# Patient Record
Sex: Female | Born: 1950 | Race: White | Hispanic: No | Marital: Single | Smoking: Never smoker
Health system: Southern US, Community
[De-identification: ages and names within clinical notes are randomized; demographics above are authoritative.]

## PROBLEM LIST (undated history)

## (undated) DIAGNOSIS — B019 Varicella without complication: Secondary | ICD-10-CM

## (undated) DIAGNOSIS — K5792 Diverticulitis of intestine, part unspecified, without perforation or abscess without bleeding: Secondary | ICD-10-CM

## (undated) DIAGNOSIS — F32A Depression, unspecified: Secondary | ICD-10-CM

## (undated) DIAGNOSIS — M1990 Unspecified osteoarthritis, unspecified site: Secondary | ICD-10-CM

## (undated) DIAGNOSIS — K5790 Diverticulosis of intestine, part unspecified, without perforation or abscess without bleeding: Secondary | ICD-10-CM

## (undated) DIAGNOSIS — E785 Hyperlipidemia, unspecified: Secondary | ICD-10-CM

## (undated) DIAGNOSIS — F419 Anxiety disorder, unspecified: Secondary | ICD-10-CM

## (undated) DIAGNOSIS — T7840XA Allergy, unspecified, initial encounter: Secondary | ICD-10-CM

## (undated) HISTORY — DX: Depression, unspecified: F32.A

## (undated) HISTORY — DX: Varicella without complication: B01.9

## (undated) HISTORY — DX: Diverticulosis of intestine, part unspecified, without perforation or abscess without bleeding: K57.90

## (undated) HISTORY — PX: AUGMENTATION MAMMAPLASTY: SUR837

## (undated) HISTORY — DX: Allergy, unspecified, initial encounter: T78.40XA

## (undated) HISTORY — DX: Unspecified osteoarthritis, unspecified site: M19.90

## (undated) HISTORY — DX: Anxiety disorder, unspecified: F41.9

## (undated) HISTORY — DX: Diverticulitis of intestine, part unspecified, without perforation or abscess without bleeding: K57.92

## (undated) HISTORY — DX: Hyperlipidemia, unspecified: E78.5

---

## 2011-07-07 DIAGNOSIS — I890 Lymphedema, not elsewhere classified: Secondary | ICD-10-CM | POA: Insufficient documentation

## 2012-02-09 DIAGNOSIS — M779 Enthesopathy, unspecified: Secondary | ICD-10-CM | POA: Insufficient documentation

## 2012-04-14 DIAGNOSIS — F329 Major depressive disorder, single episode, unspecified: Secondary | ICD-10-CM | POA: Insufficient documentation

## 2012-04-14 DIAGNOSIS — N951 Menopausal and female climacteric states: Secondary | ICD-10-CM | POA: Insufficient documentation

## 2012-04-14 DIAGNOSIS — B351 Tinea unguium: Secondary | ICD-10-CM | POA: Insufficient documentation

## 2014-04-22 ENCOUNTER — Encounter (HOSPITAL_BASED_OUTPATIENT_CLINIC_OR_DEPARTMENT_OTHER): Payer: Self-pay | Admitting: Emergency Medicine

## 2014-04-22 ENCOUNTER — Emergency Department (HOSPITAL_BASED_OUTPATIENT_CLINIC_OR_DEPARTMENT_OTHER)
Admission: EM | Admit: 2014-04-22 | Discharge: 2014-04-22 | Disposition: A | Discharge status: Home / Self Care | Payer: 59 | Attending: Emergency Medicine | Admitting: Emergency Medicine

## 2014-04-22 DIAGNOSIS — M545 Low back pain, unspecified: Secondary | ICD-10-CM | POA: Insufficient documentation

## 2014-04-22 DIAGNOSIS — M549 Dorsalgia, unspecified: Secondary | ICD-10-CM

## 2014-04-22 DIAGNOSIS — R1031 Right lower quadrant pain: Secondary | ICD-10-CM | POA: Insufficient documentation

## 2014-04-22 LAB — URINALYSIS, ROUTINE W REFLEX MICROSCOPIC
BILIRUBIN URINE: NEGATIVE
GLUCOSE, UA: NEGATIVE mg/dL
Hgb urine dipstick: NEGATIVE
KETONES UR: NEGATIVE mg/dL
Leukocytes, UA: NEGATIVE
Nitrite: NEGATIVE
PH: 7 (ref 5.0–8.0)
Protein, ur: NEGATIVE mg/dL
SPECIFIC GRAVITY, URINE: 1.005 (ref 1.005–1.030)
Urobilinogen, UA: 0.2 mg/dL (ref 0.0–1.0)

## 2014-04-22 NOTE — Discharge Instructions (Signed)
Back Pain, Adult Low back pain is very common. About 1 in 5 people have back pain.The cause of low back pain is rarely dangerous. The pain often gets better over time.About half of people with a sudden onset of back pain feel better in just 2 weeks. About 8 in 10 people feel better by 6 weeks.  CAUSES Some common causes of back pain include:  Strain of the muscles or ligaments supporting the spine.  Wear and tear (degeneration) of the spinal discs.  Arthritis.  Direct injury to the back. DIAGNOSIS Most of the time, the direct cause of low back pain is not known.However, back pain can be treated effectively even when the exact cause of the pain is unknown.Answering your caregiver's questions about your overall health and symptoms is one of the most accurate ways to make sure the cause of your pain is not dangerous. If your caregiver needs more information, he or she may order lab work or imaging tests (X-rays or MRIs).However, even if imaging tests show changes in your back, this usually does not require surgery. HOME CARE INSTRUCTIONS For many people, back pain returns.Since low back pain is rarely dangerous, it is often a condition that people can learn to manageon their own.   Remain active. It is stressful on the back to sit or stand in one place. Do not sit, drive, or stand in one place for more than 30 minutes at a time. Take short walks on level surfaces as soon as pain allows.Try to increase the length of time you walk each day.  Do not stay in bed.Resting more than 1 or 2 days can delay your recovery.  Do not avoid exercise or work.Your body is made to move.It is not dangerous to be active, even though your back may hurt.Your back will likely heal faster if you return to being active before your pain is gone.  Pay attention to your body when you bend and lift. Many people have less discomfortwhen lifting if they bend their knees, keep the load close to their bodies,and  avoid twisting. Often, the most comfortable positions are those that put less stress on your recovering back.  Find a comfortable position to sleep. Use a firm mattress and lie on your side with your knees slightly bent. If you lie on your back, put a pillow under your knees.  Only take over-the-counter or prescription medicines as directed by your caregiver. Over-the-counter medicines to reduce pain and inflammation are often the most helpful.Your caregiver may prescribe muscle relaxant drugs.These medicines help dull your pain so you can more quickly return to your normal activities and healthy exercise.  Put ice on the injured area.  Put ice in a plastic bag.  Place a towel between your skin and the bag.  Leave the ice on for 15-20 minutes, 03-04 times a day for the first 2 to 3 days. After that, ice and heat may be alternated to reduce pain and spasms.  Ask your caregiver about trying back exercises and gentle massage. This may be of some benefit.  Avoid feeling anxious or stressed.Stress increases muscle tension and can worsen back pain.It is important to recognize when you are anxious or stressed and learn ways to manage it.Exercise is a great option. SEEK MEDICAL CARE IF:  You have pain that is not relieved with rest or medicine.  You have pain that does not improve in 1 week.  You have new symptoms.  You are generally not feeling well. SEEK   IMMEDIATE MEDICAL CARE IF:   You have pain that radiates from your back into your legs.  You develop new bowel or bladder control problems.  You have unusual weakness or numbness in your arms or legs.  You develop nausea or vomiting.  You develop abdominal pain.  You feel faint. Document Released: 11/17/2005 Document Revised: 05/18/2012 Document Reviewed: 04/07/2011 [employer] Patient Information 2014 [employer].  

## 2014-04-22 NOTE — ED Notes (Signed)
TV on, Warm Blanket Provided.

## 2014-04-22 NOTE — ED Notes (Signed)
Pt reports urinary frequency, and (L) flank pain x 1 week.  deneis fever.

## 2014-04-22 NOTE — ED Provider Notes (Signed)
CSN: [id number]     Arrival date & time 04/22/14  1204 History   First MD Initiated Contact with Patient 04/22/14 1232     Chief Complaint  Patient presents with  . Back Pain     (Consider location/radiation/quality/duration/timing/severity/associated sxs/prior Treatment) Patient is a 63 y.o. female presenting with back pain.  Back Pain Location:  Lumbar spine Quality:  Aching Radiates to: left low back. Pain severity:  Moderate Onset quality:  Gradual Duration:  6 days Timing:  Constant Progression:  Worsening Chronicity:  New Context: not falling, not MVA and not recent injury   Relieved by:  NSAIDs, heating pad and being still Worsened by:  Movement Associated symptoms: abdominal pain   Associated symptoms: no bladder incontinence, no bowel incontinence, no chest pain, no dysuria, no fever, no numbness, no perianal numbness and no tingling     History reviewed. No pertinent past medical history. History reviewed. No pertinent past surgical history. History reviewed. No pertinent family history. History  Substance Use Topics  . Smoking status: Never Smoker   . Smokeless tobacco: Not on file  . Alcohol Use: No   OB History   Grav Para Term Preterm Abortions TAB SAB Ect Mult Living                 Review of Systems  Constitutional: Negative for fever.  HENT: Negative for congestion.   Respiratory: Negative for cough and shortness of breath.   Cardiovascular: Negative for chest pain.  Gastrointestinal: Positive for abdominal pain. Negative for nausea, vomiting, diarrhea and bowel incontinence.  Genitourinary: Negative for bladder incontinence and dysuria.  Musculoskeletal: Positive for back pain.  Neurological: Negative for tingling and numbness.  All other systems reviewed and are negative.     Allergies  Review of patient's allergies indicates no known allergies.  Home Medications   Prior to Admission medications   Not on File   BP 130/66  Pulse 82   Temp(Src) 98.6 F (37 C) (Oral)  Resp 16  Ht 5\' 2"  (1.575 m)  Wt 120 lb (54.432 kg)  BMI 21.94 kg/m2  SpO2 100% Physical Exam  Nursing note and vitals reviewed. Constitutional: She is oriented to person, place, and time. She appears well-developed and well-nourished. No distress.  HENT:  Head: Normocephalic and atraumatic.  Mouth/Throat: Oropharynx is clear and moist.  Eyes: Conjunctivae are normal. Pupils are equal, round, and reactive to light. No scleral icterus.  Neck: Neck supple.  Cardiovascular: Normal rate, regular rhythm, normal heart sounds and intact distal pulses.   No murmur heard. Pulmonary/Chest: Effort normal and breath sounds normal. No stridor. No respiratory distress. She has no rales.  Abdominal: Soft. Bowel sounds are normal. She exhibits no distension. There is no tenderness. There is no rigidity, no rebound and no guarding.  Musculoskeletal: Normal range of motion.       Lumbar back: She exhibits normal range of motion, no tenderness, no bony tenderness, no swelling, no edema and no deformity.  Neurological: She is alert and oriented to person, place, and time.  Skin: Skin is warm and dry. No rash noted.  Psychiatric: She has a normal mood and affect. Her behavior is normal.    ED Course  Procedures (including critical care time) Labs Review Labs Reviewed  URINALYSIS, ROUTINE W REFLEX MICROSCOPIC    Imaging Review No results found.   EKG Interpretation None      MDM   Final diagnoses:  Back pain    63 yo female with  about 6 days of low back pain.  Began as midline pain, then moved to left low back.  She has also begun to have some RLQ abdominal cramping pain.  On exam, well appearing, not distressed, no back TTP, minimal epigastric abdominal TTP, but no lower abd TTP.  UA negative for blood or infection.  No red flag signs or symptoms for significant back pain pathology.  Normal gait and strength.  Exam very reassuring.  We discussed possible  emergent etiologies of pain and discussed lab testing and imaging.  Through mutual decision making, we decided not to pursue further testing at this time, but for her to monitor symptoms, return if worsening, and follow up with PCP.      Houston Siren III, MD 04/22/14 [phone]

## 2017-01-02 DIAGNOSIS — L570 Actinic keratosis: Secondary | ICD-10-CM | POA: Diagnosis not present

## 2017-01-02 DIAGNOSIS — L820 Inflamed seborrheic keratosis: Secondary | ICD-10-CM | POA: Diagnosis not present

## 2017-01-02 DIAGNOSIS — L814 Other melanin hyperpigmentation: Secondary | ICD-10-CM | POA: Diagnosis not present

## 2017-01-02 DIAGNOSIS — D235 Other benign neoplasm of skin of trunk: Secondary | ICD-10-CM | POA: Diagnosis not present

## 2017-01-02 DIAGNOSIS — L821 Other seborrheic keratosis: Secondary | ICD-10-CM | POA: Diagnosis not present

## 2017-02-12 DIAGNOSIS — N760 Acute vaginitis: Secondary | ICD-10-CM | POA: Diagnosis not present

## 2017-02-12 DIAGNOSIS — B9689 Other specified bacterial agents as the cause of diseases classified elsewhere: Secondary | ICD-10-CM | POA: Diagnosis not present

## 2017-02-12 DIAGNOSIS — R8290 Unspecified abnormal findings in urine: Secondary | ICD-10-CM | POA: Diagnosis not present

## 2017-02-12 DIAGNOSIS — E785 Hyperlipidemia, unspecified: Secondary | ICD-10-CM | POA: Insufficient documentation

## 2017-02-20 DIAGNOSIS — Z0000 Encounter for general adult medical examination without abnormal findings: Secondary | ICD-10-CM | POA: Diagnosis not present

## 2017-02-21 LAB — BASIC METABOLIC PANEL
BUN: 12 (ref 4–21)
CREATININE: 0.7 (ref 0.5–1.1)
Glucose: 90
POTASSIUM: 4.5 (ref 3.4–5.3)
SODIUM: 141 (ref 137–147)

## 2017-02-21 LAB — CBC AND DIFFERENTIAL
HCT: 43 (ref 36–46)
Hemoglobin: 14.7 (ref 12.0–16.0)
PLATELETS: 296 (ref 150–399)
WBC: 6

## 2017-02-21 LAB — HEPATIC FUNCTION PANEL
ALT: 28 (ref 7–35)
AST: 28 (ref 13–35)
Alkaline Phosphatase: 91 (ref 25–125)
Bilirubin, Total: 0.4

## 2017-02-21 LAB — LIPID PANEL
Cholesterol: 229 — AB (ref 0–200)
HDL: 83 — AB (ref 35–70)
LDL CALC: 126
Triglycerides: 73 (ref 40–160)

## 2017-02-21 LAB — TSH: TSH: 1.82 (ref 0.41–5.90)

## 2017-02-22 DIAGNOSIS — J111 Influenza due to unidentified influenza virus with other respiratory manifestations: Secondary | ICD-10-CM | POA: Diagnosis not present

## 2017-02-22 DIAGNOSIS — R05 Cough: Secondary | ICD-10-CM | POA: Diagnosis not present

## 2017-02-24 DIAGNOSIS — R05 Cough: Secondary | ICD-10-CM | POA: Diagnosis not present

## 2017-02-24 DIAGNOSIS — B9789 Other viral agents as the cause of diseases classified elsewhere: Secondary | ICD-10-CM | POA: Diagnosis not present

## 2017-02-24 DIAGNOSIS — J069 Acute upper respiratory infection, unspecified: Secondary | ICD-10-CM | POA: Diagnosis not present

## 2017-03-26 DIAGNOSIS — K5900 Constipation, unspecified: Secondary | ICD-10-CM | POA: Diagnosis not present

## 2017-03-26 DIAGNOSIS — K644 Residual hemorrhoidal skin tags: Secondary | ICD-10-CM | POA: Diagnosis not present

## 2017-03-26 DIAGNOSIS — L290 Pruritus ani: Secondary | ICD-10-CM | POA: Diagnosis not present

## 2017-04-24 ENCOUNTER — Ambulatory Visit: Payer: Self-pay | Admitting: Podiatry

## 2017-06-10 ENCOUNTER — Ambulatory Visit (INDEPENDENT_AMBULATORY_CARE_PROVIDER_SITE_OTHER): Payer: BLUE CROSS/BLUE SHIELD

## 2017-06-10 ENCOUNTER — Ambulatory Visit (INDEPENDENT_AMBULATORY_CARE_PROVIDER_SITE_OTHER): Payer: BLUE CROSS/BLUE SHIELD | Admitting: Podiatry

## 2017-06-10 ENCOUNTER — Encounter: Payer: Self-pay | Admitting: Podiatry

## 2017-06-10 VITALS — BP 129/86

## 2017-06-10 DIAGNOSIS — M779 Enthesopathy, unspecified: Secondary | ICD-10-CM | POA: Diagnosis not present

## 2017-06-10 DIAGNOSIS — M722 Plantar fascial fibromatosis: Secondary | ICD-10-CM | POA: Diagnosis not present

## 2017-06-10 DIAGNOSIS — M79672 Pain in left foot: Secondary | ICD-10-CM

## 2017-06-10 DIAGNOSIS — M79671 Pain in right foot: Secondary | ICD-10-CM

## 2017-06-10 MED ORDER — TRIAMCINOLONE ACETONIDE 10 MG/ML IJ SUSP
10.0000 mg | Freq: Once | INTRAMUSCULAR | Status: AC
  Administered 2017-06-10: 10 mg

## 2017-06-10 NOTE — Progress Notes (Signed)
Subjective:    Patient ID: Danielle Fitzpatrick, female   DOB: 66 y.o.   MRN: [id number]   HPI patient states that she has had a history of foot surgery bilateral and she's developed a lot of pain in the plantar right heel and the dorsum of the left foot and also knows that she has flatfoot and had orthotics years ago that she did not like    Review of Systems  All other systems reviewed and are negative.       Objective:  Physical Exam  Cardiovascular: Intact distal pulses.   Musculoskeletal: Normal range of motion.  Neurological: She is alert.  Skin: Skin is warm.  Nursing note and vitals reviewed.  neurovascular status intact muscle strength adequate range of motion within normal limits with patient noted to have quite a bit of discomfort in the center center lateral aspect of the plantar fascia right at its insertion calcaneus with the left showing inflammation of the midtarsal joint dorsal with fluid buildup noted around the midtarsal joint. Patient's found have good digital perfusion well oriented 3     Assessment:   Acute plantar fasciitis right mid and central band with patient noted to have dorsal tendinitis left midfoot that's painful when palpated     Plan:  H&P conditions reviewed and I injected the plantar fascia right 3 mg Kenalog 5 mg Xylocaine applied fascial brace right and for the left I injected the dorsal tendon complex extensor 3 mg Kenalog 5 mg Xylocaine advised on heat ice therapy discussed possibility for a very soft orthotic and reappoint in 3 weeks to reevaluate  X-rays indicate there is plantar spur there is depression of the arch bilateral with history of bunionectomy bilateral and 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

## 2017-06-10 NOTE — Progress Notes (Signed)
   Subjective:    Patient ID: Danielle Fitzpatrick, female    DOB: 1951/10/31, 66 y.o.   MRN: [id number]  HPI  Chief Complaint  Patient presents with  . Foot Pain    Lt top of the and heel pain for 1 month.     Review of Systems  All other systems reviewed and are negative.      Objective:   Physical Exam        Assessment & Plan:

## 2017-06-17 DIAGNOSIS — R600 Localized edema: Secondary | ICD-10-CM | POA: Insufficient documentation

## 2017-06-17 DIAGNOSIS — K648 Other hemorrhoids: Secondary | ICD-10-CM | POA: Insufficient documentation

## 2017-06-17 DIAGNOSIS — F419 Anxiety disorder, unspecified: Secondary | ICD-10-CM | POA: Insufficient documentation

## 2017-10-01 ENCOUNTER — Ambulatory Visit (INDEPENDENT_AMBULATORY_CARE_PROVIDER_SITE_OTHER): Payer: BLUE CROSS/BLUE SHIELD | Admitting: Podiatry

## 2017-10-01 DIAGNOSIS — M722 Plantar fascial fibromatosis: Secondary | ICD-10-CM

## 2017-10-01 DIAGNOSIS — M779 Enthesopathy, unspecified: Secondary | ICD-10-CM | POA: Diagnosis not present

## 2017-10-01 NOTE — Progress Notes (Signed)
Subjective:    Patient ID: Danielle Fitzpatrick, female   DOB: 66 y.o.   MRN: [id number]   HPI patient presents stating I started develop pain in my right heel again over the last month the top of my left foot has been doing well    ROS      Objective:  Physical Exam neurovascular status intact with discomfort in the plantar heel right that's moderate in its intensity with diminished fat pad and a thin heel and dorsum left foot doing okay     Assessment:    Plantar fasciitis reoccurrence right     Plan:    Discussed customized orthotic devices which I think will be of great benefit to her and at this point reviewed what would be required have recommended a very soft type orthotic. She will have molds done after insurance is contact

## 2017-10-08 ENCOUNTER — Telehealth: Payer: Self-pay | Admitting: Podiatry

## 2017-10-08 NOTE — Telephone Encounter (Signed)
Left message for pt to call to discuss orthotic benefits.  Pt called back and was given the coverage information and is going to think about it and let me know. She said she has tried over the counter and they hurt her feet. I also told pt if she decides she does not want them to schedule an appt with Dr Paulla Dolly to see if he may have another option for her.She agreed to call back and let me know.

## 2017-11-03 DIAGNOSIS — L578 Other skin changes due to chronic exposure to nonionizing radiation: Secondary | ICD-10-CM | POA: Diagnosis not present

## 2017-11-03 DIAGNOSIS — L570 Actinic keratosis: Secondary | ICD-10-CM | POA: Diagnosis not present

## 2018-01-20 DIAGNOSIS — D485 Neoplasm of uncertain behavior of skin: Secondary | ICD-10-CM | POA: Diagnosis not present

## 2018-01-20 DIAGNOSIS — C44629 Squamous cell carcinoma of skin of left upper limb, including shoulder: Secondary | ICD-10-CM | POA: Diagnosis not present

## 2018-01-20 DIAGNOSIS — D225 Melanocytic nevi of trunk: Secondary | ICD-10-CM | POA: Diagnosis not present

## 2018-01-20 DIAGNOSIS — L820 Inflamed seborrheic keratosis: Secondary | ICD-10-CM | POA: Diagnosis not present

## 2018-02-16 DIAGNOSIS — C44629 Squamous cell carcinoma of skin of left upper limb, including shoulder: Secondary | ICD-10-CM | POA: Diagnosis not present

## 2018-02-16 DIAGNOSIS — L820 Inflamed seborrheic keratosis: Secondary | ICD-10-CM | POA: Diagnosis not present

## 2018-03-16 ENCOUNTER — Telehealth: Payer: Self-pay | Admitting: Family Medicine

## 2018-03-16 NOTE — Telephone Encounter (Signed)
Received records from pt as she will be coming to establish care with [place].  These records are from [facility] so some records available in [place]

## 2018-03-19 NOTE — Progress Notes (Signed)
[place] [facility] [address], [place], [place] [address] [phone]  Date:  03/22/2018   Name:  Danielle Fitzpatrick   DOB:  [birth year]   MRN:  [id number]  PCP:  Darreld Mclean, MD    Chief Complaint: New Patient (Initial Visit) (establish care); Ear Issues (left ear, feels very senstaive to noise); and Vaginal Issues (frequent yeast infections/bacterial vaginosis)   History of Present Illness:  Danielle Fitzpatrick is a 67 y.o. very pleasant female patient who presents with the following:  Here today as a new patient, I got her old records from [place] last week  Not a year since her last physical so this is an establish care visit   She liked her last MD decided to start coming here as the location is convenient for her- she lives nearby  Former pt of Dr. Benjamine Mola, per her last note:  2. Anxiety Improved on higher dose without side effects Patient would like to be able to adjust dose up and down according to her stress level Discussed how to do that - FLUoxetine (PROZAC) 10 MG capsule; One tab twice per day Dispense: 60 capsule; Refill: 3 3. Encounter for gynecological examination/Screening for cervical cancer Pap smear sent - Liquid Based Pap,Cervical,With HPV Testi 4. Post-menopausal atrophic vaginitis Use and side effects discussed, sample given - conjugated estrogens (PREMARIN) 0.625 mg/gram vaginal cream; Apply as instructed Dispense: 5 g; Refill: 0 If desires a prescription can call the office, recommended that she check her formulary to see which preparation is preferred on her insurance   She plans to continue working for several years more, she is an Chiropractor.  She works for [employer] here in [place] Both of her adult children live in this area, she has 4 grands ages 74, 52, and 54 yo twins.  She really loves being near to her family and spends time with her grands in her free time. She also enjoys gardening   She takes  spiro 50 three times a week for swelling, not for HTN She is on prozac for stress- she had to change jobs recently and has found that prozac really helped her.  She usually take 10 mg a day but sometimes will use 20 depending on her stress level   She is due for a mammogram and dexa scan; will arrange for her She plans to come in for a CPE later this summer She was given some premarin cream in the past but did not really take it.  She is bothered by vaginal dryness and sensitivity, and some odor. She is not SA No history of cancer, DVT/ PE or post- menopausal bleeding. Discussed a low dose vaginal estrogen and she is interested in using this.  Discussed risks of systemic estrogen and the lack of RCTs proving that vaginal estrogen is safe- however we believe it does not carry the same risks due to lack of elevation of serum estrogen with low dose local estrogen   There are no active problems to display for this patient.   Past Medical History:  Diagnosis Date  . Anxiety   . Chicken pox   . Hyperlipidemia     No past surgical history on file.  Social History   Tobacco Use  . Smoking status: Never Smoker  . Smokeless tobacco: Never Used  Substance Use Topics  . Alcohol use: No  . Drug use: No    No family history on  file.  No Known Allergies  Medication list has been reviewed and updated.  Current Outpatient Medications on File Prior to Visit  Medication Sig Dispense Refill  . aspirin EC 81 MG tablet Take 81 mg by mouth.    . B Complex Vitamins (VITAMIN-B COMPLEX PO) Take by mouth.    . Cholecalciferol (VITAMIN D) 2000 units CAPS Take by mouth.    Marland Kitchen FLUoxetine (PROZAC) 10 MG capsule TAKE 1 CAPSULE BY MOUTH TWICE DAILY    . Magnesium 300 MG CAPS Take by mouth.    . Probiotic Product (PROBIOTIC-10 PO) Take by mouth.    . spironolactone (ALDACTONE) 50 MG tablet spironolactone 50 mg tablet three times weekly     No current facility-administered medications on file prior to  visit.     Review of Systems:  As per HPI- otherwise negative. No fever or chills   Physical Examination: Vitals:   03/22/18 1458  BP: 105/70  Pulse: 77  Resp: 16  SpO2: 94%   Vitals:   03/22/18 1458  Weight: 131 lb 3.2 oz (59.5 kg)  Height: 5' 1.5" (1.562 m)   Body mass index is 24.39 kg/m. Ideal Body Weight: Weight in (lb) to have BMI = 25: 134.2  GEN: WDWN, NAD, Non-toxic, A & O x 3, slim build, looks well  HEENT: Atraumatic, Normocephalic. Neck supple. No masses, No LAD.  Bilateral TM wnl, oropharynx normal.  PEERL,EOMI.   Ears and Nose: No external deformity. CV: RRR, No M/G/R. No JVD. No thrill. No extra heart sounds. PULM: CTA B, no wheezes, crackles, rhonchi. No retractions. No resp. distress. No accessory muscle use. ABD: S, NT, ND, +BS. No rebound. No HSM. EXTR: No c/c/e NEURO Normal gait.  PSYCH: Normally interactive. Conversant. Not depressed or anxious appearing.  Calm demeanor.    Assessment and Plan: Adjustment disorder, unspecified type - Plan: FLUoxetine (PROZAC) 10 MG capsule  Screening for breast cancer - Plan: MM DIAG BREAST TOMO BILATERAL  Screening for osteoporosis - Plan: DG Bone Density  Estrogen deficiency - Plan: DG Bone Density  Atrophic vaginitis - Plan: Estradiol 10 MCG TABS vaginal tablet  New patient here today to establish care and discuss a few concerns Ordered mammogram and dexa for her today Low dose vaginal estrogen for her to use as needed up to 2x a week Refilled her prozac She does not need a refill of her spiro yet   Signed Lamar Blinks, MD

## 2018-03-22 ENCOUNTER — Ambulatory Visit (INDEPENDENT_AMBULATORY_CARE_PROVIDER_SITE_OTHER): Payer: BLUE CROSS/BLUE SHIELD | Admitting: Family Medicine

## 2018-03-22 ENCOUNTER — Encounter: Payer: Self-pay | Admitting: Family Medicine

## 2018-03-22 ENCOUNTER — Ambulatory Visit: Payer: Managed Care, Other (non HMO) | Admitting: Family Medicine

## 2018-03-22 VITALS — BP 105/70 | HR 77 | Resp 16 | Ht 61.5 in | Wt 131.2 lb

## 2018-03-22 DIAGNOSIS — Z1231 Encounter for screening mammogram for malignant neoplasm of breast: Secondary | ICD-10-CM | POA: Diagnosis not present

## 2018-03-22 DIAGNOSIS — F4320 Adjustment disorder, unspecified: Secondary | ICD-10-CM | POA: Diagnosis not present

## 2018-03-22 DIAGNOSIS — Z13820 Encounter for screening for osteoporosis: Secondary | ICD-10-CM | POA: Diagnosis not present

## 2018-03-22 DIAGNOSIS — Z1239 Encounter for other screening for malignant neoplasm of breast: Secondary | ICD-10-CM

## 2018-03-22 DIAGNOSIS — N952 Postmenopausal atrophic vaginitis: Secondary | ICD-10-CM | POA: Diagnosis not present

## 2018-03-22 DIAGNOSIS — E2839 Other primary ovarian failure: Secondary | ICD-10-CM | POA: Diagnosis not present

## 2018-03-22 MED ORDER — ESTRADIOL 10 MCG VA TABS: ORAL_TABLET | VAGINAL | 6 refills | Status: DC

## 2018-03-22 MED ORDER — FLUOXETINE HCL 10 MG PO CAPS: 10.0000 mg | ORAL_CAPSULE | Freq: Two times a day (BID) | ORAL | 3 refills | Status: DC

## 2018-03-22 NOTE — Patient Instructions (Addendum)
[facility] is my dentist and I love them!  It was very nice to meet you today I will set you up for a mammogram and bone density scan We will look for you for a physical in July or August For vaginal comfort, we will have you try a low dose vaginal estrogen.  Place 1 pill twice a week, and you can decrease to once a week if you like.

## 2018-03-25 ENCOUNTER — Encounter: Payer: Self-pay | Admitting: Family Medicine

## 2018-04-05 ENCOUNTER — Encounter: Payer: Self-pay | Admitting: Family Medicine

## 2018-06-18 NOTE — Progress Notes (Addendum)
[facility] [address] [place], [place] [address] [phone]  Date:  06/21/2018   Name:  Danielle Fitzpatrick   DOB:  [name] 31, 1951   MRN:  [id number]  PCP:  Darreld Mclean, MD    Chief Complaint: Annual Exam (needing lab work, paper filled out for work) and Gastroesophageal Reflux (indegestion, pain in jaw and right arm, 2 weeks ago, one episode)   History of Present Illness:  Danielle Fitzpatrick is a 67 y.o. very pleasant female patient who presents with the following:  Here today for a CPE Last seen here in April to establish care: She plans to continue working for several years more, she is an Chiropractor.  She works for [employer] here in [place] Both of her adult children live in this area, she has 4 grands ages 72, 43, and 1 yo twins.  She really loves being near to her family and spends time with her grands in her free time. She also enjoys gardening   She takes spiro 50 three times a week for swelling, not for HTN She is on prozac for stress- she had to change jobs recently and has found that prozac really helped her.  She usually take 10 mg a day but sometimes will use 20 depending on her stress level   She is due for a mammogram and dexa scan; will arrange for her She plans to come in for a CPE later this summer She was given some premarin cream in the past but did not really take it.  She is bothered by vaginal dryness and sensitivity, and some odor. She is not SA No history of cancer, DVT/ PE or post- menopausal bleeding. Discussed a low dose vaginal estrogen and she is interested in using this.  Discussed risks of systemic estrogen and the lack of RCTs proving that vaginal estrogen is safe- however we believe it does not carry the same risks due to lack of elevation of serum estrogen with low dose local estrogen   Mammo: today  Pap: done last year- she was told that this is no longer necessary  Colon: would like  to do cologuard Dexa: done today  Immun: prevnar due today Tetanus is UTD, she has zostavax in 2014 Labs:  Will do today  She is using vaginal estradiol a couple of times a week and does feel like it helps her She notes that about 2 weeks ago she was sitting at her desk when she started to feel "indigestion" which was an epigastric pain and that went up into her chest.  She stood up to try and relieve pressure She had pain into her right arm and jaw- which lasted only seconds  This has not happened again and the episode lasted a few seconds overall No history of exertional chest pain   She does walk about 20 minutes daily- however this is not that strenuous Otherwise no formal exercise program  No CP or SOB Her father has had strokes but no other known history of heart disease in her family  She had been taking spiro 50 three times a week for water retention- however she would like to try coming off this which is fine, she does not think she really needs it anymore   There have been some changes at her job and she is under less stress which is a positive for her  There are no active problems to display for  this patient.   Past Medical History:  Diagnosis Date  . Anxiety   . Chicken pox   . Hyperlipidemia     Past Surgical History:  Procedure Laterality Date  . AUGMENTATION MAMMAPLASTY      Social History   Tobacco Use  . Smoking status: Never Smoker  . Smokeless tobacco: Never Used  Substance Use Topics  . Alcohol use: Yes    Comment: occasionally  . Drug use: No    Family History  Problem Relation Age of Onset  . Depression Mother   . Hyperlipidemia Mother   . Hypertension Mother   . Hypertension Father   . Hyperlipidemia Father   . Heart disease Father   . Hearing loss Father   . Diabetes Father   . Depression Father   . Depression Sister   . Alcohol abuse Brother   . Depression Brother   . Early death Brother   . Heart attack Brother   . Hypertension  Brother   . Hyperlipidemia Daughter   . Hearing loss Maternal Grandmother   . Diabetes Maternal Grandfather   . Early death Maternal Grandfather   . Heart disease Maternal Grandfather   . Alcohol abuse Paternal Grandmother   . Early death Paternal Grandmother     No Known Allergies  Medication list has been reviewed and updated.  Current Outpatient Medications on File Prior to Visit  Medication Sig Dispense Refill  . aspirin EC 81 MG tablet Take 81 mg by mouth.    . B Complex Vitamins (VITAMIN-B COMPLEX PO) Take by mouth.    . Cholecalciferol (VITAMIN D) 2000 units CAPS Take by mouth.    . Estradiol 10 MCG TABS vaginal tablet Place 1 tablet vaginally up to 2x a week as needed to maintain vaginal comfort 8 tablet 6  . FLUoxetine (PROZAC) 10 MG capsule Take 1 capsule (10 mg total) by mouth 2 (two) times daily. 180 capsule 3  . Magnesium 300 MG CAPS Take by mouth.    . Probiotic Product (PROBIOTIC-10 PO) Take by mouth.    . spironolactone (ALDACTONE) 50 MG tablet spironolactone 50 mg tablet three times weekly     No current facility-administered medications on file prior to visit.     Review of Systems:  As per HPI- otherwise negative. No fever or chills Vaginal dryness is much better with her vaginal estrogen use    Physical Examination: Vitals:   06/21/18 0909  BP: 116/78  Pulse: 72  Resp: 16  Temp: 97.9 F (36.6 C)  SpO2: 99%   Vitals:   06/21/18 0909  Weight: 127 lb 12.8 oz (58 kg)  Height: 5' 1.5" (1.562 m)   Body mass index is 23.76 kg/m. Ideal Body Weight: Weight in (lb) to have BMI = 25: 134.2  GEN: WDWN, NAD, Non-toxic, A & O x 3, normal weight, looks well  HEENT: Atraumatic, Normocephalic. Neck supple. No masses, No LAD.  Bilateral TM wnl, oropharynx normal.  PEERL,EOMI.   Ears and Nose: No external deformity. CV: RRR, No M/G/R. No JVD. No thrill. No extra heart sounds. PULM: CTA B, no wheezes, crackles, rhonchi. No retractions. No resp. distress. No  accessory muscle use. ABD: S, NT, ND. No rebound. No HSM. EXTR: No c/c/e NEURO Normal gait.  PSYCH: Normally interactive. Conversant. Not depressed or anxious appearing.  Calm demeanor.   EKG: NSR, no ST changes or other concerning findings  Assessment and Plan: Physical exam  Immunization due - Plan: Pneumococcal conjugate vaccine 13-valent  IM  Encounter for hepatitis C screening test for low risk patient - Plan: Hepatitis C antibody  Screening for deficiency anemia - Plan: CBC  Screening for diabetes mellitus - Plan: Comprehensive metabolic panel, Hemoglobin A1c  Screening for hyperlipidemia - Plan: Lipid panel  Chest pain, unspecified type - Plan: EKG 12-Lead, Exercise Tolerance Test   will need to fax in health form for her when labs are in  CPE today Updated pneumonia vaccine Labs pending as above She had an episode of very atypical CP a couple of week ago- this was the only episode she has experienced. EKG today is normal Will set her up for a ETT for further risk stratification   Signed Lamar Blinks, MD  Results for orders placed or performed in visit on 06/21/18  CBC  Result Value Ref Range   WBC 5.1 4.0 - 10.5 K/uL   RBC 4.51 3.87 - 5.11 Mil/uL   Platelets 275.0 150.0 - 400.0 K/uL   Hemoglobin 14.3 12.0 - 15.0 g/dL   HCT 42.0 36.0 - 46.0 %   MCV 93.1 78.0 - 100.0 fl   MCHC 34.1 30.0 - 36.0 g/dL   RDW 12.9 11.5 - 15.5 %  Comprehensive metabolic panel  Result Value Ref Range   Sodium 140 135 - 145 mEq/L   Potassium 4.3 3.5 - 5.1 mEq/L   Chloride 102 96 - 112 mEq/L   CO2 32 19 - 32 mEq/L   Glucose, Bld 91 70 - 99 mg/dL   BUN 11 6 - 23 mg/dL   Creatinine, Ser 0.66 0.40 - 1.20 mg/dL   Total Bilirubin 0.5 0.2 - 1.2 mg/dL   Alkaline Phosphatase 80 39 - 117 U/L   AST 16 0 - 37 U/L   ALT 15 0 - 35 U/L   Total Protein 6.7 6.0 - 8.3 g/dL   Albumin 4.4 3.5 - 5.2 g/dL   Calcium 9.5 8.4 - 10.5 mg/dL   GFR 95.37 >60.00 mL/min  Hemoglobin A1c  Result Value  Ref Range   Hgb A1c MFr Bld 5.6 4.6 - 6.5 %  Lipid panel  Result Value Ref Range   Cholesterol 290 (H) 0 - 200 mg/dL   Triglycerides 69.0 0.0 - 149.0 mg/dL   HDL 75.80 >39.00 mg/dL   VLDL 13.8 0.0 - 40.0 mg/dL   LDL Cholesterol 200 (H) 0 - 99 mg/dL   Total CHOL/HDL Ratio 4    NonHDL 213.79     Calculated 10 yr CV risk at 7% Message to pt   Blood counts are normal Metabolic profile looks fine A1c is normal- no sign of diabetes I'm afraid your cholesterol is high.  With these numbers I calculated your estimated 10 year risk of cardiovascular disease at 7%, which is not too bad but perhaps higher than we want to see at your (young) age.  I would suggest that we start you on a cholesterol medication if you are willing Let me know what you think?   Also, I saw that your form also requests a waist measurement. Can you please measure your waist and send me the result here?  Thank you!

## 2018-06-21 ENCOUNTER — Encounter (HOSPITAL_BASED_OUTPATIENT_CLINIC_OR_DEPARTMENT_OTHER): Payer: Self-pay

## 2018-06-21 ENCOUNTER — Ambulatory Visit (HOSPITAL_BASED_OUTPATIENT_CLINIC_OR_DEPARTMENT_OTHER): Payer: BLUE CROSS/BLUE SHIELD

## 2018-06-21 ENCOUNTER — Ambulatory Visit (HOSPITAL_BASED_OUTPATIENT_CLINIC_OR_DEPARTMENT_OTHER)
Admission: RE | Admit: 2018-06-21 | Discharge: 2018-06-21 | Disposition: A | Discharge status: Home / Self Care | Payer: BLUE CROSS/BLUE SHIELD | Source: Ambulatory Visit | Attending: Family Medicine | Admitting: Family Medicine

## 2018-06-21 ENCOUNTER — Encounter: Payer: Self-pay | Admitting: Family Medicine

## 2018-06-21 ENCOUNTER — Ambulatory Visit (INDEPENDENT_AMBULATORY_CARE_PROVIDER_SITE_OTHER): Payer: BLUE CROSS/BLUE SHIELD | Admitting: Family Medicine

## 2018-06-21 VITALS — BP 116/78 | HR 72 | Temp 97.9°F | Resp 16 | Ht 61.5 in | Wt 127.8 lb

## 2018-06-21 DIAGNOSIS — E2839 Other primary ovarian failure: Secondary | ICD-10-CM | POA: Insufficient documentation

## 2018-06-21 DIAGNOSIS — Z1231 Encounter for screening mammogram for malignant neoplasm of breast: Secondary | ICD-10-CM | POA: Insufficient documentation

## 2018-06-21 DIAGNOSIS — Z1159 Encounter for screening for other viral diseases: Secondary | ICD-10-CM | POA: Diagnosis not present

## 2018-06-21 DIAGNOSIS — Z131 Encounter for screening for diabetes mellitus: Secondary | ICD-10-CM

## 2018-06-21 DIAGNOSIS — Z0000 Encounter for general adult medical examination without abnormal findings: Secondary | ICD-10-CM | POA: Diagnosis not present

## 2018-06-21 DIAGNOSIS — Z13820 Encounter for screening for osteoporosis: Secondary | ICD-10-CM | POA: Diagnosis not present

## 2018-06-21 DIAGNOSIS — Z780 Asymptomatic menopausal state: Secondary | ICD-10-CM | POA: Diagnosis not present

## 2018-06-21 DIAGNOSIS — Z23 Encounter for immunization: Secondary | ICD-10-CM | POA: Diagnosis not present

## 2018-06-21 DIAGNOSIS — Z13220 Encounter for screening for lipoid disorders: Secondary | ICD-10-CM

## 2018-06-21 DIAGNOSIS — Z130 Encounter for screening for diseases of the blood and blood-forming organs and certain disorders involving the immune mechanism: Secondary | ICD-10-CM | POA: Diagnosis not present

## 2018-06-21 DIAGNOSIS — R079 Chest pain, unspecified: Secondary | ICD-10-CM | POA: Diagnosis not present

## 2018-06-21 DIAGNOSIS — Z1239 Encounter for other screening for malignant neoplasm of breast: Secondary | ICD-10-CM

## 2018-06-21 DIAGNOSIS — N952 Postmenopausal atrophic vaginitis: Secondary | ICD-10-CM | POA: Diagnosis not present

## 2018-06-21 DIAGNOSIS — M85852 Other specified disorders of bone density and structure, left thigh: Secondary | ICD-10-CM | POA: Diagnosis not present

## 2018-06-21 LAB — COMPREHENSIVE METABOLIC PANEL
ALT: 15 U/L (ref 0–35)
AST: 16 U/L (ref 0–37)
Albumin: 4.4 g/dL (ref 3.5–5.2)
Alkaline Phosphatase: 80 U/L (ref 39–117)
BUN: 11 mg/dL (ref 6–23)
CHLORIDE: 102 meq/L (ref 96–112)
CO2: 32 meq/L (ref 19–32)
CREATININE: 0.66 mg/dL (ref 0.40–1.20)
Calcium: 9.5 mg/dL (ref 8.4–10.5)
GFR: 95.37 mL/min (ref >60.00)
Glucose, Bld: 91 mg/dL (ref 70–99)
Potassium: 4.3 mEq/L (ref 3.5–5.1)
SODIUM: 140 meq/L (ref 135–145)
Total Bilirubin: 0.5 mg/dL (ref 0.2–1.2)
Total Protein: 6.7 g/dL (ref 6.0–8.3)

## 2018-06-21 LAB — CBC
HEMATOCRIT: 42 % (ref 36.0–46.0)
HEMOGLOBIN: 14.3 g/dL (ref 12.0–15.0)
MCHC: 34.1 g/dL (ref 30.0–36.0)
MCV: 93.1 fl (ref 78.0–100.0)
Platelets: 275 10*3/uL (ref 150.0–400.0)
RBC: 4.51 Mil/uL (ref 3.87–5.11)
RDW: 12.9 % (ref 11.5–15.5)
WBC: 5.1 10*3/uL (ref 4.0–10.5)

## 2018-06-21 LAB — LIPID PANEL
Cholesterol: 290 mg/dL — ABNORMAL HIGH (ref 0–200)
HDL: 75.8 mg/dL (ref >39.00)
LDL Cholesterol: 200 mg/dL — ABNORMAL HIGH (ref 0–99)
NonHDL: 213.79
Total CHOL/HDL Ratio: 4
Triglycerides: 69 mg/dL (ref 0.0–149.0)
VLDL: 13.8 mg/dL (ref 0.0–40.0)

## 2018-06-21 LAB — HEMOGLOBIN A1C: HEMOGLOBIN A1C: 5.6 % (ref 4.6–6.5)

## 2018-06-21 MED ORDER — ESTRADIOL 10 MCG VA TABS: ORAL_TABLET | VAGINAL | 6 refills | Status: DC

## 2018-06-21 NOTE — Patient Instructions (Addendum)
Good to see you Danielle Fitzpatrick!  I will be in touch with your labs and also your mammo/ bone density asap You got your prenar 13 pneumonia shot today- will do a pneumovax booster next year and then done I will arrange for a treadmill test for you to check on your heart in more detail In any other concerning symptoms in the meantime please alert me We will also set you up for cologuard to screen for colon cancer  Your EKG looks great!    Health Maintenance for Postmenopausal Women Menopause is a normal process in which your reproductive ability comes to an end. This process happens gradually over a span of months to years, usually between the ages of 52 and 52. Menopause is complete when you have missed 12 consecutive menstrual periods. It is important to talk with your health care provider about some of the most common conditions that affect postmenopausal women, such as heart disease, cancer, and bone loss (osteoporosis). Adopting a healthy lifestyle and getting preventive care can help to promote your health and wellness. Those actions can also lower your chances of developing some of these common conditions. What should I know about menopause? During menopause, you may experience a number of symptoms, such as:  Moderate-to-severe hot flashes.  Night sweats.  Decrease in sex drive.  Mood swings.  Headaches.  Tiredness.  Irritability.  Memory problems.  Insomnia.  Choosing to treat or not to treat menopausal changes is an individual decision that you make with your health care provider. What should I know about hormone replacement therapy and supplements? Hormone therapy products are effective for treating symptoms that are associated with menopause, such as hot flashes and night sweats. Hormone replacement carries certain risks, especially as you become older. If you are thinking about using estrogen or estrogen with progestin treatments, discuss the benefits and risks with your health  care provider. What should I know about heart disease and stroke? Heart disease, heart attack, and stroke become more likely as you age. This may be due, in part, to the hormonal changes that your body experiences during menopause. These can affect how your body processes dietary fats, triglycerides, and cholesterol. Heart attack and stroke are both medical emergencies. There are many things that you can do to help prevent heart disease and stroke:  Have your blood pressure checked at least every 1-2 years. High blood pressure causes heart disease and increases the risk of stroke.  If you are 61-82 years old, ask your health care provider if you should take aspirin to prevent a heart attack or a stroke.  Do not use any tobacco products, including cigarettes, chewing tobacco, or electronic cigarettes. If you need help quitting, ask your health care provider.  It is important to eat a healthy diet and maintain a healthy weight. ? Be sure to include plenty of vegetables, fruits, low-fat dairy products, and lean protein. ? Avoid eating foods that are high in solid fats, added sugars, or salt (sodium).  Get regular exercise. This is one of the most important things that you can do for your health. ? Try to exercise for at least 150 minutes each week. The type of exercise that you do should increase your heart rate and make you sweat. This is known as moderate-intensity exercise. ? Try to do strengthening exercises at least twice each week. Do these in addition to the moderate-intensity exercise.  Know your numbers.Ask your health care provider to check your cholesterol and your blood glucose.  Continue to have your blood tested as directed by your health care provider.  What should I know about cancer screening? There are several types of cancer. Take the following steps to reduce your risk and to catch any cancer development as early as possible. Breast Cancer  Practice breast  self-awareness. ? This means understanding how your breasts normally appear and feel. ? It also means doing regular breast self-exams. Let your health care provider know about any changes, no matter how small.  If you are 33 or older, have a clinician do a breast exam (clinical breast exam or CBE) every year. Depending on your age, family history, and medical history, it may be recommended that you also have a yearly breast X-ray (mammogram).  If you have a family history of breast cancer, talk with your health care provider about genetic screening.  If you are at high risk for breast cancer, talk with your health care provider about having an MRI and a mammogram every year.  Breast cancer (BRCA) gene test is recommended for women who have family members with BRCA-related cancers. Results of the assessment will determine the need for genetic counseling and BRCA1 and for BRCA2 testing. BRCA-related cancers include these types: ? Breast. This occurs in males or females. ? Ovarian. ? Tubal. This may also be called fallopian tube cancer. ? Cancer of the abdominal or pelvic lining (peritoneal cancer). ? Prostate. ? Pancreatic.  Cervical, Uterine, and Ovarian Cancer Your health care provider may recommend that you be screened regularly for cancer of the pelvic organs. These include your ovaries, uterus, and vagina. This screening involves a pelvic exam, which includes checking for microscopic changes to the surface of your cervix (Pap test).  For women ages 21-65, health care providers may recommend a pelvic exam and a Pap test every three years. For women ages 49-65, they may recommend the Pap test and pelvic exam, combined with testing for human papilloma virus (HPV), every five years. Some types of HPV increase your risk of cervical cancer. Testing for HPV may also be done on women of any age who have unclear Pap test results.  Other health care providers may not recommend any screening for  nonpregnant women who are considered low risk for pelvic cancer and have no symptoms. Ask your health care provider if a screening pelvic exam is right for you.  If you have had past treatment for cervical cancer or a condition that could lead to cancer, you need Pap tests and screening for cancer for at least 20 years after your treatment. If Pap tests have been discontinued for you, your risk factors (such as having a new sexual partner) need to be reassessed to determine if you should start having screenings again. Some women have medical problems that increase the chance of getting cervical cancer. In these cases, your health care provider may recommend that you have screening and Pap tests more often.  If you have a family history of uterine cancer or ovarian cancer, talk with your health care provider about genetic screening.  If you have vaginal bleeding after reaching menopause, tell your health care provider.  There are currently no reliable tests available to screen for ovarian cancer.  Lung Cancer Lung cancer screening is recommended for adults 4-18 years old who are at high risk for lung cancer because of a history of smoking. A yearly low-dose CT scan of the lungs is recommended if you:  Currently smoke.  Have a history of at least  30 pack-years of smoking and you currently smoke or have quit within the past 15 years. A pack-year is smoking an average of one pack of cigarettes per day for one year.  Yearly screening should:  Continue until it has been 15 years since you quit.  Stop if you develop a health problem that would prevent you from having lung cancer treatment.  Colorectal Cancer  This type of cancer can be detected and can often be prevented.  Routine colorectal cancer screening usually begins at age 23 and continues through age 63.  If you have risk factors for colon cancer, your health care provider may recommend that you be screened at an earlier age.  If you  have a family history of colorectal cancer, talk with your health care provider about genetic screening.  Your health care provider may also recommend using home test kits to check for hidden blood in your stool.  A small camera at the end of a tube can be used to examine your colon directly (sigmoidoscopy or colonoscopy). This is done to check for the earliest forms of colorectal cancer.  Direct examination of the colon should be repeated every 5-10 years until age 59. However, if early forms of precancerous polyps or small growths are found or if you have a family history or genetic risk for colorectal cancer, you may need to be screened more often.  Skin Cancer  Check your skin from head to toe regularly.  Monitor any moles. Be sure to tell your health care provider: ? About any new moles or changes in moles, especially if there is a change in a mole's shape or color. ? If you have a mole that is larger than the size of a pencil eraser.  If any of your family members has a history of skin cancer, especially at a young age, talk with your health care provider about genetic screening.  Always use sunscreen. Apply sunscreen liberally and repeatedly throughout the day.  Whenever you are outside, protect yourself by wearing long sleeves, pants, a wide-brimmed hat, and sunglasses.  What should I know about osteoporosis? Osteoporosis is a condition in which bone destruction happens more quickly than new bone creation. After menopause, you may be at an increased risk for osteoporosis. To help prevent osteoporosis or the bone fractures that can happen because of osteoporosis, the following is recommended:  If you are 84-33 years old, get at least 1,000 mg of calcium and at least 600 mg of vitamin D per day.  If you are older than age 40 but younger than age 79, get at least 1,200 mg of calcium and at least 600 mg of vitamin D per day.  If you are older than age 87, get at least 1,200 mg of  calcium and at least 800 mg of vitamin D per day.  Smoking and excessive alcohol intake increase the risk of osteoporosis. Eat foods that are rich in calcium and vitamin D, and do weight-bearing exercises several times each week as directed by your health care provider. What should I know about how menopause affects my mental health? Depression may occur at any age, but it is more common as you become older. Common symptoms of depression include:  Low or sad mood.  Changes in sleep patterns.  Changes in appetite or eating patterns.  Feeling an overall lack of motivation or enjoyment of activities that you previously enjoyed.  Frequent crying spells.  Talk with your health care provider if you think  that you are experiencing depression. What should I know about immunizations? It is important that you get and maintain your immunizations. These include:  Tetanus, diphtheria, and pertussis (Tdap) booster vaccine.  Influenza every year before the flu season begins.  Pneumonia vaccine.  Shingles vaccine.  Your health care provider may also recommend other immunizations. This information is not intended to replace advice given to you by your health care provider. Make sure you discuss any questions you have with your health care provider. Document Released: 01/09/2006 Document Revised: 06/06/2016 Document Reviewed: 08/21/2015 Elsevier Interactive Patient Education  2018 Elsevier Inc.  

## 2018-06-22 LAB — HEPATITIS C ANTIBODY
Hepatitis C Ab: NONREACTIVE
SIGNAL TO CUT-OFF: 0.01 (ref <1.00)

## 2018-06-28 ENCOUNTER — Encounter: Payer: Self-pay | Admitting: Family Medicine

## 2018-06-29 ENCOUNTER — Ambulatory Visit (INDEPENDENT_AMBULATORY_CARE_PROVIDER_SITE_OTHER): Payer: BLUE CROSS/BLUE SHIELD

## 2018-06-29 ENCOUNTER — Encounter: Payer: Self-pay | Admitting: Family Medicine

## 2018-06-29 DIAGNOSIS — Z1212 Encounter for screening for malignant neoplasm of rectum: Secondary | ICD-10-CM | POA: Diagnosis not present

## 2018-06-29 DIAGNOSIS — R079 Chest pain, unspecified: Secondary | ICD-10-CM

## 2018-06-29 DIAGNOSIS — Z1211 Encounter for screening for malignant neoplasm of colon: Secondary | ICD-10-CM | POA: Diagnosis not present

## 2018-06-29 LAB — EXERCISE TOLERANCE TEST
CHL CUP MPHR: 155 {beats}/min
CHL RATE OF PERCEIVED EXERTION: 16
CSEPEDS: 0 s
Estimated workload: 10.1 METS
Exercise duration (min): 9 min
Peak HR: 155 {beats}/min
Percent HR: 100 %
Rest HR: 86 {beats}/min

## 2018-06-29 LAB — COLOGUARD

## 2018-07-06 ENCOUNTER — Encounter: Payer: Self-pay | Admitting: Family Medicine

## 2018-07-14 ENCOUNTER — Encounter: Payer: Self-pay | Admitting: Family Medicine

## 2018-08-23 DIAGNOSIS — Z23 Encounter for immunization: Secondary | ICD-10-CM | POA: Diagnosis not present

## 2018-09-02 DIAGNOSIS — L570 Actinic keratosis: Secondary | ICD-10-CM | POA: Diagnosis not present

## 2018-09-02 DIAGNOSIS — B079 Viral wart, unspecified: Secondary | ICD-10-CM | POA: Diagnosis not present

## 2018-09-02 DIAGNOSIS — Z85828 Personal history of other malignant neoplasm of skin: Secondary | ICD-10-CM | POA: Diagnosis not present

## 2018-09-02 DIAGNOSIS — D485 Neoplasm of uncertain behavior of skin: Secondary | ICD-10-CM | POA: Diagnosis not present

## 2018-09-02 DIAGNOSIS — L988 Other specified disorders of the skin and subcutaneous tissue: Secondary | ICD-10-CM | POA: Diagnosis not present

## 2018-09-02 DIAGNOSIS — L820 Inflamed seborrheic keratosis: Secondary | ICD-10-CM | POA: Diagnosis not present

## 2018-09-02 DIAGNOSIS — D225 Melanocytic nevi of trunk: Secondary | ICD-10-CM | POA: Diagnosis not present

## 2018-09-09 DIAGNOSIS — L820 Inflamed seborrheic keratosis: Secondary | ICD-10-CM | POA: Diagnosis not present

## 2018-09-09 DIAGNOSIS — L821 Other seborrheic keratosis: Secondary | ICD-10-CM | POA: Diagnosis not present

## 2018-09-09 DIAGNOSIS — B078 Other viral warts: Secondary | ICD-10-CM | POA: Diagnosis not present

## 2018-10-12 DIAGNOSIS — L503 Dermatographic urticaria: Secondary | ICD-10-CM | POA: Diagnosis not present

## 2018-10-12 DIAGNOSIS — L821 Other seborrheic keratosis: Secondary | ICD-10-CM | POA: Diagnosis not present

## 2018-10-12 DIAGNOSIS — L905 Scar conditions and fibrosis of skin: Secondary | ICD-10-CM | POA: Diagnosis not present

## 2019-01-10 ENCOUNTER — Telehealth: Payer: Self-pay

## 2019-01-10 ENCOUNTER — Encounter: Payer: Self-pay | Admitting: Family Medicine

## 2019-01-10 DIAGNOSIS — N952 Postmenopausal atrophic vaginitis: Secondary | ICD-10-CM

## 2019-01-10 DIAGNOSIS — L570 Actinic keratosis: Secondary | ICD-10-CM | POA: Insufficient documentation

## 2019-01-10 DIAGNOSIS — Z0279 Encounter for issue of other medical certificate: Secondary | ICD-10-CM

## 2019-01-10 MED ORDER — ESTRADIOL 10 MCG VA TABS: ORAL_TABLET | VAGINAL | 3 refills | Status: DC

## 2019-01-10 NOTE — Telephone Encounter (Signed)
Larkin Ina calling from [facility] called and stated that vagifem will be cover by insurance and would like to know if this could be called in instead. Please advise [phone]

## 2019-01-10 NOTE — Telephone Encounter (Signed)
Vagifem sent by Dr. Lorelei Pont.

## 2019-01-10 NOTE — Telephone Encounter (Signed)
Please advise 

## 2019-01-10 NOTE — Telephone Encounter (Signed)
PA initiated via Covermymeds; KEY: [id number]. Awaiting determination.

## 2019-01-22 DIAGNOSIS — N3001 Acute cystitis with hematuria: Secondary | ICD-10-CM | POA: Diagnosis not present

## 2019-01-22 DIAGNOSIS — Z3200 Encounter for pregnancy test, result unknown: Secondary | ICD-10-CM | POA: Diagnosis not present

## 2019-01-22 DIAGNOSIS — B373 Candidiasis of vulva and vagina: Secondary | ICD-10-CM | POA: Diagnosis not present

## 2019-01-22 DIAGNOSIS — R300 Dysuria: Secondary | ICD-10-CM | POA: Diagnosis not present

## 2019-01-25 ENCOUNTER — Encounter: Payer: Self-pay | Admitting: Medical

## 2019-01-25 ENCOUNTER — Ambulatory Visit: Payer: BLUE CROSS/BLUE SHIELD | Admitting: Medical

## 2019-01-25 NOTE — Progress Notes (Signed)
fo

## 2019-02-11 DIAGNOSIS — N390 Urinary tract infection, site not specified: Secondary | ICD-10-CM | POA: Diagnosis not present

## 2019-02-17 ENCOUNTER — Encounter: Payer: Self-pay | Admitting: Family Medicine

## 2019-02-26 DIAGNOSIS — R8290 Unspecified abnormal findings in urine: Secondary | ICD-10-CM | POA: Diagnosis not present

## 2019-03-17 NOTE — Patient Instructions (Signed)
Left not seen

## 2019-06-09 DIAGNOSIS — L821 Other seborrheic keratosis: Secondary | ICD-10-CM | POA: Diagnosis not present

## 2019-06-09 DIAGNOSIS — L820 Inflamed seborrheic keratosis: Secondary | ICD-10-CM | POA: Diagnosis not present

## 2019-06-09 DIAGNOSIS — C44519 Basal cell carcinoma of skin of other part of trunk: Secondary | ICD-10-CM | POA: Diagnosis not present

## 2019-06-09 DIAGNOSIS — B078 Other viral warts: Secondary | ICD-10-CM | POA: Diagnosis not present

## 2019-06-09 DIAGNOSIS — L814 Other melanin hyperpigmentation: Secondary | ICD-10-CM | POA: Diagnosis not present

## 2019-06-27 DIAGNOSIS — C44519 Basal cell carcinoma of skin of other part of trunk: Secondary | ICD-10-CM | POA: Diagnosis not present

## 2019-06-27 DIAGNOSIS — L820 Inflamed seborrheic keratosis: Secondary | ICD-10-CM | POA: Diagnosis not present

## 2019-08-02 DIAGNOSIS — Z85828 Personal history of other malignant neoplasm of skin: Secondary | ICD-10-CM | POA: Diagnosis not present

## 2019-08-02 DIAGNOSIS — L819 Disorder of pigmentation, unspecified: Secondary | ICD-10-CM | POA: Diagnosis not present

## 2019-08-02 DIAGNOSIS — L820 Inflamed seborrheic keratosis: Secondary | ICD-10-CM | POA: Diagnosis not present

## 2019-08-02 DIAGNOSIS — D485 Neoplasm of uncertain behavior of skin: Secondary | ICD-10-CM | POA: Diagnosis not present

## 2019-08-02 DIAGNOSIS — L814 Other melanin hyperpigmentation: Secondary | ICD-10-CM | POA: Diagnosis not present

## 2019-08-17 ENCOUNTER — Other Ambulatory Visit: Payer: Self-pay

## 2019-08-17 ENCOUNTER — Ambulatory Visit: Payer: BC Managed Care – PPO | Admitting: Podiatry

## 2019-08-31 DIAGNOSIS — M67472 Ganglion, left ankle and foot: Secondary | ICD-10-CM | POA: Diagnosis not present

## 2019-08-31 DIAGNOSIS — M76822 Posterior tibial tendinitis, left leg: Secondary | ICD-10-CM | POA: Diagnosis not present

## 2019-08-31 DIAGNOSIS — M898X7 Other specified disorders of bone, ankle and foot: Secondary | ICD-10-CM | POA: Diagnosis not present

## 2019-09-13 DIAGNOSIS — H00022 Hordeolum internum right lower eyelid: Secondary | ICD-10-CM | POA: Diagnosis not present

## 2019-09-23 DIAGNOSIS — J302 Other seasonal allergic rhinitis: Secondary | ICD-10-CM | POA: Diagnosis not present

## 2019-09-28 ENCOUNTER — Other Ambulatory Visit: Payer: Self-pay | Admitting: Family Medicine

## 2019-09-28 NOTE — Telephone Encounter (Signed)
Medication Refill - Medication: spironolactone (ALDACTONE) 50 MG tablet  (Agent: If yes, when and what did the pharmacy advise?)  Preferred Pharmacy (with phone number or street name):  [id number] - [place],  - [address] AT [place] [phone] (Phone) [phone] (Fax)

## 2019-09-28 NOTE — Telephone Encounter (Signed)
Requested medication (s) are due for refill today: yes  Requested medication (s) are on the active medication list: yes  Last refill: 06/10/2017  Future visit scheduled: no  Notes to clinic:  Last filled by historical provider    Requested Prescriptions  Pending Prescriptions Disp Refills   spironolactone (ALDACTONE) 50 MG tablet       Sig: spironolactone 50 mg tablet three times weekly     Cardiovascular: Diuretics - Aldosterone Antagonist Failed - 09/28/2019  9:12 AM      Failed - Cr in normal range and within 360 days    Creatinine, Ser  Date Value Ref Range Status  06/21/2018 0.66 0.40 - 1.20 mg/dL Final         Failed - K in normal range and within 360 days    Potassium  Date Value Ref Range Status  06/21/2018 4.3 3.5 - 5.1 mEq/L Final         Failed - Na in normal range and within 360 days    Sodium  Date Value Ref Range Status  06/21/2018 140 135 - 145 mEq/L Final  02/21/2017 141 137 - 147 Final         Failed - Valid encounter within last 6 months    Recent Outpatient Visits          8 months ago Erroneous encounter - disregard   [facility] [place], [place]   1 year ago Physical exam   [facility], Gay Filler, MD   1 year ago Adjustment disorder, unspecified type   [facility], [place]             Passed - Last BP in normal range    BP Readings from Last 1 Encounters:  01/25/19 118/71

## 2019-10-03 ENCOUNTER — Ambulatory Visit: Payer: BC Managed Care – PPO

## 2019-10-03 ENCOUNTER — Other Ambulatory Visit: Payer: Self-pay

## 2019-10-03 ENCOUNTER — Encounter: Payer: Self-pay | Admitting: Podiatry

## 2019-10-03 ENCOUNTER — Ambulatory Visit: Payer: BC Managed Care – PPO | Admitting: Podiatry

## 2019-10-03 DIAGNOSIS — L720 Epidermal cyst: Secondary | ICD-10-CM | POA: Insufficient documentation

## 2019-10-03 DIAGNOSIS — D485 Neoplasm of uncertain behavior of skin: Secondary | ICD-10-CM | POA: Insufficient documentation

## 2019-10-03 DIAGNOSIS — N951 Menopausal and female climacteric states: Secondary | ICD-10-CM | POA: Insufficient documentation

## 2019-10-03 DIAGNOSIS — M76822 Posterior tibial tendinitis, left leg: Secondary | ICD-10-CM

## 2019-10-03 DIAGNOSIS — M778 Other enthesopathies, not elsewhere classified: Secondary | ICD-10-CM

## 2019-10-03 DIAGNOSIS — J309 Allergic rhinitis, unspecified: Secondary | ICD-10-CM | POA: Insufficient documentation

## 2019-10-03 DIAGNOSIS — R059 Cough, unspecified: Secondary | ICD-10-CM | POA: Insufficient documentation

## 2019-10-03 DIAGNOSIS — R05 Cough: Secondary | ICD-10-CM | POA: Insufficient documentation

## 2019-10-03 MED ORDER — DICLOFENAC SODIUM 75 MG PO TBEC: 75.0000 mg | DELAYED_RELEASE_TABLET | Freq: Two times a day (BID) | ORAL | 2 refills | Status: DC

## 2019-10-03 MED ORDER — SPIRONOLACTONE 50 MG PO TABS: ORAL_TABLET | ORAL | 1 refills | Status: DC

## 2019-10-03 NOTE — Progress Notes (Signed)
Subjective:   Patient ID: Danielle Fitzpatrick, female   DOB: 68 y.o.   MRN: [id number]   HPI Patient states she is getting pain on the inside of the arch and its been present for around 6 months and does not remember specific injury and also gets some discomfort on top of the foot neuro   ROS      Objective:  Physical Exam  Vascular status intact negative Bevelyn Buckles' sign noted with patient found to have inflammation of the posterior tibial tendon left at its insertion into the navicular with moderate depression of the arch and mild extensor tendinitis left.  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  Reappoint 3 weeks or earlier if needed

## 2019-10-04 NOTE — Progress Notes (Signed)
[place] at [facility] [address] [place], [place] [address] [phone]  Date:  10/05/2019   Name:  Danielle Fitzpatrick   DOB:  1950-12-09   MRN:  [id number]  PCP:  Darreld Mclean, MD    Chief Complaint: No chief complaint on file.   History of Present Illness:  Danielle Fitzpatrick is a 68 y.o. very pleasant female patient who presents with the following:  Virtual visit today to discuss medication Patient location is home, provider is at office.  Patient identity confirmed with 2 factors, she gives consent for virtual visit today  History of anxiety, hyperlipidemia, depression Last seen by myself in July 2019, but that we have exchanged a few messages in the interim She has 2 adult children, 4 grandchildren who are local  She is living alone so she has been a bit down during the pandemic. She went back on prozac, this helped for a while but then it seemed to make her more anxious.  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 little too easy for her   Last year she had an episode of chest pain, we did an ETT which was negative She recently saw podiatry for some foot pain- they gave her some diclofenac gel and some oral NSAID that does help   Baby aspirin Vagifem- she stopped taking this, no longer needed  She takes Spironolactone 50mg  3 times weekly to prevent ankle swelling She tried stopping it but the swelling got worse again   Flu shot is done already Due for Pneumovax- reminded pt of this  Discussed shingrix with her- she has medicare but is not using it She will call [employer] and see where she should get this immunization   Patient Active Problem List   Diagnosis Date Noted  . Allergic rhinitis  10/03/2019  . Cough 10/03/2019  . Menopausal syndrome 10/03/2019  . Milia 10/03/2019  . 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 Anxiety 06/17/2017  . Bilateral leg edema 06/17/2017  . Other hemorrhoids 06/17/2017  . Hyperlipidemia 02/12/2017  . Symptomatic menopausal or female climacteric states 04/14/2012  . Onychomycosis due to dermatophyte 04/14/2012  . 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 Enthesopathy 02/09/2012  . Other lymphedema 07/07/2011    Past Medical History:  Diagnosis Date  . Anxiety   . Chicken pox   . Hyperlipidemia     Past Surgical History:  Procedure Laterality Date  . AUGMENTATION MAMMAPLASTY      Social History   Tobacco Use  . Smoking status: Never Smoker  . Smokeless tobacco: Never Used  Substance Use Topics  . Alcohol use: Yes    Comment: occasionally  . Drug use: No    Family History  Problem Relation Age of Onset  . Depression Mother   . Hyperlipidemia Mother   . Hypertension Mother   . Hypertension Father   . Hyperlipidemia Father   . Heart disease Father   . Hearing loss Father   . Diabetes Father   . Depression Father   . Depression Sister   . Alcohol abuse Brother   . Depression Brother   . Early death  Brother   . Heart attack Brother   . Hypertension Brother   . Hyperlipidemia Daughter   . Hearing loss Maternal Grandmother   . Diabetes Maternal Grandfather   . Early death Maternal Grandfather   . Heart disease Maternal Grandfather   . Alcohol abuse Paternal Grandmother   . Early death Paternal Grandmother     No Known Allergies  Medication list has been reviewed and updated.  Current Outpatient Medications on File Prior to Visit  Medication Sig Dispense Refill  . aspirin EC 81 MG tablet Take 81 mg by mouth.    . B Complex Vitamins (VITAMIN-B COMPLEX PO) Take by mouth.    . Cholecalciferol (VITAMIN D) 2000 units CAPS Take by mouth.    . diclofenac (VOLTAREN)  75 MG EC tablet Take 1 tablet (75 mg total) by mouth 2 (two) times daily. 50 tablet 2  . Estradiol (VAGIFEM) 10 MCG TABS vaginal tablet Place 1 tablet vaginally up to 2x a week as needed to maintain vaginal comfort 30 tablet 3  . Magnesium 300 MG CAPS Take by mouth.    . spironolactone (ALDACTONE) 50 MG tablet spironolactone 50 mg tablet three times weekly 90 tablet 1   No current facility-administered medications on file prior to visit.     Review of Systems:  As per HPI- otherwise negative. No CP or SOB No fever or chills  Physical Examination: There were no vitals filed for this visit. There were no vitals filed for this visit. There is no height or weight on file to calculate BMI. Ideal Body Weight:    Pt observed over video - she looks well No cough, SOB or wheezing  BP Readings from Last 3 Encounters:  01/25/19 118/71  06/21/18 116/78  03/22/18 105/70    Assessment and Plan: Bilateral leg edema - Plan: spironolactone (ALDACTONE) 100 MG tablet  Screening for deficiency anemia - Plan: CBC  Screening for diabetes mellitus - Plan: Hemoglobin A1c  Screening for hyperlipidemia - Plan: Lipid panel  Medication monitoring encounter - Plan: Comprehensive metabolic panel  Following up today Refilled her spiro - she takes 50 mg 3x a week generally to prevent ankle edema Discussed immunizations- she plans to update in the spring Ordered labs- she will come in for a lab only visit soon.  Asked my CMA to call her   Signed Lamar Blinks, MD

## 2019-10-05 ENCOUNTER — Ambulatory Visit (INDEPENDENT_AMBULATORY_CARE_PROVIDER_SITE_OTHER): Payer: BC Managed Care – PPO | Admitting: Family Medicine

## 2019-10-05 ENCOUNTER — Encounter: Payer: Self-pay | Admitting: Family Medicine

## 2019-10-05 ENCOUNTER — Other Ambulatory Visit: Payer: Self-pay

## 2019-10-05 DIAGNOSIS — Z130 Encounter for screening for diseases of the blood and blood-forming organs and certain disorders involving the immune mechanism: Secondary | ICD-10-CM | POA: Diagnosis not present

## 2019-10-05 DIAGNOSIS — Z13220 Encounter for screening for lipoid disorders: Secondary | ICD-10-CM | POA: Diagnosis not present

## 2019-10-05 DIAGNOSIS — R600 Localized edema: Secondary | ICD-10-CM

## 2019-10-05 DIAGNOSIS — Z131 Encounter for screening for diabetes mellitus: Secondary | ICD-10-CM

## 2019-10-05 DIAGNOSIS — Z5181 Encounter for therapeutic drug level monitoring: Secondary | ICD-10-CM

## 2019-10-05 MED ORDER — SPIRONOLACTONE 100 MG PO TABS: ORAL_TABLET | ORAL | 3 refills | Status: DC

## 2019-10-13 ENCOUNTER — Other Ambulatory Visit: Payer: BC Managed Care – PPO

## 2019-12-08 ENCOUNTER — Other Ambulatory Visit: Payer: BC Managed Care – PPO

## 2020-01-27 ENCOUNTER — Encounter: Payer: Self-pay | Admitting: Family Medicine

## 2020-01-27 ENCOUNTER — Other Ambulatory Visit (INDEPENDENT_AMBULATORY_CARE_PROVIDER_SITE_OTHER): Payer: BC Managed Care – PPO

## 2020-01-27 ENCOUNTER — Other Ambulatory Visit: Payer: Self-pay

## 2020-01-27 DIAGNOSIS — E785 Hyperlipidemia, unspecified: Secondary | ICD-10-CM

## 2020-01-27 DIAGNOSIS — Z130 Encounter for screening for diseases of the blood and blood-forming organs and certain disorders involving the immune mechanism: Secondary | ICD-10-CM

## 2020-01-27 DIAGNOSIS — Z5181 Encounter for therapeutic drug level monitoring: Secondary | ICD-10-CM

## 2020-01-27 DIAGNOSIS — Z131 Encounter for screening for diabetes mellitus: Secondary | ICD-10-CM | POA: Diagnosis not present

## 2020-01-27 DIAGNOSIS — Z13220 Encounter for screening for lipoid disorders: Secondary | ICD-10-CM | POA: Diagnosis not present

## 2020-01-27 LAB — COMPREHENSIVE METABOLIC PANEL
ALT: 14 U/L (ref 0–35)
AST: 17 U/L (ref 0–37)
Albumin: 4.2 g/dL (ref 3.5–5.2)
Alkaline Phosphatase: 95 U/L (ref 39–117)
BUN: 12 mg/dL (ref 6–23)
CO2: 26 mEq/L (ref 19–32)
Calcium: 9.3 mg/dL (ref 8.4–10.5)
Chloride: 103 mEq/L (ref 96–112)
Creatinine, Ser: 0.58 mg/dL (ref 0.40–1.20)
GFR: 103.65 mL/min (ref >60.00)
Glucose, Bld: 93 mg/dL (ref 70–99)
Potassium: 4 mEq/L (ref 3.5–5.1)
Sodium: 138 mEq/L (ref 135–145)
Total Bilirubin: 0.6 mg/dL (ref 0.2–1.2)
Total Protein: 6.4 g/dL (ref 6.0–8.3)

## 2020-01-27 LAB — LIPID PANEL
Cholesterol: 277 mg/dL — ABNORMAL HIGH (ref 0–200)
HDL: 73.1 mg/dL (ref >39.00)
LDL Cholesterol: 189 mg/dL — ABNORMAL HIGH (ref 0–99)
NonHDL: 203.68
Total CHOL/HDL Ratio: 4
Triglycerides: 71 mg/dL (ref 0.0–149.0)
VLDL: 14.2 mg/dL (ref 0.0–40.0)

## 2020-01-27 LAB — CBC
HCT: 39.9 % (ref 36.0–46.0)
Hemoglobin: 13.7 g/dL (ref 12.0–15.0)
MCHC: 34.4 g/dL (ref 30.0–36.0)
MCV: 92.8 fl (ref 78.0–100.0)
Platelets: 315 10*3/uL (ref 150.0–400.0)
RBC: 4.29 Mil/uL (ref 3.87–5.11)
RDW: 12.6 % (ref 11.5–15.5)
WBC: 6 10*3/uL (ref 4.0–10.5)

## 2020-01-27 LAB — HEMOGLOBIN A1C: Hgb A1c MFr Bld: 5.3 % (ref 4.6–6.5)

## 2020-01-27 NOTE — Progress Notes (Signed)
Received her labs, message to patient  Results for orders placed or performed in visit on 01/27/20  Lipid panel  Result Value Ref Range   Cholesterol 277 (H) 0 - 200 mg/dL   Triglycerides 71.0 0.0 - 149.0 mg/dL   HDL 73.10 >39.00 mg/dL   VLDL 14.2 0.0 - 40.0 mg/dL   LDL Cholesterol 189 (H) 0 - 99 mg/dL   Total CHOL/HDL Ratio 4    NonHDL 203.68   Hemoglobin A1c  Result Value Ref Range   Hgb A1c MFr Bld 5.3 4.6 - 6.5 %  Comprehensive metabolic panel  Result Value Ref Range   Sodium 138 135 - 145 mEq/L   Potassium 4.0 3.5 - 5.1 mEq/L   Chloride 103 96 - 112 mEq/L   CO2 26 19 - 32 mEq/L   Glucose, Bld 93 70 - 99 mg/dL   BUN 12 6 - 23 mg/dL   Creatinine, Ser 0.58 0.40 - 1.20 mg/dL   Total Bilirubin 0.6 0.2 - 1.2 mg/dL   Alkaline Phosphatase 95 39 - 117 U/L   AST 17 0 - 37 U/L   ALT 14 0 - 35 U/L   Total Protein 6.4 6.0 - 8.3 g/dL   Albumin 4.2 3.5 - 5.2 g/dL   GFR 103.65 >60.00 mL/min   Calcium 9.3 8.4 - 10.5 mg/dL  CBC  Result Value Ref Range   WBC 6.0 4.0 - 10.5 K/uL   RBC 4.29 3.87 - 5.11 Mil/uL   Platelets 315.0 150.0 - 400.0 K/uL   Hemoglobin 13.7 12.0 - 15.0 g/dL   HCT 39.9 36.0 - 46.0 %   MCV 92.8 78.0 - 100.0 fl   MCHC 34.4 30.0 - 36.0 g/dL   RDW 12.6 11.5 - 15.5 %

## 2020-01-28 MED ORDER — SIMVASTATIN 20 MG PO TABS: 20.0000 mg | ORAL_TABLET | Freq: Every day | ORAL | 3 refills | Status: DC

## 2020-02-16 ENCOUNTER — Encounter: Payer: Self-pay | Admitting: Family Medicine

## 2020-02-17 MED ORDER — ATORVASTATIN CALCIUM 20 MG PO TABS: 20.0000 mg | ORAL_TABLET | Freq: Every day | ORAL | 3 refills | Status: DC

## 2020-02-20 ENCOUNTER — Ambulatory Visit: Payer: BC Managed Care – PPO | Admitting: Family Medicine

## 2020-02-20 ENCOUNTER — Encounter: Payer: Self-pay | Admitting: Family Medicine

## 2020-02-20 ENCOUNTER — Other Ambulatory Visit: Payer: Self-pay

## 2020-02-20 VITALS — BP 121/77 | HR 82 | Temp 96.6°F | Resp 16 | Ht 61.5 in | Wt 135.0 lb

## 2020-02-20 DIAGNOSIS — B029 Zoster without complications: Secondary | ICD-10-CM | POA: Diagnosis not present

## 2020-02-20 DIAGNOSIS — R21 Rash and other nonspecific skin eruption: Secondary | ICD-10-CM

## 2020-02-20 DIAGNOSIS — H00015 Hordeolum externum left lower eyelid: Secondary | ICD-10-CM

## 2020-02-20 MED ORDER — VALACYCLOVIR HCL 1 G PO TABS: 1000.0000 mg | ORAL_TABLET | Freq: Three times a day (TID) | ORAL | 0 refills | Status: DC

## 2020-02-20 NOTE — Progress Notes (Signed)
[place] at [facility] [place], [place], [place] [address] [phone] [id number]  Date:  02/20/2020   Name:  Danielle Fitzpatrick   DOB:  Apr 27, 1951   MRN:  [id number]  PCP:  Darreld Mclean, MD    Chief Complaint: Herpes Zoster (possible shingles, left eye, rash on buttock)   History of Present Illness:  Danielle Fitzpatrick is a 69 y.o. very pleasant female patient who presents with the following:  Pt with history of hyperlipidemia, allergies  Last night she noted chills She thinks she may have shingles She had a rash on her groin several years ago and was concerned about herpes - they did a blood titer that was negative and they thought she might have shingles She currently notes that she will occasionally get a recurrent sore spot around her tailbone  It has bothered her again for about 2 weeks -will wax and wane   She also thought that she was getting a stye in her left eye - she has noted this for about 2 days She has noticed some redness and swelling of the left lower lid  She wears glasses, no contacts Vision is at normal baseline  No fever No vomiting Patient Active Problem List   Diagnosis Date Noted  . Allergic rhinitis 10/03/2019  . Cough 10/03/2019  . Menopausal syndrome 10/03/2019  . Milia 10/03/2019  . 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 Anxiety 06/17/2017  . Bilateral leg edema 06/17/2017  . Other hemorrhoids 06/17/2017  . Hyperlipidemia 02/12/2017  . Symptomatic menopausal or female climacteric states 04/14/2012  . Onychomycosis due to dermatophyte 04/14/2012  . 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 Enthesopathy 02/09/2012  . Other lymphedema 07/07/2011    Past Medical History:  Diagnosis Date  . Anxiety   . Chicken pox   . Hyperlipidemia     Past Surgical History:  Procedure Laterality Date  . AUGMENTATION MAMMAPLASTY      Social History    Tobacco Use  . Smoking status: Never Smoker  . Smokeless tobacco: Never Used  Substance Use Topics  . Alcohol use: Yes    Comment: occasionally  . Drug use: No    Family History  Problem Relation Age of Onset  . Depression Mother   . Hyperlipidemia Mother   . Hypertension Mother   . Hypertension Father   . Hyperlipidemia Father   . Heart disease Father   . Hearing loss Father   . Diabetes Father   . Depression Father   . Depression Sister   . Alcohol abuse Brother   . Depression Brother   . Early death Brother   . Heart attack Brother   . Hypertension Brother   . Hyperlipidemia Daughter   . Hearing loss Maternal Grandmother   . Diabetes Maternal Grandfather   . Early death Maternal Grandfather   . Heart disease Maternal Grandfather   . Alcohol abuse Paternal Grandmother   . Early death Paternal Grandmother     No Known Allergies  Medication list has been reviewed and updated.  Current Outpatient Medications on File Prior to Visit  Medication Sig Dispense Refill  . aspirin EC 81 MG tablet Take 81 mg by mouth.    Marland Kitchen atorvastatin (LIPITOR) 20 MG tablet Take 1 tablet (20 mg total) by mouth daily. 30 tablet 3  . B Complex Vitamins (VITAMIN-B COMPLEX PO) Take by mouth.    . Cholecalciferol (  VITAMIN D) 2000 units CAPS Take by mouth.    . Magnesium 300 MG CAPS Take by mouth.    . spironolactone (ALDACTONE) 100 MG tablet Take 50 mg daily as needed for swelling 45 tablet 3   No current facility-administered medications on file prior to visit.    Review of Systems:  As per HPI- otherwise negative.   Physical Examination: Vitals:   02/20/20 1444  BP: 121/77  Pulse: 82  Resp: 16  Temp: (!) 96.6 F (35.9 C)  SpO2: 99%   Vitals:   02/20/20 1444  Weight: 135 lb (61.2 kg)  Height: 5' 1.5" (1.562 m)   Body mass index is 25.09 kg/m. Ideal Body Weight: Weight in (lb) to have BMI = 25: 134.2  GEN: no acute distress.  Normal weight, looks well HEENT:  Atraumatic, Normocephalic.  Bilateral TM wnl, oropharynx normal.  PEERL,EOMI. the left lower lid displays a stye with a visible pus head No other skin rashes to suggest shingles on the face Ears and Nose: No external deformity. CV: RRR, No M/G/R. No JVD. No thrill. No extra heart sounds. PULM: CTA B, no wheezes, crackles, rhonchi. No retractions. No resp. distress. No accessory muscle use. ABD: S, NT, ND EXTR: No c/c/e PSYCH: Normally interactive. Conversant.  The posterior gluteal cleft displaced to findings-there is 1 small vesicle, and also an area of cracked skin. I collected a sample of the fluid from inside the vesicle after wiping skin with an alcohol swab   Assessment and Plan: Herpes zoster without complication - Plan: valACYclovir (VALTREX) 1000 MG tablet  Hordeolum externum of left lower eyelid  Rash of body - Plan: Viral culture, CANCELED: Viral culture  Here today with 2 concerns She does appear to have a stye on the left lower lid, we discussed hot compresses to drain pus collection Suspect herpes versus shingles as the cause of the lesion on her buttock.  Viral culture is collected today Start on Valtrex She would like to get her shingles vaccine as soon as we determine the results of her viral culture Discussed shingles versus herpes, possible suppressive therapy if this does turn out to be herpes Moderate medical decision making today This visit occurred during the SARS-CoV-2 public health emergency.  Safety protocols were in place, including screening questions prior to the visit, additional usage of staff PPE, and extensive cleaning of exam room while observing appropriate contact time as indicated for disinfecting solutions.     Signed Lamar Blinks, MD

## 2020-02-20 NOTE — Patient Instructions (Signed)
It was good to see you again today! I think that you have a stye on your left lower lid- please use warm compresses so it will drain.  Let me know if not getting better over the next couple of days  We will do a viral culture of the area on your behind to hopefully determine if herpes or shingles once and for all!  For the time being please start on valtrex, take three times a day for a week   Let me know if any problems or worsening

## 2020-02-21 DIAGNOSIS — L821 Other seborrheic keratosis: Secondary | ICD-10-CM | POA: Diagnosis not present

## 2020-02-21 DIAGNOSIS — B078 Other viral warts: Secondary | ICD-10-CM | POA: Diagnosis not present

## 2020-02-21 DIAGNOSIS — L905 Scar conditions and fibrosis of skin: Secondary | ICD-10-CM | POA: Diagnosis not present

## 2020-02-21 DIAGNOSIS — L570 Actinic keratosis: Secondary | ICD-10-CM | POA: Diagnosis not present

## 2020-02-21 DIAGNOSIS — Z85828 Personal history of other malignant neoplasm of skin: Secondary | ICD-10-CM | POA: Diagnosis not present

## 2020-02-22 LAB — TIQ-MISC

## 2020-02-24 ENCOUNTER — Encounter: Payer: Self-pay | Admitting: Family Medicine

## 2020-02-27 LAB — TEST AUTHORIZATION: TEST CODE:: 2692

## 2020-02-27 LAB — VARICELLA-ZOSTER ANTIGEN,DFA
Micro Number:: 10292348
Result:: NOT DETECTED
Specimen Quality:: ADEQUATE

## 2020-02-27 LAB — HERPES SIMPLEX VIRUS CULTURE
MICRO NUMBER:: 10292347
SPECIMEN QUALITY:: ADEQUATE

## 2020-02-28 ENCOUNTER — Encounter: Payer: Self-pay | Admitting: Family Medicine

## 2020-02-29 ENCOUNTER — Encounter: Payer: Self-pay | Admitting: Family Medicine

## 2020-02-29 ENCOUNTER — Other Ambulatory Visit: Payer: Self-pay

## 2020-03-01 ENCOUNTER — Ambulatory Visit: Payer: BC Managed Care – PPO | Admitting: Family Medicine

## 2020-03-06 DIAGNOSIS — H04123 Dry eye syndrome of bilateral lacrimal glands: Secondary | ICD-10-CM | POA: Diagnosis not present

## 2020-03-08 DIAGNOSIS — S9031XA Contusion of right foot, initial encounter: Secondary | ICD-10-CM | POA: Diagnosis not present

## 2020-03-08 DIAGNOSIS — M722 Plantar fascial fibromatosis: Secondary | ICD-10-CM | POA: Diagnosis not present

## 2020-03-08 DIAGNOSIS — M25572 Pain in left ankle and joints of left foot: Secondary | ICD-10-CM | POA: Diagnosis not present

## 2020-03-08 DIAGNOSIS — M25571 Pain in right ankle and joints of right foot: Secondary | ICD-10-CM | POA: Diagnosis not present

## 2020-03-08 DIAGNOSIS — G5762 Lesion of plantar nerve, left lower limb: Secondary | ICD-10-CM | POA: Diagnosis not present

## 2020-03-20 DIAGNOSIS — B078 Other viral warts: Secondary | ICD-10-CM | POA: Diagnosis not present

## 2020-05-15 DIAGNOSIS — L308 Other specified dermatitis: Secondary | ICD-10-CM | POA: Diagnosis not present

## 2020-05-15 DIAGNOSIS — L821 Other seborrheic keratosis: Secondary | ICD-10-CM | POA: Diagnosis not present

## 2020-05-15 DIAGNOSIS — L988 Other specified disorders of the skin and subcutaneous tissue: Secondary | ICD-10-CM | POA: Diagnosis not present

## 2020-05-15 DIAGNOSIS — L810 Postinflammatory hyperpigmentation: Secondary | ICD-10-CM | POA: Diagnosis not present

## 2020-05-15 DIAGNOSIS — L820 Inflamed seborrheic keratosis: Secondary | ICD-10-CM | POA: Diagnosis not present

## 2020-05-31 DIAGNOSIS — H16141 Punctate keratitis, right eye: Secondary | ICD-10-CM | POA: Diagnosis not present

## 2020-07-25 NOTE — Progress Notes (Signed)
[place] at [facility] [place], [place], [place] [address] [phone] [id number]  Date:  07/26/2020   Name:  Danielle Fitzpatrick   DOB:  January 02, 1951   MRN:  [id number]  PCP:  Darreld Mclean, MD    Chief Complaint: Pre-op Exam (ekg, mammogram)   History of Present Illness:  Danielle Fitzpatrick is a 69 y.o. 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  No T wave inversion was noted during stress.  There was no ST segment deviation noted during stress.  Arrhythmias during stress: rare PVCs.  Overall, the patient's exercise capacity was normal.  [facility] Treadmill Score: low risk   Negative stress test without evidence of ischemia at given workload.   Patient Active Problem List   Diagnosis Date Noted  . Allergic rhinitis 10/03/2019  . Cough 10/03/2019  . Menopausal syndrome 10/03/2019  . Milia 10/03/2019  . 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 Anxiety 06/17/2017  . Bilateral leg edema 06/17/2017  . Other hemorrhoids 06/17/2017  . Hyperlipidemia 02/12/2017  . Symptomatic menopausal or female climacteric states 04/14/2012  . Onychomycosis due to dermatophyte 04/14/2012  . Prolonged  depressive reaction 04/14/2012  . Enthesopathy 02/09/2012  . Other lymphedema 07/07/2011    Past Medical History:  Diagnosis Date  . Anxiety   . Chicken pox   . Hyperlipidemia     Past Surgical History:  Procedure Laterality Date  . AUGMENTATION MAMMAPLASTY      Social History   Tobacco Use  . Smoking status: Never Smoker  . Smokeless tobacco: Never Used  Vaping Use  . Vaping Use: Never used  Substance Use Topics  . Alcohol use: Yes    Comment: occasionally  . Drug use: No    Family History  Problem Relation Age of Onset  . Depression Mother   . Hyperlipidemia Mother   . Hypertension Mother   . Hypertension Father   . Hyperlipidemia Father   . Heart disease Father   . Hearing loss Father   . Diabetes Father   . Depression Father   . Depression Sister   . Alcohol abuse Brother   . Depression Brother   . Early death Brother   . Heart attack Brother   . Hypertension Brother   . Hyperlipidemia Daughter   . Hearing loss Maternal Grandmother   . Diabetes Maternal Grandfather   . Early death Maternal Grandfather   . Heart disease Maternal Grandfather   . Alcohol abuse Paternal Grandmother   . Early death Paternal Grandmother     No Known Allergies  Medication list has been reviewed and updated.  Current Outpatient Medications on File Prior to Visit  Medication Sig Dispense Refill  . aspirin EC 81 MG tablet Take  81 mg by mouth.    . B Complex Vitamins (VITAMIN-B COMPLEX PO) Take by mouth.    . Cholecalciferol (VITAMIN D) 2000 units CAPS Take by mouth.    . Magnesium 300 MG CAPS Take by mouth.    . spironolactone (ALDACTONE) 100 MG tablet Take 50 mg daily as needed for swelling 45 tablet 3   No current facility-administered medications on file prior to visit.    Review of Systems:  As per HPI- otherwise negative.   Physical Examination: Vitals:   07/26/20 1405  BP: 118/78  Pulse: 84  Resp: 16  SpO2: 98%   Vitals:   07/26/20 1405  Weight:  135 lb (61.2 kg)  Height: 5' 1.5" (1.562 m)   Body mass index is 25.09 kg/m. Ideal Body Weight: Weight in (lb) to have BMI = 25: 134.2  GEN: no acute distress. Normal weight, looks well  HEENT: Atraumatic, Normocephalic.  Ears and Nose: No external deformity. CV: RRR, No M/G/R. No JVD. No thrill. No extra heart sounds. PULM: CTA B, no wheezes, crackles, rhonchi. No retractions. No resp. distress. No accessory muscle use. ABD: S, NT, ND, +BS. No rebound. No HSM. EXTR: No c/c/e PSYCH: Normally interactive. Conversant.   EKG normal, compared with previous no significant change Assessment and Plan: Preoperative evaluation to rule out surgical contraindication - Plan: EKG 12-Lead  Encounter for screening mammogram for malignant neoplasm of breast - Plan: MS DIGITAL DIAG TOMO W/IMPLANTS BILATERAL  Immunization due - Plan: Pneumococcal polysaccharide vaccine 23-valent greater than or equal to 2yo subcutaneous/IM  Dyslipidemia - Plan: Lipid panel   Patient here today for preoperative evaluation No concerns, recent labs and EKG are normal-faxed to patient surgeon per her request Gave pneumonia booster today Ordered a lipid panel, she plans to come back at a later date when fasting  This visit occurred during the SARS-CoV-2 public health emergency.  Safety protocols were in place, including screening questions prior to the visit, additional usage of staff PPE, and extensive cleaning of exam room while observing appropriate contact time as indicated for disinfecting solutions.     Signed Lamar Blinks, MD

## 2020-07-26 ENCOUNTER — Ambulatory Visit: Payer: BC Managed Care – PPO | Admitting: Family Medicine

## 2020-07-26 ENCOUNTER — Encounter: Payer: Self-pay | Admitting: Family Medicine

## 2020-07-26 ENCOUNTER — Other Ambulatory Visit: Payer: Self-pay

## 2020-07-26 VITALS — BP 118/78 | HR 84 | Resp 16 | Ht 61.5 in | Wt 135.0 lb

## 2020-07-26 DIAGNOSIS — E785 Hyperlipidemia, unspecified: Secondary | ICD-10-CM | POA: Diagnosis not present

## 2020-07-26 DIAGNOSIS — Z01818 Encounter for other preprocedural examination: Secondary | ICD-10-CM | POA: Diagnosis not present

## 2020-07-26 DIAGNOSIS — Z1231 Encounter for screening mammogram for malignant neoplasm of breast: Secondary | ICD-10-CM | POA: Diagnosis not present

## 2020-07-26 DIAGNOSIS — Z23 Encounter for immunization: Secondary | ICD-10-CM

## 2020-07-26 NOTE — Patient Instructions (Addendum)
It was good to see you again today- I will fax your reports to Dr Evelina Bucy for you  Please schedule a fasting lab visit at your convenience and we will check your lipids  If your lipids are dire we can think of an alternative treatment plan  Best of luck with your operation!

## 2020-07-27 ENCOUNTER — Encounter: Payer: Self-pay | Admitting: Family Medicine

## 2020-07-30 ENCOUNTER — Ambulatory Visit (HOSPITAL_BASED_OUTPATIENT_CLINIC_OR_DEPARTMENT_OTHER)
Admission: RE | Admit: 2020-07-30 | Discharge: 2020-07-30 | Disposition: A | Discharge status: Home / Self Care | Payer: BC Managed Care – PPO | Source: Ambulatory Visit | Attending: Family Medicine | Admitting: Family Medicine

## 2020-07-30 ENCOUNTER — Other Ambulatory Visit: Payer: Self-pay

## 2020-07-30 DIAGNOSIS — Z1231 Encounter for screening mammogram for malignant neoplasm of breast: Secondary | ICD-10-CM

## 2020-08-01 ENCOUNTER — Encounter: Payer: Self-pay | Admitting: Family Medicine

## 2020-08-21 ENCOUNTER — Other Ambulatory Visit: Payer: Self-pay

## 2020-08-21 ENCOUNTER — Other Ambulatory Visit (INDEPENDENT_AMBULATORY_CARE_PROVIDER_SITE_OTHER): Payer: BC Managed Care – PPO

## 2020-08-21 ENCOUNTER — Encounter: Payer: Self-pay | Admitting: Family Medicine

## 2020-08-21 DIAGNOSIS — E785 Hyperlipidemia, unspecified: Secondary | ICD-10-CM | POA: Diagnosis not present

## 2020-08-21 LAB — LIPID PANEL
Cholesterol: 314 mg/dL — ABNORMAL HIGH (ref <200)
HDL: 80 mg/dL (ref >=50)
LDL Cholesterol (Calc): 216 mg/dL (calc) — ABNORMAL HIGH
Non-HDL Cholesterol (Calc): 234 mg/dL (calc) — ABNORMAL HIGH (ref <130)
Total CHOL/HDL Ratio: 3.9 (calc) (ref <5.0)
Triglycerides: 77 mg/dL (ref <150)

## 2020-09-10 ENCOUNTER — Telehealth: Payer: Self-pay | Admitting: Family Medicine

## 2020-09-10 ENCOUNTER — Encounter: Payer: Self-pay | Admitting: Family Medicine

## 2020-09-10 DIAGNOSIS — Z01818 Encounter for other preprocedural examination: Secondary | ICD-10-CM

## 2020-09-10 NOTE — Telephone Encounter (Signed)
Caller: Danielle Fitzpatrick Call Back # [phone]   Patient called requesting that provider puts in orders for CMP & CBC to be drawn prior to surgery on 09/25/2020. Please advise

## 2020-09-11 NOTE — Telephone Encounter (Signed)
Orders are in, ok to schedule for lab

## 2020-09-11 NOTE — Telephone Encounter (Signed)
Patient has been scheduled for lab appointment Thursday.

## 2020-09-11 NOTE — Telephone Encounter (Signed)
Labs ok to be placed?  I will schedule patient if appropriate once orders are in.

## 2020-09-13 ENCOUNTER — Other Ambulatory Visit: Payer: BC Managed Care – PPO

## 2020-09-13 ENCOUNTER — Other Ambulatory Visit: Payer: Self-pay

## 2020-09-13 DIAGNOSIS — Z01818 Encounter for other preprocedural examination: Secondary | ICD-10-CM | POA: Diagnosis not present

## 2020-09-14 ENCOUNTER — Encounter: Payer: Self-pay | Admitting: Family Medicine

## 2020-09-14 LAB — CBC
HCT: 42.9 % (ref 35.0–45.0)
Hemoglobin: 14.4 g/dL (ref 11.7–15.5)
MCH: 31.7 pg (ref 27.0–33.0)
MCHC: 33.6 g/dL (ref 32.0–36.0)
MCV: 94.5 fL (ref 80.0–100.0)
MPV: 10.2 fL (ref 7.5–12.5)
Platelets: 365 10*3/uL (ref 140–400)
RBC: 4.54 10*6/uL (ref 3.80–5.10)
RDW: 11.8 % (ref 11.0–15.0)
WBC: 5.4 10*3/uL (ref 3.8–10.8)

## 2020-09-14 LAB — COMPREHENSIVE METABOLIC PANEL
AG Ratio: 2 (calc) (ref 1.0–2.5)
ALT: 13 U/L (ref 6–29)
AST: 16 U/L (ref 10–35)
Albumin: 4.3 g/dL (ref 3.6–5.1)
Alkaline phosphatase (APISO): 85 U/L (ref 37–153)
BUN: 13 mg/dL (ref 7–25)
CO2: 29 mmol/L (ref 20–32)
Calcium: 9.6 mg/dL (ref 8.6–10.4)
Chloride: 103 mmol/L (ref 98–110)
Creat: 0.62 mg/dL (ref 0.50–0.99)
Globulin: 2.1 g/dL (calc) (ref 1.9–3.7)
Glucose, Bld: 94 mg/dL (ref 65–99)
Potassium: 4.4 mmol/L (ref 3.5–5.3)
Sodium: 140 mmol/L (ref 135–146)
Total Bilirubin: 0.5 mg/dL (ref 0.2–1.2)
Total Protein: 6.4 g/dL (ref 6.1–8.1)

## 2020-09-14 NOTE — Progress Notes (Signed)
Received labs as below, message to patient  Results for orders placed or performed in visit on 09/13/20  CBC  Result Value Ref Range   WBC 5.4 3.8 - 10.8 Thousand/uL   RBC 4.54 3.80 - 5.10 Million/uL   Hemoglobin 14.4 11.7 - 15.5 g/dL   HCT 42.9 35 - 45 %   MCV 94.5 80.0 - 100.0 fL   MCH 31.7 27.0 - 33.0 pg   MCHC 33.6 32.0 - 36.0 g/dL   RDW 11.8 11.0 - 15.0 %   Platelets 365 140 - 400 Thousand/uL   MPV 10.2 7.5 - 12.5 fL  Comprehensive metabolic panel  Result Value Ref Range   Glucose, Bld 94 65 - 99 mg/dL   BUN 13 7 - 25 mg/dL   Creat 0.62 0.50 - 0.99 mg/dL   BUN/Creatinine Ratio NOT APPLICABLE 6 - 22 (calc)   Sodium 140 135 - 146 mmol/L   Potassium 4.4 3.5 - 5.3 mmol/L   Chloride 103 98 - 110 mmol/L   CO2 29 20 - 32 mmol/L   Calcium 9.6 8.6 - 10.4 mg/dL   Total Protein 6.4 6.1 - 8.1 g/dL   Albumin 4.3 3.6 - 5.1 g/dL   Globulin 2.1 1.9 - 3.7 g/dL (calc)   AG Ratio 2.0 1.0 - 2.5 (calc)   Total Bilirubin 0.5 0.2 - 1.2 mg/dL   Alkaline phosphatase (APISO) 85 37 - 153 U/L   AST 16 10 - 35 U/L   ALT 13 6 - 29 U/L

## 2020-09-14 NOTE — Telephone Encounter (Signed)
Labs have been faxed to [facility] per their office and patient request.

## 2020-09-25 DIAGNOSIS — T8544XA Capsular contracture of breast implant, initial encounter: Secondary | ICD-10-CM | POA: Diagnosis not present

## 2020-09-25 DIAGNOSIS — R238 Other skin changes: Secondary | ICD-10-CM | POA: Diagnosis not present

## 2021-01-24 ENCOUNTER — Encounter: Payer: Self-pay | Admitting: Family Medicine

## 2021-01-24 ENCOUNTER — Ambulatory Visit: Payer: BC Managed Care – PPO | Admitting: Family Medicine

## 2021-01-24 ENCOUNTER — Ambulatory Visit (HOSPITAL_BASED_OUTPATIENT_CLINIC_OR_DEPARTMENT_OTHER)
Admission: RE | Admit: 2021-01-24 | Discharge: 2021-01-24 | Disposition: A | Discharge status: Home / Self Care | Payer: BC Managed Care – PPO | Source: Ambulatory Visit | Attending: Family Medicine | Admitting: Family Medicine

## 2021-01-24 ENCOUNTER — Other Ambulatory Visit: Payer: Self-pay

## 2021-01-24 VITALS — BP 110/74 | HR 90 | Resp 16 | Ht 61.5 in | Wt 135.0 lb

## 2021-01-24 DIAGNOSIS — M545 Low back pain, unspecified: Secondary | ICD-10-CM | POA: Diagnosis not present

## 2021-01-24 DIAGNOSIS — R600 Localized edema: Secondary | ICD-10-CM

## 2021-01-24 LAB — BASIC METABOLIC PANEL
BUN: 11 mg/dL (ref 6–23)
CO2: 30 mEq/L (ref 19–32)
Calcium: 9.9 mg/dL (ref 8.4–10.5)
Chloride: 99 mEq/L (ref 96–112)
Creatinine, Ser: 0.68 mg/dL (ref 0.40–1.20)
GFR: 89.67 mL/min (ref >60.00)
Glucose, Bld: 93 mg/dL (ref 70–99)
Potassium: 4.3 mEq/L (ref 3.5–5.1)
Sodium: 138 mEq/L (ref 135–145)

## 2021-01-24 MED ORDER — CYCLOBENZAPRINE HCL 10 MG PO TABS: 5.0000 mg | ORAL_TABLET | Freq: Two times a day (BID) | ORAL | 0 refills | Status: DC | PRN

## 2021-01-24 MED ORDER — SPIRONOLACTONE 100 MG PO TABS: ORAL_TABLET | ORAL | 3 refills | Status: DC

## 2021-01-24 NOTE — Patient Instructions (Signed)
Good to see you today- I will be in touch with your labs and x-rays asap Try flexeril as needed for muscle spasm and pain- this may cause drowsiness so do not use when you are driving  [place] to use ibuprofen or tylenol also as needed Heat, gentle exercise is a good idea Let me know if not improving over the next several days Please see me for a physical in August

## 2021-01-24 NOTE — Progress Notes (Addendum)
New Site at [facility] [address], [place], [place] [address] [phone]  Date:  01/24/2021   Name:  Danielle Fitzpatrick   DOB:  1951/02/19   MRN:  [id number]  PCP:  Darreld Mclean, MD    Chief Complaint: Back Pain (Lower back, one week ago, no known injury)   History of Present Illness:  Danielle Fitzpatrick is a 70 y.o. very pleasant female patient who presents with the following:  Patient today with concern of lower back pain. History of hyperlipidemia, allergies, anxiety and depression Last seen by myself in August-at that time she was evaluated for planned surgery to remove breast implants. 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  Can update BMP today  She has had back pain in the past and generally biofreeze would help, and she has tried Epson salt baths Her current pain started about 8-9 days ago No change in her routine or injury Pain in both side of her lower back, perhaps a bit worse in her right side Her hips feel tight She was having a hard time standing up straight- this is a bit better but still stiff in the am now  She is also using ibuprofen and tylenol but they are bothering her stomach some She is using tumeric She noted some constipation loss of bowel or bladder control  She has noted some tingling in her legs when her legs are stretched out straight- this has now resolved No numbness or weakness of her legs She is doing some pilates and stretches, and she tried to increase her walking   She takes spiro 50 mg 4x a week generally to prevent lower extremity swelling  Patient Active Problem List   Diagnosis Date Noted   . Allergic rhinitis 10/03/2019  . Cough 10/03/2019  . Menopausal syndrome 10/03/2019  . Milia 10/03/2019  . 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 Anxiety 06/17/2017  . Bilateral leg edema 06/17/2017  . Other hemorrhoids 06/17/2017  . Hyperlipidemia 02/12/2017  . Symptomatic menopausal or female climacteric states 04/14/2012  . Onychomycosis due to dermatophyte 04/14/2012  . 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 Enthesopathy 02/09/2012  . Other lymphedema 07/07/2011    Past Medical History:  Diagnosis Date  . Anxiety   . Chicken pox   . Hyperlipidemia     Past Surgical History:  Procedure Laterality Date  . AUGMENTATION MAMMAPLASTY      Social History   Tobacco Use  . Smoking status: Never Smoker  . Smokeless tobacco: Never Used  Vaping Use  . Vaping Use: Never used  Substance Use Topics  . Alcohol use: Yes    Comment: occasionally  . Drug use: No    Family History  Problem Relation Age of Onset  . Depression Mother   . Hyperlipidemia Mother   . Hypertension Mother   . Hypertension Father   . Hyperlipidemia Father   . Heart disease Father   . Hearing loss Father   . Diabetes Father   . Depression Father   . Depression Sister   . Alcohol abuse Brother   . Depression  Brother   . Early death Brother   . Heart attack Brother   . Hypertension Brother   . Hyperlipidemia Daughter   . Hearing loss Maternal Grandmother   . Diabetes Maternal Grandfather   . Early death Maternal Grandfather   . Heart disease Maternal Grandfather   . Alcohol abuse Paternal Grandmother   . Early death Paternal Grandmother     No Known Allergies  Medication list has been reviewed and updated.  Current Outpatient Medications on File Prior to Visit  Medication Sig Dispense Refill  . aspirin EC 81 MG tablet Take 81 mg by mouth.    . B Complex Vitamins (VITAMIN-B COMPLEX PO) Take by mouth.    . Cholecalciferol (VITAMIN  D) 2000 units CAPS Take by mouth.    . Magnesium 300 MG CAPS Take by mouth.    . spironolactone (ALDACTONE) 100 MG tablet Take 50 mg daily as needed for swelling 45 tablet 3   No current facility-administered medications on file prior to visit.    Review of Systems:  As per HPI- otherwise negative.   Physical Examination: Vitals:   01/24/21 1125  BP: 110/74  Pulse: 90  Resp: 16  SpO2: 98%   Vitals:   01/24/21 1125  Weight: 135 lb (61.2 kg)  Height: 5' 1.5" (1.562 m)   Body mass index is 25.09 kg/m. Ideal Body Weight: Weight in (lb) to have BMI = 25: 134.2  GEN: no acute distress. Normal weight, looks well HEENT: Atraumatic, Normocephalic.  Ears and Nose: No external deformity. CV: RRR, No M/G/R. No JVD. No thrill. No extra heart sounds. PULM: CTA B, no wheezes, crackles, rhonchi. No retractions. No resp. distress. No accessory muscle use. ABD: S, NT, ND, +BS. No rebound. No HSM. EXTR: No c/c/e PSYCH: Normally interactive. Conversant.  She notes mild tenderness over the bilateral lower back musculature.  No redness or swelling is present.  Normal thoracolumbar flexion/ extension.  Normal bilateral lower extremity strength, sensation, deep tendon reflex.  Negative straight leg raise bilaterally   Assessment and Plan: Acute bilateral low back pain without sciatica - Plan: DG Lumbar Spine Complete, cyclobenzaprine (FLEXERIL) 10 MG tablet  Bilateral leg edema - Plan: spironolactone (ALDACTONE) 100 MG tablet, Basic metabolic panel  Patient here today with concern of bilateral lower back pain  This seems most likely muscular in origin Will obtain plain films today to evaluate degree degenerative disease Prescribed Flexeril to use as needed, caution regarding sedation Encouraged gentle exercise, heat.  I have asked her to let me know if not improving over the next week or so- Sooner if worse.   We will check basic metabolic profile today to monitor potassium This visit  occurred during the SARS-CoV-2 public health emergency.  Safety protocols were in place, including screening questions prior to the visit, additional usage of staff PPE, and extensive cleaning of exam room while observing appropriate contact time as indicated for disinfecting solutions.    Signed Lamar Blinks, MD  Received her labs 2/24- message to pt  Results for orders placed or performed in visit on 45/80/99  Basic metabolic panel  Result Value Ref Range   Sodium 138 135 - 145 mEq/L   Potassium 4.3 3.5 - 5.1 mEq/L   Chloride 99 96 - 112 mEq/L   CO2 30 19 - 32 mEq/L   Glucose, Bld 93 70 - 99 mg/dL   BUN 11 6 - 23 mg/dL   Creatinine, Ser 0.68 0.40 - 1.20 mg/dL   GFR  89.67 >60.00 mL/min   Calcium 9.9 8.4 - 10.5 mg/dL

## 2021-01-26 MED ORDER — METHOCARBAMOL 500 MG PO TABS: 500.0000 mg | ORAL_TABLET | Freq: Three times a day (TID) | ORAL | 0 refills | Status: DC | PRN

## 2021-01-29 ENCOUNTER — Ambulatory Visit: Payer: BC Managed Care – PPO | Admitting: Rehabilitative and Restorative Service Providers"

## 2021-01-29 ENCOUNTER — Other Ambulatory Visit: Payer: Self-pay

## 2021-01-29 ENCOUNTER — Ambulatory Visit: Payer: BC Managed Care – PPO | Admitting: Physical Therapy

## 2021-01-29 DIAGNOSIS — M6281 Muscle weakness (generalized): Secondary | ICD-10-CM

## 2021-01-29 DIAGNOSIS — M545 Low back pain, unspecified: Secondary | ICD-10-CM | POA: Diagnosis not present

## 2021-01-29 NOTE — Patient Instructions (Signed)
Access Code: [email or web address] URL: [email or web address] Date: 01/29/2021 Prepared by: Rudell Cobb  Exercises Supine Double Knee to Chest - 2 x daily - 7 x weekly - 1 sets - 2 reps - 30-60 seconds hold Supine Lumbar Rotation Stretch - 2 x daily - 7 x weekly - 1 sets - 3 reps - 30 seconds hold Supine Bilateral Isometric Hip Flexion - 2 x daily - 7 x weekly - 1 sets - 5-8 reps - 5 seconds hold Cat-Camel - 2 x daily - 7 x weekly - 1 sets - 10 reps Beginner Front Arm Support - 2 x daily - 7 x weekly - 1 sets - 10 reps

## 2021-01-29 NOTE — Therapy (Signed)
[place] [place], [place], [address] Phone: [phone]   Fax:  [phone]  Physical Therapy Evaluation  Patient Details  Name: Danielle Fitzpatrick MRN: [id number] Date of Birth: 09/29/1951 Referring Provider (PT): Lamar Blinks, MD   Encounter Date: 01/29/2021   PT End of Session - 01/29/21 1050    Visit Number 1    Number of Visits 12    Date for PT Re-Evaluation 03/12/21    Authorization Type BCBS-- 60 visits/year,  $50 copay    PT Start Time 0850    PT Stop Time 0934    PT Time Calculation (min) 44 min    Activity Tolerance Patient tolerated treatment well    Behavior During Therapy [facility] for tasks assessed/performed           Past Medical History:  Diagnosis Date  . Anxiety   . Chicken pox   . Hyperlipidemia     Past Surgical History:  Procedure Laterality Date  . AUGMENTATION MAMMAPLASTY      There were no vitals filed for this visit.    Subjective Assessment - 01/29/21 0853    Subjective The patient began with sudden onset of low back pain about 2 weeks ago.  The only difference in activity is her 68 year old grandson ran and jumped into her arms-- she is unsure is this precipitated the onset.  The most provoking position is standing in one spot and not moving.  She also has stiffness in the morning. She notes nothing was helping pain (used heat, biofreeze, arthritis medicine, epsom salt).   She has been more sedentary over the winter since a minor surgery the end of October.    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    Patient Stated Goals pain is gone    Currently in Pain? Yes    Pain Score 2     Pain Location Back    Pain Orientation Lower    Pain Type Acute pain    Pain Radiating Towards central low back radiating laterally into glut meds, more noted in R hip    Pain Onset  1 to 4 weeks ago    Pain Frequency Intermittent    Aggravating Factors  standing in one sport, stiff in the morning.    Pain Relieving Factors can stoop forward to relieve pain              [facility] PT Assessment - 01/29/21 0900      Assessment   Medical Diagnosis bilateral low back pain    Referring Provider (PT) Lamar Blinks, MD    Onset Date/Surgical Date --   2-3 weeks ago   Prior Therapy none      Balance Screen   Has the patient fallen in the past 6 months No    Has the patient had a decrease in activity level because of a fear of falling?  No    Is the patient reluctant to leave their home because of a fear of falling?  No      Home Ecologist residence      Prior Function   Level of Independence Independent    Vocation Full time employment    Vocation Requirements sitting at desk, gets up and down and can stand up at home      Observation/Other Assessments   Focus  on Therapeutic Outcomes (FOTO)  63% functional status score      Sensation   Light Touch Appears Intact    Additional Comments Prior to onset (the week before) she noted numbness and tingling when she sat in the evenings described as mild sensation.  That resolved when pain began.      ROM / Strength   AROM / PROM / Strength AROM;Strength      AROM   Overall AROM Comments more pain on the L side today    AROM Assessment Site Lumbar    Lumbar Flexion WNLs    Lumbar Extension WNLs    Lumbar - Right Side Bend --   pain in R side   Lumbar - Left Side Bend --   pain in L side   Lumbar - Right Rotation --   pain in L side   Lumbar - Left Rotation WNLs      Strength   Overall Strength Deficits    Strength Assessment Site Hip;Knee;Ankle    Right/Left Hip Right;Left    Right Hip Flexion 4-/5    Right Hip Extension 4-/5    Right Hip ABduction 4+/5    Left Hip Flexion 4-/5    Left Hip Extension 4-/5    Left Hip ABduction 4/5    Right/Left Knee Right;Left    Right Knee  Flexion 5/5    Right Knee Extension 5/5    Left Knee Flexion 5/5    Left Knee Extension 5/5    Right/Left Ankle Right;Left    Right Ankle Dorsiflexion 5/5    Left Ankle Dorsiflexion 5/5      Special Tests    Special Tests Hip Special Tests;Lumbar    Lumbar Tests FABER test    Hip Special Tests  Other      FABER test   findings Negative    Comment bilaterally      other   Findings Negative    Side Right;Left    Comments fadir                      Objective measurements completed on examination: See above findings.       [facility] Adult PT Treatment/Exercise - 01/29/21 1055      Exercises   Exercises Knee/Hip;Lumbar      Lumbar Exercises: Stretches   Double Knee to Chest Stretch 2 reps;30 seconds    Other Lumbar Stretch Exercise lumbar rotation supine      Lumbar Exercises: Supine   Bent Knee Raise 5 reps    Bent Knee Raise Limitations isometric holds x 5 seconds with bilat hips/knees flexed      Lumbar Exercises: Quadruped   Madcat/Old Horse 5 reps    Straight Leg Raise 5 reps    Opposite Arm/Leg Raise Right arm/Left leg;Left arm/Right leg    Opposite Arm/Leg Raise Limitations difficult to maintain neutral spine, therefore did hip extension only to begin                  PT Education - 01/29/21 1049    Education Details HEP    Person(s) Educated Patient    Methods Explanation;Demonstration;Handout    Comprehension Returned demonstration;Verbalized understanding               PT Long Term Goals - 01/29/21 1051      PT LONG TERM GOAL #1   Title The patient will be indep with HEP.    Time 6  Period Weeks    Target Date 03/12/21      PT LONG TERM GOAL #2   Title The patient will imrpove FOTO score from 63% to > or equal to 75% on functional status survey.    Time 6    Period Weeks    Target Date 03/12/21      PT LONG TERM GOAL #3   Title The patient will maintain standing x 30 minutes without increase in low back pain.     Time 6    Period Weeks    Target Date 03/12/21      PT LONG TERM GOAL #4   Title The patient will report no pain/stiffness upon waking.    Time 6    Period Weeks    Target Date 03/12/21      PT LONG TERM GOAL #5   Title The patient will improve LE strength to 5/5 for hip flexion, abduction, and extension.    Time 6    Period Weeks    Target Date 03/12/21                  Plan - 01/29/21 1056    Clinical Impression Statement The patient is a 70 yo female presenting to OP physical therapy with acute onset of low back pain radiating into proximal hips.  She presents with impairments in bilateral hip strength (flexion, extension, abduction), hypomobility lumbar spine with pain with lateral glide R>L and CPA, and pain limiting function.  PT to address deficits to return to prior functional status.    Examination-Activity Limitations Lift;Locomotion Level;Stand;Sleep;Squat    Examination-Participation Restrictions Occupation;Community Activity    Stability/Clinical Decision Making Stable/Uncomplicated    Clinical Decision Making Low    Rehab Potential Good    PT Frequency 2x / week    PT Duration 6 weeks    PT Treatment/Interventions ADLs/Self Care Home Management;Gait training;Stair training;Functional mobility training;Therapeutic activities;Therapeutic exercise;Balance training;Neuromuscular re-education;Manual techniques;Dry needling;Taping;Electrical Stimulation;Moist Heat;Patient/family education    PT Next Visit Plan Progress core stabilization, STM/DN lumbar paraspinals and glut med, strengthening LEs with emphasis on hips    PT Home Exercise Plan Access Code: [email or web address]    Consulted and Agree with Plan of Care Patient           Patient will benefit from skilled therapeutic intervention in order to improve the following deficits and impairments:  Pain,Hypomobility,Decreased strength,Decreased range of motion,Increased fascial restricitons,Decreased activity  tolerance  Visit Diagnosis: Acute bilateral low back pain without sciatica  Muscle weakness (generalized)     Problem List Patient Active Problem List   Diagnosis Date Noted  . Allergic rhinitis 10/03/2019  . Cough 10/03/2019  . Menopausal syndrome 10/03/2019  . Milia 10/03/2019  . 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 Anxiety 06/17/2017  . Bilateral leg edema 06/17/2017  . Other hemorrhoids 06/17/2017  . Hyperlipidemia 02/12/2017  . Symptomatic menopausal or female climacteric states 04/14/2012  . Onychomycosis due to dermatophyte 04/14/2012  . 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 Enthesopathy 02/09/2012  . Other lymphedema 07/07/2011    [place] , PT 01/29/2021, 11:00 AM  [facility] [place], [place], [address] Phone: [phone]   Fax:  [phone]  Name: Danielle Fitzpatrick MRN: [id number] Date of Birth: 05-17-1951

## 2021-01-31 ENCOUNTER — Encounter: Payer: BC Managed Care – PPO | Admitting: Physical Therapy

## 2021-02-05 ENCOUNTER — Other Ambulatory Visit: Payer: Self-pay

## 2021-02-05 ENCOUNTER — Ambulatory Visit (INDEPENDENT_AMBULATORY_CARE_PROVIDER_SITE_OTHER): Payer: BC Managed Care – PPO | Admitting: Physical Therapy

## 2021-02-05 ENCOUNTER — Encounter: Payer: Self-pay | Admitting: Physical Therapy

## 2021-02-05 DIAGNOSIS — M6281 Muscle weakness (generalized): Secondary | ICD-10-CM

## 2021-02-05 DIAGNOSIS — M545 Low back pain, unspecified: Secondary | ICD-10-CM | POA: Diagnosis not present

## 2021-02-05 NOTE — Therapy (Addendum)
[facility] [address] [place], [place], [address] Phone: [phone]   Fax:  [phone]  Physical Therapy Treatment/Discharge Summary  Patient Details  Name: Danielle Fitzpatrick MRN: [id number] Date of Birth: [birth year] Referring Provider (PT): Lamar Blinks, MD    Patient did not return to PT.  Please refer to initial evaluation on 01/29/21 for patient status. Thank you for the referral of this patient. Rudell Cobb, MPT    Encounter Date: 02/05/2021   PT End of Session - 02/05/21 0937    Visit Number 2    Number of Visits 12    Date for PT Re-Evaluation 03/12/21    Authorization Type BCBS-- 60 visits/year,  $50 copay    PT Start Time 0851    PT Stop Time 0933    PT Time Calculation (min) 42 min    Activity Tolerance Patient tolerated treatment well    Behavior During Therapy [facility] for tasks assessed/performed           Past Medical History:  Diagnosis Date  . Anxiety   . Chicken pox   . Hyperlipidemia     Past Surgical History:  Procedure Laterality Date  . AUGMENTATION MAMMAPLASTY      There were no vitals filed for this visit.   Subjective Assessment - 02/05/21 0854    Subjective Pt reports she hasn't taken muscle relaxers or ibruprofen for 3 day. Exercises are helping.  "I'm feeling better".    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    Currently in Pain? No/denies    Pain Score 0-No pain              [facility] PT Assessment - 02/05/21 0001      Assessment   Medical Diagnosis bilateral low back pain    Referring Provider (PT) Lamar Blinks, MD    Onset Date/Surgical Date --   2-3 weeks ago   Prior Therapy none            [facility] Adult PT Treatment/Exercise - 02/05/21 0001      Lumbar Exercises: Stretches   Passive Hamstring Stretch Right;Left;2 reps;30 seconds   supine  with strap   Double Knee to Chest Stretch 2 reps;10 seconds    Lower Trunk Rotation 1 rep;20 seconds   single knee, arm in T   Hip Flexor Stretch Right;2 reps;10 seconds   trial in seated, and standing   Standing Extension 2 reps;5 seconds      Lumbar Exercises: Supine   Bridge 5 reps    Bridge Limitations some "tenderness" in low back    Isometric Hip Flexion 5 reps;5 seconds   bilat   Other Supine Lumbar Exercises pilates intermediate hundred x 25 pulses x 2 reps      Lumbar Exercises: Prone   Opposite Arm/Leg Raise 5 reps;Right arm/Left leg;Left arm/Right leg    Opposite Arm/Leg Raise Limitations cues for form.      Lumbar Exercises: Quadruped   Madcat/Old Horse 5 reps    Madcat/Old Horse Limitations and wag the tail    Straight Leg Raise 5 reps    Opposite Arm/Leg Raise Right arm/Left leg;Left arm/Right leg;5 reps    Other Quadruped Lumbar Exercises childs pose x 3 reps, and 1 rep      Knee/Hip Exercises: Aerobic   Tread Mill 3.5 mph x 4 min for warm up.  PT Education - 02/05/21 1058    Education Details HEP updated.    Person(s) Educated Patient    Methods Explanation;Handout    Comprehension Verbalized understanding;Returned demonstration               PT Long Term Goals - 01/29/21 1051      PT LONG TERM GOAL #1   Title The patient will be indep with HEP.    Time 6    Period Weeks    Target Date 03/12/21      PT LONG TERM GOAL #2   Title The patient will imrpove FOTO score from 63% to > or equal to 75% on functional status survey.    Time 6    Period Weeks    Target Date 03/12/21      PT LONG TERM GOAL #3   Title The patient will maintain standing x 30 minutes without increase in low back pain.    Time 6    Period Weeks    Target Date 03/12/21      PT LONG TERM GOAL #4   Title The patient will report no pain/stiffness upon waking.    Time 6    Period Weeks    Target Date 03/12/21      PT LONG TERM GOAL #5   Title The  patient will improve LE strength to 5/5 for hip flexion, abduction, and extension.    Time 6    Period Weeks    Target Date 03/12/21                 Plan - 02/05/21 0938    Clinical Impression Statement Positive response to exercises thus far.  She required some cues for neutral spine in quadruped; switched to prone with good form.  Pt reported tenderness in low back with bridges; held from HEP. Pt may benefit from hip flexor stretch in HEP.  Pt voiced some interest in aquatic therapy, in future sessions.  She also requested decrease in frequency due to high co-pay.  Progressing well towards LTGs.    Examination-Activity Limitations Lift;Locomotion Level;Stand;Sleep;Squat    Examination-Participation Restrictions Occupation;Community Activity    Stability/Clinical Decision Making Stable/Uncomplicated    Rehab Potential Good    PT Frequency 2x / week    PT Duration 6 weeks    PT Treatment/Interventions ADLs/Self Care Home Management;Gait training;Stair training;Functional mobility training;Therapeutic activities;Therapeutic exercise;Balance training;Neuromuscular re-education;Manual techniques;Dry needling;Taping;Electrical Stimulation;Moist Heat;Patient/family education    PT Next Visit Plan Progress core stabilization, STM/DN lumbar paraspinals and glut med, strengthening LEs with emphasis on hips. Add hip flexor stretch to HEP (and maybe self release with ball).    PT Home Exercise Plan Access Code: [email or web address]    Consulted and Agree with Plan of Care Patient           Patient will benefit from skilled therapeutic intervention in order to improve the following deficits and impairments:  Pain,Hypomobility,Decreased strength,Decreased range of motion,Increased fascial restricitons,Decreased activity tolerance  Visit Diagnosis: Acute bilateral low back pain without sciatica  Muscle weakness (generalized)     Problem List Patient Active Problem List   Diagnosis Date Noted  .  Allergic rhinitis 10/03/2019  . Cough 10/03/2019  . Menopausal syndrome 10/03/2019  . Milia 10/03/2019  . 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 Anxiety 06/17/2017  . Bilateral leg edema 06/17/2017  . Other hemorrhoids 06/17/2017  . Hyperlipidemia 02/12/2017  . Symptomatic menopausal or female climacteric states 04/14/2012  .  Onychomycosis due to dermatophyte 04/14/2012  . 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 Enthesopathy 02/09/2012  . Other lymphedema 07/07/2011   Kerin Perna, PTA 02/05/21 11:02 AM  [place], [place], [address] Phone: [phone]   Fax:  [phone]  Name: Quida Glasser MRN: [id number] Date of Birth: [birth year]

## 2021-02-05 NOTE — Patient Instructions (Signed)
Trunk Extension    Standing, place back of open hands on low back. Straighten spine then arch the back and move shoulders back.  hold 5 seconds.  Do this after you've been sitting for a while.

## 2021-02-08 ENCOUNTER — Encounter: Payer: BC Managed Care – PPO | Admitting: Rehabilitative and Restorative Service Providers"

## 2021-02-12 ENCOUNTER — Encounter: Payer: BC Managed Care – PPO | Admitting: Rehabilitative and Restorative Service Providers"

## 2021-02-12 ENCOUNTER — Ambulatory Visit: Payer: BC Managed Care – PPO | Admitting: Physical Therapy

## 2021-02-15 ENCOUNTER — Encounter: Payer: BC Managed Care – PPO | Admitting: Rehabilitative and Restorative Service Providers"

## 2021-04-08 DIAGNOSIS — B351 Tinea unguium: Secondary | ICD-10-CM | POA: Diagnosis not present

## 2021-04-15 DIAGNOSIS — D485 Neoplasm of uncertain behavior of skin: Secondary | ICD-10-CM | POA: Diagnosis not present

## 2021-04-15 DIAGNOSIS — L300 Nummular dermatitis: Secondary | ICD-10-CM | POA: Diagnosis not present

## 2021-04-15 DIAGNOSIS — L821 Other seborrheic keratosis: Secondary | ICD-10-CM | POA: Diagnosis not present

## 2021-04-15 DIAGNOSIS — L570 Actinic keratosis: Secondary | ICD-10-CM | POA: Diagnosis not present

## 2021-04-15 DIAGNOSIS — L820 Inflamed seborrheic keratosis: Secondary | ICD-10-CM | POA: Diagnosis not present

## 2021-04-15 DIAGNOSIS — L578 Other skin changes due to chronic exposure to nonionizing radiation: Secondary | ICD-10-CM | POA: Diagnosis not present

## 2021-04-23 DIAGNOSIS — M19072 Primary osteoarthritis, left ankle and foot: Secondary | ICD-10-CM | POA: Diagnosis not present

## 2021-04-23 DIAGNOSIS — M25572 Pain in left ankle and joints of left foot: Secondary | ICD-10-CM | POA: Diagnosis not present

## 2021-04-24 ENCOUNTER — Telehealth: Payer: Self-pay | Admitting: Podiatry

## 2021-04-24 NOTE — Telephone Encounter (Signed)
[facility] patient called inquiring about the [facility] location, stated they was once a patient of [facility] but established care closer to home, not satisfied with services/care of the current provider and would like to reestablish care with a provider at [place] and [place]. I informed patient the [facility] office is not a fully functioning clinic and currently opens only Fridays from 8-12. Patient agreed to be seen again in the [place] location due to availability. In the midst of scheduling, patient asked question regarding recent [place] and wanted to know if the provider could obtain medical records including Xray's. I begin to explain the process and before I complete sentence I started with you would have to obtain information, patient stated that was too much and disconnected line. I then called patient back and patient began to get smart and stated I wasn't being helpful. I tried to explain to the patient that I was in mid sentence when she hung up and I wasn't able to complete sentence to give patient the information that was needed for the process so we could obtain the records on patients behalf. Patient stated I had her feeling as if it was her responsibility to go get records/X-rays and wanting her to do too much. Patient then stated she would call another day and start fresh because this aint working for her.

## 2021-05-03 DIAGNOSIS — Z5181 Encounter for therapeutic drug level monitoring: Secondary | ICD-10-CM | POA: Diagnosis not present

## 2021-05-03 DIAGNOSIS — M19072 Primary osteoarthritis, left ankle and foot: Secondary | ICD-10-CM | POA: Diagnosis not present

## 2021-05-03 DIAGNOSIS — M25572 Pain in left ankle and joints of left foot: Secondary | ICD-10-CM | POA: Diagnosis not present

## 2021-06-10 ENCOUNTER — Encounter: Payer: Self-pay | Admitting: Family Medicine

## 2021-06-10 DIAGNOSIS — H44001 Unspecified purulent endophthalmitis, right eye: Secondary | ICD-10-CM

## 2021-06-10 MED ORDER — TOBRAMYCIN 0.3 % OP SOLN
1.0000 [drp] | OPHTHALMIC | 0 refills | Status: DC
Start: 2021-06-10 — End: 2021-07-22

## 2021-06-10 MED ORDER — TOBRAMYCIN 0.3 % OP SOLN: 1.0000 [drp] | OPHTHALMIC | 0 refills | Status: DC

## 2021-07-17 NOTE — Progress Notes (Addendum)
[place] at [facility] [address] [place], [place] [address] [phone]  Date:  07/22/2021   Name:  Danielle Fitzpatrick   DOB:  January 13, 1951   MRN:  [id number]  PCP:  Darreld Mclean, MD    Chief Complaint: Annual Exam   History of Present Illness:  Danielle Fitzpatrick is a 70 y.o. very pleasant female patient who presents with the following:  Patient seen today for a physical exam History of hyperlipidemia, allergies, anxiety and depression Last visit with myself in February  She has 2 children and 4 grandchildren.  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  We prescribed Flexeril and gentle exercise, heat  Shingles vaccine she did have Zostavax in 2014, has not done Shingrix as of yet.  Cost is a concern COVID-19 booster Cologuard is due to update- pt declines testing at this time  BMP done in February, can update complete labs today Bone density-2019.  In normal range.  She declines update today Mammogram- she had her implants removed in October, she notes that she had "all of her tissues sent off for pathology" and all was normal.  She declines a mammogram today This was done through [facility] plastic surgery  Pt notes she is having some more issues with depression.  She feels like many of her symptoms are due to stress from her job. She took a few days off from work which did help- she spent time with friends and felt better She notes that for a few weeks she has felt "close to tears"  She is not quite able to retire yet - she needs to save a bit more money   She was on medication for depression in the past- we treated her with prozac back in 2019 This worked okay but also made her sleepy She has started using some medical MJ which is helping with her anxiety  She also will use CBD at times She is sleeping ok, appetite is good.  She is  exercising on a regular basis.  No suicidal ideation  She uses spiro about 3-4x a week as needed for swelling of her legs   She also notes pain in both of her feet.  She may get a little swelling on the dorsum of the foot periodically.  She has had several different surgeries in her various toes per podiatry.  She notes that she continues to have pain, particularly in the right small toe which is rotated externally.  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   Senile hyperkeratosis 01/10/2019   Anxiety 06/17/2017   Bilateral leg edema 06/17/2017   Other hemorrhoids 06/17/2017   Hyperlipidemia 02/12/2017   Symptomatic menopausal or female climacteric states 04/14/2012   Onychomycosis due to dermatophyte 04/14/2012   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   Enthesopathy 02/09/2012   Other lymphedema 07/07/2011    Past Medical History:  Diagnosis Date   Anxiety  Chicken pox    Hyperlipidemia     Past Surgical History:  Procedure Laterality Date   AUGMENTATION MAMMAPLASTY      Social History   Tobacco Use   Smoking status: Never   Smokeless tobacco: Never  Vaping Use   Vaping Use: Never used  Substance Use Topics   Alcohol use: Yes    Comment: occasionally   Drug use: No    Family History  Problem Relation Age of Onset   Depression Mother    Hyperlipidemia Mother    Hypertension Mother    Hypertension Father    Hyperlipidemia Father    Heart disease Father    Hearing loss Father    Diabetes Father    Depression Father    Depression Sister    Alcohol abuse Brother    Depression  Brother    Early death Brother    Heart attack Brother    Hypertension Brother    Hyperlipidemia Daughter    Hearing loss Maternal Grandmother    Diabetes Maternal Grandfather    Early death Maternal Grandfather    Heart disease Maternal Grandfather    Alcohol abuse Paternal Grandmother    Early death Paternal Grandmother     No Known Allergies  Medication list has been reviewed and updated.  Current Outpatient Medications on File Prior to Visit  Medication Sig Dispense Refill   aspirin EC 81 MG tablet Take 81 mg by mouth.     B Complex Vitamins (VITAMIN-B COMPLEX PO) Take by mouth.     Biotin 10000 MCG TABS Take by mouth.     cetirizine (ZYRTEC) 10 MG tablet Take 10 mg by mouth daily.     Cholecalciferol (VITAMIN D) 2000 units CAPS Take by mouth.     Magnesium 300 MG CAPS Take by mouth.     spironolactone (ALDACTONE) 100 MG tablet Take 50 mg daily as needed for swelling 45 tablet 3   No current facility-administered medications on file prior to visit.    Review of Systems:  As per HPI- otherwise negative.   Physical Examination: Vitals:   07/22/21 0845  BP: 118/72  Pulse: 72  Resp: 15  Temp: 98 F (36.7 C)  SpO2: 99%   Vitals:   07/22/21 0845  Weight: 134 lb (60.8 kg)  Height: '5\' 2"'$  (1.575 m)   Body mass index is 24.51 kg/m. Ideal Body Weight: Weight in (lb) to have BMI = 25: 136.4  GEN: no acute distress. Normal weight, looks well  HEENT: Atraumatic, Normocephalic.   Bilateral TM wnl, oropharynx normal.  PEERL,EOMI.   Ears and Nose: No external deformity. CV: RRR, No M/G/R. No JVD. No thrill. No extra heart sounds. PULM: CTA B, no wheezes, crackles, rhonchi. No retractions. No resp. distress. No accessory muscle use. ABD: S, NT, ND, +BS. No rebound. No HSM. EXTR: No c/c/e PSYCH: Normally interactive. Conversant.  Her feet show evidence of several operations on the toes.  The right pinky toe is rotated externally, and displays a callus at the  base  Assessment and Plan: Physical exam  Bilateral leg edema - Plan: Ambulatory referral to Orthopedic Surgery, spironolactone (ALDACTONE) 100 MG tablet  Mixed hyperlipidemia - Plan: Lipid panel  Screening for deficiency anemia - Plan: CBC  Screening for diabetes mellitus - Plan: Comprehensive metabolic panel, Hemoglobin A1c  Fatigue, unspecified type - Plan: TSH, VITAMIN D 25 Hydroxy (Vit-D Deficiency, Fractures)  Adjustment disorder with depressed mood - Plan: buPROPion ER (WELLBUTRIN SR) 100 MG 12 hr  tablet  Physical exam today.  Encouraged healthy diet and exercise routine Will plan further follow- up pending labs. She notes depression symptoms likely related to stress from her work.  Anxiety is not a big concern for her at this time.  We decided to start her on bupropion 100 mg twice daily.  She will let me know how this works for her I placed a referral to foot and ankle surgery, Dr. Doran Durand or Dr. Lucia Gaskins to examine her feet and give recommendations Will plan further follow- up pending labs. Discussed recommended screening tests  This visit occurred during the SARS-CoV-2 public health emergency.  Safety protocols were in place, including screening questions prior to the visit, additional usage of staff PPE, and extensive cleaning of exam room while observing appropriate contact time as indicated for disinfecting solutions.   Signed Lamar Blinks, MD  Received her labs as below, message to pt  Results for orders placed or performed in visit on 07/22/21  CBC  Result Value Ref Range   WBC 6.1 4.0 - 10.5 K/uL   RBC 4.36 3.87 - 5.11 Mil/uL   Platelets 292.0 150.0 - 400.0 K/uL   Hemoglobin 13.7 12.0 - 15.0 g/dL   HCT 40.5 36.0 - 46.0 %   MCV 92.9 78.0 - 100.0 fl   MCHC 33.8 30.0 - 36.0 g/dL   RDW 13.3 11.5 - 15.5 %  Comprehensive metabolic panel  Result Value Ref Range   Sodium 139 135 - 145 mEq/L   Potassium 4.2 3.5 - 5.1 mEq/L   Chloride 102 96 - 112 mEq/L   CO2 28  19 - 32 mEq/L   Glucose, Bld 83 70 - 99 mg/dL   BUN 12 6 - 23 mg/dL   Creatinine, Ser 0.66 0.40 - 1.20 mg/dL   Total Bilirubin 0.6 0.2 - 1.2 mg/dL   Alkaline Phosphatase 84 39 - 117 U/L   AST 17 0 - 37 U/L   ALT 13 0 - 35 U/L   Total Protein 6.4 6.0 - 8.3 g/dL   Albumin 4.2 3.5 - 5.2 g/dL   GFR 90.00 >60.00 mL/min   Calcium 9.6 8.4 - 10.5 mg/dL  Hemoglobin A1c  Result Value Ref Range   Hgb A1c MFr Bld 5.7 4.6 - 6.5 %  Lipid panel  Result Value Ref Range   Cholesterol 289 (H) 0 - 200 mg/dL   Triglycerides 84.0 0.0 - 149.0 mg/dL   HDL 79.80 >39.00 mg/dL   VLDL 16.8 0.0 - 40.0 mg/dL   LDL Cholesterol 192 (H) 0 - 99 mg/dL   Total CHOL/HDL Ratio 4    NonHDL 209.16   TSH  Result Value Ref Range   TSH 1.10 0.35 - 5.50 uIU/mL  VITAMIN D 25 Hydroxy (Vit-D Deficiency, Fractures)  Result Value Ref Range   VITD 40.30 30.00 - 100.00 ng/mL

## 2021-07-17 NOTE — Patient Instructions (Addendum)
It was good to see you again today, I will be in touch with your labs as soon as possible We will place a referral for you to be seen by Foot and Ankle ortho specialist   Let's have you start on wellbutrin- take it once a day (am) for 3 days, then increase to twice a day  Let me know how this works for you  I would recommend colon cancer, breast cancer and bone density screening- I am glad to order these for you if you wish! I would also recommend that you get the shingles vaccine (Shingrix) at your convenience.  This can be given at your pharmacy if you like  JC

## 2021-07-22 ENCOUNTER — Other Ambulatory Visit: Payer: Self-pay

## 2021-07-22 ENCOUNTER — Encounter: Payer: Self-pay | Admitting: Family Medicine

## 2021-07-22 ENCOUNTER — Ambulatory Visit (INDEPENDENT_AMBULATORY_CARE_PROVIDER_SITE_OTHER): Payer: BC Managed Care – PPO | Admitting: Family Medicine

## 2021-07-22 VITALS — BP 118/72 | HR 72 | Temp 98.0°F | Resp 15 | Ht 62.0 in | Wt 134.0 lb

## 2021-07-22 DIAGNOSIS — R600 Localized edema: Secondary | ICD-10-CM | POA: Diagnosis not present

## 2021-07-22 DIAGNOSIS — R5383 Other fatigue: Secondary | ICD-10-CM

## 2021-07-22 DIAGNOSIS — F4321 Adjustment disorder with depressed mood: Secondary | ICD-10-CM

## 2021-07-22 DIAGNOSIS — Z131 Encounter for screening for diabetes mellitus: Secondary | ICD-10-CM | POA: Diagnosis not present

## 2021-07-22 DIAGNOSIS — E782 Mixed hyperlipidemia: Secondary | ICD-10-CM | POA: Diagnosis not present

## 2021-07-22 DIAGNOSIS — Z0000 Encounter for general adult medical examination without abnormal findings: Secondary | ICD-10-CM

## 2021-07-22 DIAGNOSIS — Z130 Encounter for screening for diseases of the blood and blood-forming organs and certain disorders involving the immune mechanism: Secondary | ICD-10-CM | POA: Diagnosis not present

## 2021-07-22 LAB — COMPREHENSIVE METABOLIC PANEL
ALT: 13 U/L (ref 0–35)
AST: 17 U/L (ref 0–37)
Albumin: 4.2 g/dL (ref 3.5–5.2)
Alkaline Phosphatase: 84 U/L (ref 39–117)
BUN: 12 mg/dL (ref 6–23)
CO2: 28 mEq/L (ref 19–32)
Calcium: 9.6 mg/dL (ref 8.4–10.5)
Chloride: 102 mEq/L (ref 96–112)
Creatinine, Ser: 0.66 mg/dL (ref 0.40–1.20)
GFR: 90 mL/min (ref >60.00)
Glucose, Bld: 83 mg/dL (ref 70–99)
Potassium: 4.2 mEq/L (ref 3.5–5.1)
Sodium: 139 mEq/L (ref 135–145)
Total Bilirubin: 0.6 mg/dL (ref 0.2–1.2)
Total Protein: 6.4 g/dL (ref 6.0–8.3)

## 2021-07-22 LAB — CBC
HCT: 40.5 % (ref 36.0–46.0)
Hemoglobin: 13.7 g/dL (ref 12.0–15.0)
MCHC: 33.8 g/dL (ref 30.0–36.0)
MCV: 92.9 fl (ref 78.0–100.0)
Platelets: 292 10*3/uL (ref 150.0–400.0)
RBC: 4.36 Mil/uL (ref 3.87–5.11)
RDW: 13.3 % (ref 11.5–15.5)
WBC: 6.1 10*3/uL (ref 4.0–10.5)

## 2021-07-22 LAB — LIPID PANEL
Cholesterol: 289 mg/dL — ABNORMAL HIGH (ref 0–200)
HDL: 79.8 mg/dL (ref >39.00)
LDL Cholesterol: 192 mg/dL — ABNORMAL HIGH (ref 0–99)
NonHDL: 209.16
Total CHOL/HDL Ratio: 4
Triglycerides: 84 mg/dL (ref 0.0–149.0)
VLDL: 16.8 mg/dL (ref 0.0–40.0)

## 2021-07-22 LAB — TSH: TSH: 1.1 u[IU]/mL (ref 0.35–5.50)

## 2021-07-22 LAB — VITAMIN D 25 HYDROXY (VIT D DEFICIENCY, FRACTURES): VITD: 40.3 ng/mL (ref 30.00–100.00)

## 2021-07-22 LAB — HEMOGLOBIN A1C: Hgb A1c MFr Bld: 5.7 % (ref 4.6–6.5)

## 2021-07-22 MED ORDER — SPIRONOLACTONE 100 MG PO TABS: ORAL_TABLET | ORAL | 3 refills | Status: DC

## 2021-07-22 MED ORDER — BUPROPION HCL ER (SR) 100 MG PO TB12: 100.0000 mg | ORAL_TABLET | Freq: Two times a day (BID) | ORAL | 6 refills | Status: DC

## 2021-07-31 ENCOUNTER — Telehealth: Payer: Self-pay

## 2021-07-31 DIAGNOSIS — R300 Dysuria: Secondary | ICD-10-CM

## 2021-07-31 NOTE — Telephone Encounter (Signed)
Danielle Fitzpatrick called stating she was just in office last week and was wanting to know if Dr. Lorelei Pont would be willing to put in an order for her to do a specimen for a possible UTI.  Danielle Fitzpatrick stated she has been experiencing frequent urination for the past 3-4 days.  Please advise.

## 2021-07-31 NOTE — Telephone Encounter (Signed)
Called patient, I have scheduled her to leave urine sample tomorrow.

## 2021-08-01 ENCOUNTER — Other Ambulatory Visit: Payer: Self-pay

## 2021-08-01 ENCOUNTER — Other Ambulatory Visit (INDEPENDENT_AMBULATORY_CARE_PROVIDER_SITE_OTHER): Payer: BC Managed Care – PPO

## 2021-08-01 ENCOUNTER — Encounter: Payer: Self-pay | Admitting: Family Medicine

## 2021-08-01 DIAGNOSIS — M79671 Pain in right foot: Secondary | ICD-10-CM | POA: Diagnosis not present

## 2021-08-01 DIAGNOSIS — R300 Dysuria: Secondary | ICD-10-CM

## 2021-08-01 DIAGNOSIS — M19072 Primary osteoarthritis, left ankle and foot: Secondary | ICD-10-CM | POA: Diagnosis not present

## 2021-08-01 DIAGNOSIS — L84 Corns and callosities: Secondary | ICD-10-CM | POA: Diagnosis not present

## 2021-08-01 DIAGNOSIS — M79672 Pain in left foot: Secondary | ICD-10-CM | POA: Diagnosis not present

## 2021-08-01 LAB — POCT URINALYSIS DIP (MANUAL ENTRY)
Bilirubin, UA: NEGATIVE
Blood, UA: NEGATIVE
Glucose, UA: NEGATIVE mg/dL
Ketones, POC UA: NEGATIVE mg/dL
Leukocytes, UA: NEGATIVE
Nitrite, UA: NEGATIVE
Protein Ur, POC: NEGATIVE mg/dL
Spec Grav, UA: <=1.005 — AB (ref 1.010–1.025)
Urobilinogen, UA: 0.2 E.U./dL
pH, UA: 6 (ref 5.0–8.0)

## 2021-08-01 MED ORDER — NITROFURANTOIN MONOHYD MACRO 100 MG PO CAPS: 100.0000 mg | ORAL_CAPSULE | Freq: Two times a day (BID) | ORAL | 0 refills | Status: DC

## 2021-08-02 LAB — URINE CULTURE
MICRO NUMBER:: 12321574
Result:: NO GROWTH
SPECIMEN QUALITY:: ADEQUATE

## 2021-08-22 DIAGNOSIS — M19072 Primary osteoarthritis, left ankle and foot: Secondary | ICD-10-CM | POA: Diagnosis not present

## 2021-09-02 ENCOUNTER — Encounter: Payer: Self-pay | Admitting: Family Medicine

## 2021-09-02 MED ORDER — VALACYCLOVIR HCL 1 G PO TABS: ORAL_TABLET | ORAL | 0 refills | Status: DC

## 2021-09-21 DIAGNOSIS — Z20822 Contact with and (suspected) exposure to covid-19: Secondary | ICD-10-CM | POA: Diagnosis not present

## 2021-09-30 ENCOUNTER — Encounter: Payer: Self-pay | Admitting: Family Medicine

## 2021-09-30 DIAGNOSIS — D692 Other nonthrombocytopenic purpura: Secondary | ICD-10-CM | POA: Diagnosis not present

## 2021-09-30 DIAGNOSIS — L821 Other seborrheic keratosis: Secondary | ICD-10-CM | POA: Diagnosis not present

## 2021-09-30 DIAGNOSIS — L570 Actinic keratosis: Secondary | ICD-10-CM | POA: Diagnosis not present

## 2021-09-30 DIAGNOSIS — D485 Neoplasm of uncertain behavior of skin: Secondary | ICD-10-CM | POA: Diagnosis not present

## 2021-09-30 DIAGNOSIS — L820 Inflamed seborrheic keratosis: Secondary | ICD-10-CM | POA: Diagnosis not present

## 2021-10-01 MED ORDER — BUPROPION HCL ER (XL) 300 MG PO TB24: 300.0000 mg | ORAL_TABLET | Freq: Every day | ORAL | 6 refills | Status: DC

## 2021-10-01 NOTE — Addendum Note (Signed)
Addended by: Lamar Blinks C on: 10/01/2021 08:28 AM   Modules accepted: Orders

## 2021-10-17 ENCOUNTER — Encounter: Payer: Self-pay | Admitting: Family Medicine

## 2021-10-17 DIAGNOSIS — H44001 Unspecified purulent endophthalmitis, right eye: Secondary | ICD-10-CM

## 2021-10-18 MED ORDER — TOBRAMYCIN 0.3 % OP SOLN: 1.0000 [drp] | OPHTHALMIC | 0 refills | Status: DC

## 2021-10-18 NOTE — Addendum Note (Signed)
Addended by: Lamar Blinks C on: 10/18/2021 12:26 PM   Modules accepted: Orders

## 2021-10-27 ENCOUNTER — Encounter: Payer: Self-pay | Admitting: Family Medicine

## 2021-10-28 MED ORDER — BUPROPION HCL ER (XL) 300 MG PO TB24: 300.0000 mg | ORAL_TABLET | Freq: Every day | ORAL | 1 refills | Status: DC

## 2022-01-09 ENCOUNTER — Encounter: Payer: Self-pay | Admitting: Family Medicine

## 2022-01-09 ENCOUNTER — Ambulatory Visit: Payer: BC Managed Care – PPO | Admitting: Family Medicine

## 2022-01-09 VITALS — BP 110/72 | HR 91 | Temp 98.2°F | Resp 18 | Wt 132.0 lb

## 2022-01-09 DIAGNOSIS — K921 Melena: Secondary | ICD-10-CM | POA: Diagnosis not present

## 2022-01-09 DIAGNOSIS — R1030 Lower abdominal pain, unspecified: Secondary | ICD-10-CM | POA: Diagnosis not present

## 2022-01-09 DIAGNOSIS — M545 Low back pain, unspecified: Secondary | ICD-10-CM

## 2022-01-09 LAB — POCT URINALYSIS DIP (MANUAL ENTRY)
Bilirubin, UA: NEGATIVE
Blood, UA: NEGATIVE
Glucose, UA: NEGATIVE mg/dL
Ketones, POC UA: NEGATIVE mg/dL
Leukocytes, UA: NEGATIVE
Nitrite, UA: NEGATIVE
Protein Ur, POC: NEGATIVE mg/dL
Spec Grav, UA: 1.01 (ref 1.010–1.025)
Urobilinogen, UA: 0.2 E.U./dL
pH, UA: 6 (ref 5.0–8.0)

## 2022-01-09 LAB — AMYLASE: Amylase: 50 U/L (ref 27–131)

## 2022-01-09 LAB — HEMOCCULT GUIAC POC 1CARD (OFFICE)
Card #1 Date: 2092023
Fecal Occult Blood, POC: NEGATIVE

## 2022-01-09 LAB — COMPREHENSIVE METABOLIC PANEL
ALT: 12 U/L (ref 0–35)
AST: 14 U/L (ref 0–37)
Albumin: 4.2 g/dL (ref 3.5–5.2)
Alkaline Phosphatase: 87 U/L (ref 39–117)
BUN: 14 mg/dL (ref 6–23)
CO2: 30 mEq/L (ref 19–32)
Calcium: 9.6 mg/dL (ref 8.4–10.5)
Chloride: 102 mEq/L (ref 96–112)
Creatinine, Ser: 0.66 mg/dL (ref 0.40–1.20)
GFR: 89.71 mL/min (ref >60.00)
Glucose, Bld: 80 mg/dL (ref 70–99)
Potassium: 4.1 mEq/L (ref 3.5–5.1)
Sodium: 140 mEq/L (ref 135–145)
Total Bilirubin: 0.3 mg/dL (ref 0.2–1.2)
Total Protein: 6.4 g/dL (ref 6.0–8.3)

## 2022-01-09 LAB — CBC
HCT: 40.4 % (ref 36.0–46.0)
Hemoglobin: 13.4 g/dL (ref 12.0–15.0)
MCHC: 33.2 g/dL (ref 30.0–36.0)
MCV: 93.3 fl (ref 78.0–100.0)
Platelets: 327 10*3/uL (ref 150.0–400.0)
RBC: 4.33 Mil/uL (ref 3.87–5.11)
RDW: 12.9 % (ref 11.5–15.5)
WBC: 5.4 10*3/uL (ref 4.0–10.5)

## 2022-01-09 LAB — LIPASE: Lipase: 54 U/L (ref 11.0–59.0)

## 2022-01-09 NOTE — Progress Notes (Addendum)
[place] at [facility] [place], [place], [place] [address] [phone]  Date:  01/09/2022   Name:  Danielle Fitzpatrick   DOB:  1951-04-04   MRN:  [id number]  PCP:  Darreld Mclean, MD    Chief Complaint: abdominal and back pain   History of Present Illness:  Danielle Fitzpatrick is a 71 y.o. very pleasant female patient who presents with the following:  Pt seen today with concern of pain Last visit with myself was in August - History of hyperlipidemia, allergies, anxiety and depression  Pt notes about a week ago she started not feeling well She had a stomach ache and felt really bloated Her usual jeans were really tight and uncomfortable, she had to unzip them  She tried to walk and it helped her some She does tend to note increased gassiness with the wellbutrin  Over the weekend- Saturday- she was not feeling well but then started to feel better Sunday she was having a lot of back pain and also the abdominal pain  Then she felt better as the day went on  She notes that since the new year she is drinking just a couple of ounces of wine daily Since last week she stopped using this and tried to clean up her diet  Drinking carbonation made her feel worse   Sunday am she noted a black colored stool that got her worried- since them black resolved However she had used some pepto she thinks prior to this which likely explains the dark stool She continues to feel like her belly is tender and slightly bloated Some back pain continues   No vomiting but she did feel nauseated  No urinary sx She uses metamucil daily, adjusts her dose depending on her daily stool consistency and output She is still having pretty good stools No fever or diarrhea No red blood in her stool  Never did any colon cancer screening -she is still not interested in doing this Never had pancreatitis or diverticulitis   She does note a lot of gas- 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   Enthesopathy 02/09/2012   Other lymphedema 07/07/2011    Past Medical History:  Diagnosis Date   Anxiety    Chicken pox    Hyperlipidemia     Past Surgical History:  Procedure Laterality Date   AUGMENTATION MAMMAPLASTY      Social History   Tobacco Use   Smoking status: Never   Smokeless tobacco: Never  Vaping Use   Vaping Use: Never used  Substance Use Topics   Alcohol use: Yes    Comment: occasionally  Drug use: No    Family History  Problem Relation Age of Onset   Depression Mother    Hyperlipidemia Mother    Hypertension Mother    Hypertension Father    Hyperlipidemia Father    Heart disease Father    Hearing loss Father    Diabetes Father    Depression Father    Depression Sister    Alcohol abuse Brother    Depression Brother    Early death Brother    Heart attack Brother    Hypertension Brother    Hyperlipidemia Daughter    Hearing loss Maternal Grandmother    Diabetes Maternal Grandfather    Early death Maternal Grandfather    Heart disease Maternal Grandfather    Alcohol abuse Paternal Grandmother    Early death  Paternal Grandmother     No Known Allergies  Medication list has been reviewed and updated.  Current Outpatient Medications on File Prior to Visit  Medication Sig Dispense Refill   aspirin EC 81 MG tablet Take 81 mg by mouth.     B Complex Vitamins (VITAMIN-B COMPLEX PO) Take by mouth.     Biotin 10000 MCG TABS Take by mouth.     buPROPion (WELLBUTRIN XL) 300 MG 24 hr tablet Take 1 tablet (300 mg total) by mouth daily. 90 tablet 1   cetirizine (ZYRTEC) 10 MG tablet Take 10 mg by mouth daily.     Cholecalciferol (VITAMIN D) 2000 units CAPS Take by mouth.     Magnesium 300 MG CAPS Take by mouth.     spironolactone (ALDACTONE) 100 MG tablet Take 50 mg daily as needed for swelling 45 tablet 3   valACYclovir (VALTREX) 1000 MG tablet Take 2000 mg by mouth, repeat dose in 12 hours. 20 tablet 0   No current facility-administered medications on file prior to visit.    Review of Systems:  As per HPI- otherwise negative.   Physical Examination: Vitals:   01/09/22 1320  BP: 110/72  Pulse: 91  Resp: 18  Temp: 98.2 F (36.8 C)  SpO2: 100%   Vitals:   01/09/22 1320  Weight: 132 lb (59.9 kg)   Body mass index is 24.14 kg/m. Ideal Body Weight:    GEN: no acute distress.  Normal weight, looks well HEENT: Atraumatic, Normocephalic.  Ears and Nose: No external deformity. CV: RRR, No M/G/R. No JVD. No thrill. No extra heart sounds. PULM: CTA B, no wheezes, crackles, rhonchi. No retractions. No resp. distress. No accessory muscle use. ABD: S, NT, ND, +BS. No rebound. No HSM.  Belly is benign on exam today EXTR: No c/c/e PSYCH: Normally interactive. Conversant.   Results for orders placed or performed in visit on 01/09/22  POCT urinalysis dipstick  Result Value Ref Range   Color, UA yellow yellow   Clarity, UA clear clear   Glucose, UA negative negative mg/dL   Bilirubin, UA negative negative   Ketones, POC UA negative negative mg/dL   Spec Grav, UA 1.010 1.010 - 1.025    Blood, UA negative negative   pH, UA 6.0 5.0 - 8.0   Protein Ur, POC negative negative mg/dL   Urobilinogen, UA 0.2 0.2 or 1.0 E.U./dL   Nitrite, UA Negative Negative   Leukocytes, UA Negative Negative  POCT Occult Blood Stool  Result Value Ref Range   Fecal Occult Blood, POC Negative Negative   Card #1 Date [id number]    Card #2 Fecal Occult Blod, POC     Card #2 Date  Card #3 Fecal Occult Blood, POC     Card #3 Date      Assessment and Plan: Lower abdominal pain - Plan: CBC, Comprehensive metabolic panel, Amylase, Lipase, POCT occult blood stool, device, POCT Occult Blood Stool, CANCELED: POCT occult blood stool, device  Low back pain without sciatica, unspecified back pain laterality, unspecified chronicity - Plan: POCT urinalysis dipstick  Black stool - Plan: POCT occult blood stool, device, POCT Occult Blood Stool, CANCELED: IFOBT POC (occult bld, rslt in office), CANCELED: POCT occult blood stool, device Patient seen today with concern of lower abdominal and lower back pain which was present over this past weekend, now seems to be improved.  Her exam today is benign.  I suspect she may have had a minor bout of diverticulitis.  We will obtain lab work as above and be back in touch with her  Discussed her gassiness, she feels this is due to Wellbutrin but otherwise Wellbutrin is doing a great job for her.  She would like to continue to take it  She will let me know if any worsening or recurrence of her symptoms  Will plan further follow- up pending labs.   Signed Lamar Blinks, MD  Received labs as below, message to patient  Results for orders placed or performed in visit on 01/09/22  CBC  Result Value Ref Range   WBC 5.4 4.0 - 10.5 K/uL   RBC 4.33 3.87 - 5.11 Mil/uL   Platelets 327.0 150.0 - 400.0 K/uL   Hemoglobin 13.4 12.0 - 15.0 g/dL   HCT 40.4 36.0 - 46.0 %   MCV 93.3 78.0 - 100.0 fl   MCHC 33.2 30.0 - 36.0 g/dL   RDW 12.9 11.5 - 15.5 %  Comprehensive  metabolic panel  Result Value Ref Range   Sodium 140 135 - 145 mEq/L   Potassium 4.1 3.5 - 5.1 mEq/L   Chloride 102 96 - 112 mEq/L   CO2 30 19 - 32 mEq/L   Glucose, Bld 80 70 - 99 mg/dL   BUN 14 6 - 23 mg/dL   Creatinine, Ser 0.66 0.40 - 1.20 mg/dL   Total Bilirubin 0.3 0.2 - 1.2 mg/dL   Alkaline Phosphatase 87 39 - 117 U/L   AST 14 0 - 37 U/L   ALT 12 0 - 35 U/L   Total Protein 6.4 6.0 - 8.3 g/dL   Albumin 4.2 3.5 - 5.2 g/dL   GFR 89.71 >60.00 mL/min   Calcium 9.6 8.4 - 10.5 mg/dL  Amylase  Result Value Ref Range   Amylase 50 27 - 131 U/L  Lipase  Result Value Ref Range   Lipase 54.0 11.0 - 59.0 U/L  POCT urinalysis dipstick  Result Value Ref Range   Color, UA yellow yellow   Clarity, UA clear clear   Glucose, UA negative negative mg/dL   Bilirubin, UA negative negative   Ketones, POC UA negative negative mg/dL   Spec Grav, UA 1.010 1.010 - 1.025   Blood, UA negative negative   pH, UA 6.0 5.0 - 8.0   Protein Ur, POC negative negative mg/dL   Urobilinogen, UA 0.2 0.2 or 1.0 E.U./dL   Nitrite, UA Negative Negative   Leukocytes, UA Negative Negative  POCT Occult Blood Stool  Result Value Ref Range   Fecal Occult Blood, POC Negative Negative   Card #1 Date 2092023    Card #2 Fecal Occult Blod, POC     Card #2 Date     Card #3  Fecal Occult Blood, POC     Card #3 Date

## 2022-01-09 NOTE — Patient Instructions (Addendum)
It was good to see you today  I will be in touch with your labs asap If you are getting worse again please do let me know!   Perhaps try gas-x again for your gas  I suspect you may have had a minor bout of diverticulitis

## 2022-02-23 ENCOUNTER — Encounter: Payer: Self-pay | Admitting: Family Medicine

## 2022-02-25 NOTE — Progress Notes (Signed)
Therapist, music at [facility] ?[address], [address] ?[place], [place] [address] ?336 [phone] ?Fax [phone] ? ?Date:  02/26/2022  ? ?Name:  Danielle Fitzpatrick   DOB:  [birth year]   MRN:  [id number] ? ?PCP:  Darreld Mclean, MD  ? ? ?Chief Complaint: Back Pain (Back and buttocks pain. Pt suspects sciatica./Concerns/ questions: pt would like a letter of medical necessity so that her insurance will cover sessions at [facility] and) ? ? ?History of Present Illness: ? ?Danielle Fitzpatrick is a 71 y.o. very pleasant female patient who presents with the following: ? ?Patient seen today with concern of back and buttock pain-history of hyperlipidemia, allergies, anxiety and depression ? ?Saw her most recently February 9, at that time she mentioned back pain but her biggest concern was actually abdominal pain: ?Patient seen today with concern of lower abdominal and lower back pain which was present over this past weekend, now seems to be improved.  Her exam today is benign.  I suspect she may have had a minor bout of diverticulitis.  We will obtain lab work as above and be back in touch with her ? ?We did get lumbar films about 1 year ago: ?IMPRESSION: ?Diffuse degenerative disc disease and advanced degenerative joint ?disease at L4-5 with associated grade 1 anterolisthesis at this ?level. ? ?She notes pain in the bilateral lower back and buttocks for the last several weeks ?The pain does not typically radiate any further down her legs ?No MRI imaging as of yet ? ?She has declined colon cancer screening ? ?She plans to get the bivalent covid booster at her pharmacy ?She notes she has been using a lot of tylenol and ibuprofen for her back- she thinks this may be causing some diarrhea intermittently ? ?She did a couple of massages which did seem to help at least for a bit- she is going again next week ?She would like a letter to use her [facility] which is fine  ?She declines to use steroids due  to not able to tolerate  ? ?No numbness or weakness of her legs ?No bowel or bladder incontinence issues  ? ?Patient Active Problem List  ? Diagnosis Date Noted  ? Allergic rhinitis 10/03/2019  ? Cough 10/03/2019  ? Menopausal syndrome 10/03/2019  ? Milia 10/03/2019  ? Neoplasm of uncertain behavior of skin 10/03/2019  ? 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 Anxiety 06/17/2017  ? Bilateral leg edema 06/17/2017  ? Other hemorrhoids 06/17/2017  ? Hyperlipidemia 02/12/2017  ? Symptomatic menopausal or female climacteric states 04/14/2012  ? Onychomycosis due to dermatophyte 04/14/2012  ? 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 Enthesopathy 02/09/2012  ? Other lymphedema 07/07/2011  ? ? ?Past Medical History:  ?Diagnosis Date  ? Anxiety   ? Chicken pox   ? Hyperlipidemia   ? ? ?Past Surgical History:  ?Procedure Laterality Date  ? AUGMENTATION MAMMAPLASTY    ? ? ?Social History  ? ?Tobacco Use  ? Smoking status: Never  ? Smokeless tobacco: Never  ?Vaping Use  ? Vaping Use: Never used  ?Substance Use Topics  ? Alcohol use: Yes  ?  Comment: occasionally  ? Drug use: No  ? ? ?Family History  ?Problem Relation Age of Onset  ? Depression Mother   ? Hyperlipidemia Mother   ? Hypertension Mother   ? Hypertension Father   ? Hyperlipidemia Father   ? Heart disease Father   ? Hearing loss Father   ?  Diabetes Father   ? Depression Father   ? Depression Sister   ? Alcohol abuse Brother   ? Depression Brother   ? Early death Brother   ? Heart attack Brother   ? Hypertension Brother   ? Hyperlipidemia Daughter   ? Hearing loss Maternal Grandmother   ? Diabetes Maternal Grandfather   ? Early death Maternal Grandfather   ? Heart disease Maternal Grandfather   ? Alcohol abuse Paternal Grandmother   ? Early death Paternal Grandmother   ? ? ?No Known Allergies ? ?Medication list has been reviewed and updated. ? ?Current Outpatient Medications on File Prior to Visit  ?Medication Sig Dispense Refill  ? aspirin EC 81 MG tablet Take 81 mg  by mouth.    ? B Complex Vitamins (VITAMIN-B COMPLEX PO) Take by mouth.    ? Biotin 10000 MCG TABS Take by mouth.    ? buPROPion (WELLBUTRIN XL) 300 MG 24 hr tablet Take 1 tablet (300 mg total) by mouth daily. 90 tablet 1  ? cetirizine (ZYRTEC) 10 MG tablet Take 10 mg by mouth daily.    ? Cholecalciferol (VITAMIN D) 2000 units CAPS Take by mouth.    ? Magnesium 300 MG CAPS Take by mouth.    ? spironolactone (ALDACTONE) 100 MG tablet Take 50 mg daily as needed for swelling 45 tablet 3  ? valACYclovir (VALTREX) 1000 MG tablet Take 2000 mg by mouth, repeat dose in 12 hours. 20 tablet 0  ? ?No current facility-administered medications on file prior to visit.  ? ? ?Review of Systems: ? ?As per HPI- otherwise negative. ? ?Physical Examination: ?Vitals:  ? 02/26/22 1516  ?BP: 110/70  ?Pulse: 63  ?Resp: 18  ?Temp: 98.1 ?F (36.7 ?C)  ?SpO2: 98%  ? ?Vitals:  ? ?There is no height or weight on file to calculate BMI. ?Ideal Body Weight:   ? ?GEN: no acute distress.  Normal weight, looks well ?HEENT: Atraumatic, Normocephalic.  ?Ears and Nose: No external deformity. ?CV: RRR, No M/G/R. No JVD. No thrill. No extra heart sounds. ?PULM: CTA B, no wheezes, crackles, rhonchi. No retractions. No resp. distress. No accessory muscle use. ?EXTR: No c/c/e ?PSYCH: Normally interactive. Conversant.  ?Normal thoracolumbar flexion.  Extension is somewhat reduced ?Normal bilateral lower extremity strength, sensation, reflex.  Negative straight leg raise bilaterally ?Patient notes tenderness to palpation of the lumbar paraspinous musculature bilaterally amd the sciatic notches ? ?Assessment and Plan: ?Chronic bilateral low back pain with bilateral sciatica - Plan: cyclobenzaprine (FLEXERIL) 10 MG tablet, DG Lumbar Spine Complete ? ?Patient seen today with lower back pain and sciatica.  She had lumbar films about 1 year ago, will update in case we end up needing MRI.  Gave her Flexeril to use as needed, caution regarding sedation.  She would  like to continue massage and stretch therapy which is reasonable.  If not getting better in the next few weeks consider MRI scan.  She is quite opposed to use of steroids due to adverse side effects previously ? ?Signed ?Lamar Blinks, MD ? ?

## 2022-02-26 ENCOUNTER — Ambulatory Visit: Payer: BC Managed Care – PPO | Admitting: Family Medicine

## 2022-02-26 VITALS — BP 110/70 | HR 63 | Temp 98.1°F | Resp 18

## 2022-02-26 DIAGNOSIS — G8929 Other chronic pain: Secondary | ICD-10-CM | POA: Diagnosis not present

## 2022-02-26 DIAGNOSIS — M5442 Lumbago with sciatica, left side: Secondary | ICD-10-CM

## 2022-02-26 DIAGNOSIS — M5441 Lumbago with sciatica, right side: Secondary | ICD-10-CM | POA: Diagnosis not present

## 2022-02-26 MED ORDER — CYCLOBENZAPRINE HCL 10 MG PO TABS: 10.0000 mg | ORAL_TABLET | Freq: Two times a day (BID) | ORAL | 0 refills | Status: DC | PRN

## 2022-02-26 NOTE — Patient Instructions (Signed)
It was good to see you again today- please get x-rays of your back at your convenience ?Massage and stretch therapy seems like a good idea to me ?Use the flexeril as needed for pain and spasm- however it will make you drowsy, do not use when driving ?Let me know how you are doing in 1-2 weeks  ?

## 2022-02-27 DIAGNOSIS — M47817 Spondylosis without myelopathy or radiculopathy, lumbosacral region: Secondary | ICD-10-CM | POA: Diagnosis not present

## 2022-02-27 DIAGNOSIS — M5136 Other intervertebral disc degeneration, lumbar region: Secondary | ICD-10-CM | POA: Diagnosis not present

## 2022-02-27 DIAGNOSIS — M545 Low back pain, unspecified: Secondary | ICD-10-CM | POA: Diagnosis not present

## 2022-02-27 DIAGNOSIS — M4316 Spondylolisthesis, lumbar region: Secondary | ICD-10-CM | POA: Diagnosis not present

## 2022-03-03 ENCOUNTER — Encounter: Payer: Self-pay | Admitting: Family Medicine

## 2022-04-17 ENCOUNTER — Other Ambulatory Visit: Payer: Self-pay | Admitting: Family Medicine

## 2022-05-06 DIAGNOSIS — M79671 Pain in right foot: Secondary | ICD-10-CM | POA: Diagnosis not present

## 2022-05-06 DIAGNOSIS — L84 Corns and callosities: Secondary | ICD-10-CM | POA: Diagnosis not present

## 2022-06-17 ENCOUNTER — Ambulatory Visit (INDEPENDENT_AMBULATORY_CARE_PROVIDER_SITE_OTHER): Payer: Medicare Other | Admitting: Family

## 2022-06-17 VITALS — BP 110/69 | HR 76 | Temp 98.0°F | Resp 16 | Ht 62.0 in | Wt 129.0 lb

## 2022-06-17 DIAGNOSIS — R1032 Left lower quadrant pain: Secondary | ICD-10-CM

## 2022-06-17 LAB — COMPREHENSIVE METABOLIC PANEL
ALT: 20 U/L (ref 0–35)
AST: 16 U/L (ref 0–37)
Albumin: 4.3 g/dL (ref 3.5–5.2)
Alkaline Phosphatase: 95 U/L (ref 39–117)
BUN: 8 mg/dL (ref 6–23)
CO2: 30 mEq/L (ref 19–32)
Calcium: 9.6 mg/dL (ref 8.4–10.5)
Chloride: 99 mEq/L (ref 96–112)
Creatinine, Ser: 0.63 mg/dL (ref 0.40–1.20)
GFR: 90.44 mL/min (ref >60.00)
Glucose, Bld: 91 mg/dL (ref 70–99)
Potassium: 4.2 mEq/L (ref 3.5–5.1)
Sodium: 137 mEq/L (ref 135–145)
Total Bilirubin: 0.3 mg/dL (ref 0.2–1.2)
Total Protein: 6.6 g/dL (ref 6.0–8.3)

## 2022-06-17 LAB — CBC WITH DIFFERENTIAL/PLATELET
Basophils Absolute: 0.1 10*3/uL (ref 0.0–0.1)
Basophils Relative: 1.2 % (ref 0.0–3.0)
Eosinophils Absolute: 0.1 10*3/uL (ref 0.0–0.7)
Eosinophils Relative: 1.6 % (ref 0.0–5.0)
HCT: 38.9 % (ref 36.0–46.0)
Hemoglobin: 12.9 g/dL (ref 12.0–15.0)
Lymphocytes Relative: 38.2 % (ref 12.0–46.0)
Lymphs Abs: 2 10*3/uL (ref 0.7–4.0)
MCHC: 33.2 g/dL (ref 30.0–36.0)
MCV: 95.1 fl (ref 78.0–100.0)
Monocytes Absolute: 0.5 10*3/uL (ref 0.1–1.0)
Monocytes Relative: 10.1 % (ref 3.0–12.0)
Neutro Abs: 2.5 10*3/uL (ref 1.4–7.7)
Neutrophils Relative %: 48.9 % (ref 43.0–77.0)
Platelets: 384 10*3/uL (ref 150.0–400.0)
RBC: 4.09 Mil/uL (ref 3.87–5.11)
RDW: 12.4 % (ref 11.5–15.5)
WBC: 5.1 10*3/uL (ref 4.0–10.5)

## 2022-06-17 MED ORDER — AMOXICILLIN-POT CLAVULANATE 875-125 MG PO TABS: 1.0000 | ORAL_TABLET | Freq: Two times a day (BID) | ORAL | 0 refills | Status: AC

## 2022-06-17 MED ORDER — HYDROCORTISONE ACETATE 25 MG RE SUPP: 25.0000 mg | Freq: Two times a day (BID) | RECTAL | 0 refills | Status: DC

## 2022-06-17 MED ORDER — FLUCONAZOLE 150 MG PO TABS: ORAL_TABLET | ORAL | 0 refills | Status: DC

## 2022-06-17 NOTE — Patient Instructions (Signed)
Diverticulitis  Diverticulitis is infection or inflammation of small pouches (diverticula) in the colon that form due to a condition called diverticulosis. Diverticula can trap stool (feces) and bacteria, causing infection and inflammation. Diverticulitis may cause severe stomach pain and diarrhea. It may lead to tissue damage in the colon that causes bleeding or blockage. The diverticula may also burst (rupture) and cause infected stool to enter other areas of the abdomen. What are the causes? This condition is caused by stool becoming trapped in the diverticula, which allows bacteria to grow in the diverticula. This leads to inflammation and infection. What increases the risk? You are more likely to develop this condition if you have diverticulosis. The risk increases if you: Are overweight or obese. Do not get enough exercise. Drink alcohol. Use tobacco products. Eat a diet that has a lot of red meat such as beef, pork, or lamb. Eat a diet that does not include enough fiber. High-fiber foods include fruits, vegetables, beans, nuts, and whole grains. Are over 40 years of age. What are the signs or symptoms? Symptoms of this condition may include: Pain and tenderness in the abdomen. The pain is normally located on the left side of the abdomen, but it may occur in other areas. Fever and chills. Nausea. Vomiting. Cramping. Bloating. Changes in bowel routines. Blood in your stool. How is this diagnosed? This condition is diagnosed based on: Your medical history. A physical exam. Tests to make sure there is nothing else causing your condition. These tests may include: Blood tests. Urine tests. CT scan of the abdomen. How is this treated? Most cases of this condition are mild and can be treated at home. Treatment may include: Taking over-the-counter pain medicines. Following a clear liquid diet. Taking antibiotic medicines by mouth. Resting. More severe cases may need to be treated  at a hospital. Treatment may include: Not eating or drinking. Taking prescription pain medicine. Receiving antibiotic medicines through an IV. Receiving fluids and nutrition through an IV. Surgery. When your condition is under control, your health care provider may recommend that you have a colonoscopy. This is an exam to look at the entire large intestine. During the exam, a lubricated, bendable tube is inserted into the anus and then passed into the rectum, colon, and other parts of the large intestine. A colonoscopy can show how severe your diverticula are and whether something else may be causing your symptoms. Follow these instructions at home: Medicines Take over-the-counter and prescription medicines only as told by your health care provider. These include fiber supplements, probiotics, and stool softeners. If you were prescribed an antibiotic medicine, take it as told by your health care provider. Do not stop taking the antibiotic even if you start to feel better. Ask your health care provider if the medicine prescribed to you requires you to avoid driving or using machinery. Eating and drinking  Follow a full liquid diet or another diet as directed by your health care provider. After your symptoms improve, your health care provider may tell you to change your diet. He or she may recommend that you eat a diet that contains at least 25 grams (25 g) of fiber daily. Fiber makes it easier to pass stool. Healthy sources of fiber include: Berries. One cup contains 4-8 grams of fiber. Beans or lentils. One-half cup contains 5-8 grams of fiber. Green vegetables. One cup contains 4 grams of fiber. Avoid eating red meat. General instructions Do not use any products that contain nicotine or tobacco, such as   cigarettes, e-cigarettes, and chewing tobacco. If you need help quitting, ask your health care provider. Exercise for at least 30 minutes, 3 times each week. You should exercise hard enough to  raise your heart rate and break a sweat. Keep all follow-up visits as told by your health care provider. This is important. You may need to have a colonoscopy. Contact a health care provider if: Your pain does not improve. Your bowel movements do not return to normal. Get help right away if: Your pain gets worse. Your symptoms do not get better with treatment. Your symptoms suddenly get worse. You have a fever. You vomit more than one time. You have stools that are bloody, black, or tarry. Summary Diverticulitis is infection or inflammation of small pouches (diverticula) in the colon that form due to a condition called diverticulosis. Diverticula can trap stool (feces) and bacteria, causing infection and inflammation. You are at higher risk for this condition if you have diverticulosis and you eat a diet that does not include enough fiber. Most cases of this condition are mild and can be treated at home. More severe cases may need to be treated at a hospital. When your condition is under control, your health care provider may recommend that you have an exam called a colonoscopy. This exam can show how severe your diverticula are and whether something else may be causing your symptoms. Keep all follow-up visits as told by your health care provider. This is important. This information is not intended to replace advice given to you by your health care provider. Make sure you discuss any questions you have with your health care provider. Document Revised: 08/29/2019 Document Reviewed: 08/29/2019 Elsevier Patient Education  2023 Elsevier Inc.  

## 2022-06-17 NOTE — Progress Notes (Signed)
Danielle Fitzpatrick is a 71 y.o. 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 20 tablet 0   aspirin EC 81 MG tablet Take 81 mg by mouth.     B Complex Vitamins (VITAMIN-B COMPLEX PO) Take by mouth.     Biotin 10000 MCG TABS Take by mouth.     buPROPion (WELLBUTRIN XL) 300 MG 24 hr tablet TAKE ONE TABLET BY MOUTH DAILY 90 tablet 1   cetirizine (ZYRTEC) 10 MG tablet Take 10 mg by mouth daily.     Cholecalciferol (VITAMIN D) 2000 units CAPS Take by mouth.     cyclobenzaprine (FLEXERIL) 10 MG tablet Take 1 tablet (10 mg total) by mouth 2 (two) times daily as needed for muscle spasms. 30 tablet 0   fluconazole (DIFLUCAN) 150 MG tablet Take 1 at onset of symptoms; repeat after 72 hours 2 tablet 0   hydrocortisone (ANUSOL-HC) 25 MG suppository Place 1 suppository (25 mg total) rectally 2 (two) times daily. 12 suppository 0   Magnesium 300 MG CAPS Take by mouth.     spironolactone (ALDACTONE) 100 MG tablet Take 50 mg daily as needed for swelling 45 tablet 3   valACYclovir (VALTREX) 1000 MG tablet Take 2000 mg by mouth, repeat dose in 12 hours. 20 tablet 0   No current facility-administered medications for this visit.    Allergies: Patient has no  known allergies.  Past Medical History:  Diagnosis Date   Anxiety    Chicken pox    Hyperlipidemia     Past Surgical History:  Procedure Laterality Date   AUGMENTATION MAMMAPLASTY      Family History  Problem Relation Age of Onset   Depression Mother    Hyperlipidemia Mother    Hypertension Mother    Hypertension Father    Hyperlipidemia Father    Heart disease Father    Hearing loss Father    Diabetes Father    Depression Father    Depression Sister    Alcohol abuse Brother    Depression Brother    Early death Brother    Heart attack Brother    Hypertension Brother    Hyperlipidemia Daughter    Hearing loss Maternal Grandmother    Diabetes Maternal Grandfather    Early death Maternal Grandfather    Heart disease Maternal Grandfather    Alcohol abuse Paternal Grandmother    Early death Paternal Grandmother     Social History   Tobacco Use   Smoking status: Never   Smokeless tobacco: Never  Substance Use Topics   Alcohol use: Yes    Comment: occasionally    Subjective:   LLQ/ lower abdominal pain which started last week; did experience some nausea/ decreased appetite; did notice that color of stool was different last week- "yellow" for a  few days; color has since normalized and has been experiencing intermittent constipation;  Was seen in February 2023 with lower abdominal pain- suspected mild diverticulitis flare at that time which had resolved by time of OV;  Overdue for colon cancer screen;  No vaginal spotting or bleeding;     Objective:  Vitals:   06/17/22 1022  BP: 110/69  Pulse: 76  Resp: 16  Temp: 98 F (36.7 C)  TempSrc: Oral  SpO2: 97%  Weight: 129 lb (58.5 kg)  Height: $Remove'5\' 2"'TrFevix$  (1.575 m)    General: Well developed, well nourished, in no acute distress  Skin : Warm and dry.  Head: Normocephalic and atraumatic  Eyes: Sclera and conjunctiva clear; pupils round and reactive to light; extraocular movements intact  Ears: External normal; canals  clear; tympanic membranes normal  Oropharynx: Pink, supple. No suspicious lesions  Neck: Supple without thyromegaly, adenopathy  Lungs: Respirations unlabored; clear to auscultation bilaterally without wheeze, rales, rhonchi  CVS exam: normal rate and regular rhythm.  Abdomen: Soft; nontender; nondistended; normoactive bowel sounds; no masses or hepatosplenomegaly  Neurologic: Alert and oriented; speech intact; face symmetrical; moves all extremities well; CNII-XII intact without focal deficit   Assessment:  1. Abdominal pain, LLQ   2. Left lower quadrant abdominal pain     Plan:   ? Diverticulitis; this is 2nd time patient has been seen with similar symptoms in the past 5 months; will update imaging; will start on Augmentin for suspected infection; patient is aware that she will most likely need colonoscopy since she is overdue for colon cancer screen and she is in agreement; follow up to be determined.    No follow-ups on file.  Orders Placed This Encounter  Procedures   CT Abdomen Pelvis W Contrast    Drank smoothie this morning around 7:30 am;   Call [phone]    Standing Status:   Future    Standing Expiration Date:   06/18/2023    Order Specific Question:   If indicated for the ordered procedure, I authorize the administration of contrast media per Radiology protocol    Answer:   Yes    Order Specific Question:   Preferred imaging location?    Answer:   [facility] Specific Question:   Is Oral Contrast requested for this exam?    Answer:   Yes, Per Radiology protocol   CBC with Differential/Platelet   Comp Met (CMET)    Requested Prescriptions   Signed Prescriptions Disp Refills   amoxicillin-clavulanate (AUGMENTIN) 875-125 MG tablet 20 tablet 0    Sig: Take 1 tablet by mouth 2 (two) times daily for 10 days.   fluconazole (DIFLUCAN) 150 MG tablet 2 tablet 0    Sig: Take 1 at onset of symptoms; repeat after 72 hours   hydrocortisone (ANUSOL-HC) 25 MG  suppository 12 suppository 0    Sig: Place 1 suppository (25 mg total) rectally 2 (two) times daily.

## 2022-06-18 ENCOUNTER — Ambulatory Visit (HOSPITAL_BASED_OUTPATIENT_CLINIC_OR_DEPARTMENT_OTHER)
Admission: RE | Admit: 2022-06-18 | Discharge: 2022-06-18 | Disposition: A | Discharge status: Home / Self Care | Payer: Medicare Other | Source: Ambulatory Visit | Attending: Family | Admitting: Family

## 2022-06-18 ENCOUNTER — Telehealth: Payer: Self-pay | Admitting: *Deleted

## 2022-06-18 ENCOUNTER — Encounter (HOSPITAL_BASED_OUTPATIENT_CLINIC_OR_DEPARTMENT_OTHER): Payer: Self-pay

## 2022-06-18 ENCOUNTER — Other Ambulatory Visit: Payer: Self-pay | Admitting: Family

## 2022-06-18 ENCOUNTER — Encounter: Payer: Self-pay | Admitting: *Deleted

## 2022-06-18 DIAGNOSIS — R1032 Left lower quadrant pain: Secondary | ICD-10-CM | POA: Diagnosis present

## 2022-06-18 DIAGNOSIS — K5792 Diverticulitis of intestine, part unspecified, without perforation or abscess without bleeding: Secondary | ICD-10-CM

## 2022-06-18 MED ORDER — IOHEXOL 300 MG/ML  SOLN
100.0000 mL | Freq: Once | INTRAMUSCULAR | Status: AC | PRN
  Administered 2022-06-18: 100 mL via INTRAVENOUS

## 2022-06-18 NOTE — Telephone Encounter (Signed)
-----   Message from Marrian Salvage, [place] sent at 06/18/2022  1:34 PM EDT ----- Please call to check on her today; 1) Does have diverticulitis as we suspected; finish antibiotics. I do want her to see GI for follow up and to discuss colonoscopy/ Referral will be done. 2) Arthritis changes noted in her back; if having continued back pain, would recommend orthopedist.  3) Small umbilical hernia- this would not be causing her pain but can have her meet with surgeon to discuss repair if interested. 5) Small fibroid- benign/ not concerned at this time.

## 2022-06-18 NOTE — Telephone Encounter (Signed)
Patient notified of results and stated that she feels much better.  Has a little diarrhea and stomach upset from the contrast she had to drink.  She also ate a full lunch today.  She declined appt with ortho and surgeon at this time.

## 2022-06-25 ENCOUNTER — Ambulatory Visit: Payer: BC Managed Care – PPO | Admitting: Family Medicine

## 2022-06-27 ENCOUNTER — Encounter: Payer: Self-pay | Admitting: Gastroenterology

## 2022-07-24 ENCOUNTER — Ambulatory Visit (INDEPENDENT_AMBULATORY_CARE_PROVIDER_SITE_OTHER): Payer: Medicare Other | Admitting: Gastroenterology

## 2022-07-24 ENCOUNTER — Telehealth: Payer: Self-pay | Admitting: Gastroenterology

## 2022-07-24 ENCOUNTER — Encounter: Payer: Self-pay | Admitting: Gastroenterology

## 2022-07-24 VITALS — BP 100/72 | HR 84 | Ht 62.0 in | Wt 131.4 lb

## 2022-07-24 DIAGNOSIS — R12 Heartburn: Secondary | ICD-10-CM

## 2022-07-24 DIAGNOSIS — R140 Abdominal distension (gaseous): Secondary | ICD-10-CM | POA: Diagnosis not present

## 2022-07-24 DIAGNOSIS — K5792 Diverticulitis of intestine, part unspecified, without perforation or abscess without bleeding: Secondary | ICD-10-CM

## 2022-07-24 DIAGNOSIS — Z1210 Encounter for screening for malignant neoplasm of intestinal tract, unspecified: Secondary | ICD-10-CM

## 2022-07-24 DIAGNOSIS — R194 Change in bowel habit: Secondary | ICD-10-CM

## 2022-07-24 MED ORDER — HYDROCORTISONE ACETATE 25 MG RE SUPP
25.0000 mg | Freq: Two times a day (BID) | RECTAL | 0 refills | Status: DC
Start: 2022-07-24 — End: 2023-01-22

## 2022-07-24 NOTE — Telephone Encounter (Signed)
Patient called and wanted to advise on her anusol prescription, she hasn't received yet from the pharmacy.

## 2022-07-24 NOTE — Progress Notes (Signed)
[facility] Consult Note:  History: Danielle Fitzpatrick 07/24/2022  Referring provider: Darreld Mclean, MD  Reason for consult/chief complaint: Diverticulitis (Treated by PCP, finished antibiotic around 7/27, no pain right now), Gastroesophageal Reflux (Generic antacid PRN, burning in lower throat, 78-month), and Hemorrhoids (Refill of anusol suppositories, no current pain)  No prior visits at [facility] HPI: Danielle Fitzpatrick was seen by primary care to July 18 for about a week of left lower quadrant abdominal pain suspected to be diverticulitis.  She reportedly had a brief or episode earlier in the year that had resolved by the time of office evaluation.  At the recent visit, she was prescribed Augmentin and CT abdomen and pelvis ordered with report below.  She was then advised to complete the antibiotic course and see [place] follow-up, and it was noted she has not previously had a screening colonoscopy.  She recalls doing a Cologuard 3 to 4 years ago and was told it was negative (no report on file).  Danielle Fitzpatrick says her diverticulitis symptoms improved considerably within a few days of starting the antibiotics. She has been bothered by years of bloating and what she feels to be excessive gas.  She suspects there may be dietary triggers including lactose.  Typically she would have a bowel movement every day taking fiber and using a glycerin suppository every morning.  For the last several years she has had episodic constipation with a feeling of possible hemorrhoids as she might have pain and a feeling of swelling in the anorectal area during those episodes.  She denies rectal bleeding, appetite has been good and weight stable.  Episodic heartburn treated with as needed antacid and no dysphagia nausea or vomiting.    ROS:  Review of Systems  Constitutional:  Negative for appetite change and unexpected weight change.  HENT:  Negative for mouth sores and voice change.   Eyes:   Negative for pain and redness.  Respiratory:  Negative for cough and shortness of breath.   Cardiovascular:  Negative for chest pain and palpitations.  Genitourinary:  Negative for dysuria and hematuria.  Musculoskeletal:  Positive for back pain. Negative for arthralgias and myalgias.  Skin:  Negative for pallor and rash.  Neurological:  Negative for weakness and headaches.  Hematological:  Negative for adenopathy.  Psychiatric/Behavioral:         Anxiety     Past Medical History: Past Medical History:  Diagnosis Date   Anxiety    Chicken pox    Hyperlipidemia      Past Surgical History: Past Surgical History:  Procedure Laterality Date   AUGMENTATION MAMMAPLASTY       Family History: Family History  Problem Relation Age of Onset   Depression Mother    Hyperlipidemia Mother    Hypertension Mother    Hypertension Father    Hyperlipidemia Father    Heart disease Father    Hearing loss Father    Diabetes Father    Depression Father    Rheum arthritis Father    Depression Sister    Alcohol abuse Brother    Depression Brother    Early death Brother    Heart attack Brother    Hypertension Brother    Hearing loss Maternal Grandmother    Diabetes Maternal Grandfather    Early death Maternal Grandfather    Heart disease Maternal Grandfather    Alcohol abuse Paternal Grandmother    Early death Paternal Grandmother    Hyperlipidemia Daughter  Anxiety disorder Son    Wilson's disease Neg Hx     Social History: Social History   Socioeconomic History   Marital status: Single    Spouse name: Not on file   Number of children: 2   Years of education: Not on file   Highest education level: Not on file  Occupational History   Not on file  Tobacco Use   Smoking status: Never   Smokeless tobacco: Never  Vaping Use   Vaping Use: Never used  Substance and Sexual Activity   Alcohol use: Yes    Comment: occasionally   Drug use: No   Sexual activity: Not  Currently  Other Topics Concern   Not on file  Social History Narrative   1 son and 1 daughter    Lives at home alone    Social Determinants of Health   Financial Resource Strain: Not on file  Food Insecurity: Not on file  Transportation Needs: Not on file  Physical Activity: Not on file  Stress: Not on file  Social Connections: Not on file    Allergies: No Known Allergies  Outpatient Meds: Current Outpatient Medications  Medication Sig Dispense Refill   acetaminophen (TYLENOL) 500 MG tablet Take 500 mg by mouth 3 (three) times daily as needed for moderate pain (lower back pain).     aspirin EC 81 MG tablet Take 81 mg by mouth.     B Complex Vitamins (VITAMIN-B COMPLEX PO) Take by mouth.     Biotin 10000 MCG TABS Take by mouth.     buPROPion (WELLBUTRIN XL) 300 MG 24 hr tablet TAKE ONE TABLET BY MOUTH DAILY 90 tablet 1   cetirizine (ZYRTEC) 10 MG tablet Take 10 mg by mouth daily.     Cholecalciferol (VITAMIN D) 2000 units CAPS Take by mouth.     hydrocortisone (ANUSOL-HC) 25 MG suppository Place 1 suppository (25 mg total) rectally 2 (two) times daily. 12 suppository 0   Magnesium 300 MG CAPS Take by mouth.     spironolactone (ALDACTONE) 100 MG tablet Take 50 mg daily as needed for swelling 45 tablet 3   valACYclovir (VALTREX) 1000 MG tablet Take 2000 mg by mouth, repeat dose in 12 hours. 20 tablet 0   cyclobenzaprine (FLEXERIL) 10 MG tablet Take 1 tablet (10 mg total) by mouth 2 (two) times daily as needed for muscle spasms. (Patient not taking: Reported on 07/24/2022) 30 tablet 0   fluconazole (DIFLUCAN) 150 MG tablet Take 1 at onset of symptoms; repeat after 72 hours (Patient not taking: Reported on 07/24/2022) 2 tablet 0   No current facility-administered medications for this visit.      ___________________________________________________________________ Objective   Exam:  BP 100/72 (BP Location: Left Arm, Patient Position: Sitting)   Pulse 84   Ht '5\' 2"'$  (1.575 m)    Wt 131 lb 6.4 oz (59.6 kg)   SpO2 98%   BMI 24.03 kg/m  Wt Readings from Last 3 Encounters:  07/24/22 131 lb 6.4 oz (59.6 kg)  06/17/22 129 lb (58.5 kg)  01/09/22 132 lb (59.9 kg)    General: Well-appearing Eyes: sclera anicteric, no redness ENT: oral mucosa moist without lesions, no cervical or supraclavicular lymphadenopathy CV: Regular without murmur, no JVD, no peripheral edema Resp: clear to auscultation bilaterally, normal RR and effort noted GI: soft, no tenderness, with active bowel sounds. No guarding or palpable organomegaly noted. Skin; warm and dry, no rash or jaundice noted Neuro: awake, alert and oriented x 3. Normal  gross motor function and fluent speech  Labs:     Latest Ref Rng & Units 06/17/2022   10:55 AM 01/09/2022    1:49 PM 07/22/2021    9:39 AM  CBC  WBC 4.0 - 10.5 K/uL 5.1  5.4  6.1   Hemoglobin 12.0 - 15.0 g/dL 12.9  13.4  13.7   Hematocrit 36.0 - 46.0 % 38.9  40.4  40.5   Platelets 150.0 - 400.0 K/uL 384.0  327.0  292.0       Latest Ref Rng & Units 06/17/2022   10:55 AM 01/09/2022    1:49 PM 07/22/2021    9:39 AM  CMP  Glucose 70 - 99 mg/dL 91  80  83   BUN 6 - 23 mg/dL '8  14  12   '$ Creatinine 0.40 - 1.20 mg/dL 0.63  0.66  0.66   Sodium 135 - 145 mEq/L 137  140  139   Potassium 3.5 - 5.1 mEq/L 4.2  4.1  4.2   Chloride 96 - 112 mEq/L 99  102  102   CO2 19 - 32 mEq/L '30  30  28   '$ Calcium 8.4 - 10.5 mg/dL 9.6  9.6  9.6   Total Protein 6.0 - 8.3 g/dL 6.6  6.4  6.4   Total Bilirubin 0.2 - 1.2 mg/dL 0.3  0.3  0.6   Alkaline Phos 39 - 117 U/L 95  87  84   AST 0 - 37 U/L '16  14  17   '$ ALT 0 - 35 U/L '20  12  13     '$ CLINICAL DATA:  Left lower quadrant abdominal pain over the last week. Nausea and constipation.   EXAM: CT ABDOMEN AND PELVIS WITH CONTRAST   TECHNIQUE: Multidetector CT imaging of the abdomen and pelvis was performed using the standard protocol following bolus administration of intravenous contrast.   RADIATION DOSE REDUCTION: This  exam was performed according to the departmental dose-optimization program which includes automated exposure control, adjustment of the mA and/or kV according to patient size and/or use of iterative reconstruction technique.   CONTRAST:  171m OMNIPAQUE IOHEXOL 300 MG/ML  SOLN   COMPARISON:  None Available.   FINDINGS: Lower chest: Unremarkable   Hepatobiliary: Unremarkable   Pancreas: Unremarkable   Spleen: Unremarkable   Adrenals/Urinary Tract: Unremarkable   Stomach/Bowel: Sigmoid colon diverticulosis with mild acute diverticulitis in the sigmoid colon as shown on image 36 series 5 and image 52 series 2. Surrounding mesenteric stranding noted. Gas and contrast in small adjacent diverticula but no definite extraluminal gas or abscess. Orally administered contrast extends through to the rectum.   Vascular/Lymphatic: Mild abdominal aortic atherosclerotic calcification.   Reproductive: Small calcification in the left uterine fundus likely in a small fibroid. Otherwise unremarkable.   Other: No supplemental non-categorized findings.   Musculoskeletal: 8 mm of degenerative anterolisthesis at L4-5 with associated moderate prominent central narrowing of the thecal sac. There is also left foraminal stenosis at this level due to disc bulge and disc uncovering. Lumbar spondylosis and degenerative disc disease also contribute to mild left foraminal stenosis at L3-4.   Umbilical hernia contains adipose tissue, 2.4 cm in long axis.   IMPRESSION: 1. Mild to moderate acute distal sigmoid colon diverticulitis. No extraluminal gas or abscess. 2. Lumbar impingement at L4-5 and L3-4 as detailed above. 3. Small umbilical hernia contains adipose tissue. 4.  Aortic Atherosclerosis (ICD10-I70.0). 5. Small uterine fundal fibroid.     Electronically Signed   By: WThayer Jew  Janeece Fitting M.D.   On: 06/18/2022 08:26  Assessment: Encounter Diagnoses  Name Primary?   Acute diverticulitis  Yes   Heartburn    Special screening for malignant neoplasm of intestine    Abdominal bloating    Change in bowel habits    Recent episode of acute uncomplicated diverticulitis, from which she has completely recovered after course of antibiotics.  Bloating and gas, likely dietary related, she has identified lactose as a trigger, but there may be other things as well.  Some written dietary advice was given.  Episodic uncomplicated heartburn without red flag symptoms.  Several years of a change in bowel habits with episodic constipation causing rectal pressure and pain.  This may be exacerbating hemorrhoids based on her description.  This change in bowel habit seems likely multifactorial with diverticulosis, possibly age-related descent of pelvic structures, less likely obstruction or neoplastic cause based on recent CT. She is also due for colorectal cancer screening, and I recommended she undergo a colonoscopy.  She was agreeable after thorough discussion of procedure and risks.  The benefits and risks of the planned procedure were described in detail with the patient or (when appropriate) their health care proxy.  Risks were outlined as including, but not limited to, bleeding, infection, perforation, adverse medication reaction leading to cardiac or pulmonary decompensation, pancreatitis (if ERCP).  The limitation of incomplete mucosal visualization was also discussed.  No guarantees or warranties were given.  Pending those findings, we can have a further discussion about management of episodic constipation and any hemorrhoids that may be discovered.  Thank you for the courtesy of this consult.  Please call me with any questions or concerns.  Nelida Meuse III  CC: Referring provider noted above

## 2022-07-24 NOTE — Addendum Note (Signed)
Addended by: Nelida Meuse on: 07/24/2022 12:24 PM   Modules accepted: Orders

## 2022-07-24 NOTE — Telephone Encounter (Signed)
Dr. Loletha Carrow, pt was seen in clinic today. I called and spoke with her. She mentioned that she did not stop by the lab, but wanted to know if the orders could be placed so that she can stop by at her convenience. She states that it should be for celiac labs. She also wants [place] to refill Anusol suppositories. Please advise, thanks.

## 2022-07-24 NOTE — Patient Instructions (Addendum)
_______________________________________________________ Food Guidelines for those with chronic digestive trouble:  Many people have difficulty digesting certain foods, causing a variety of distressing and embarrassing symptoms such as abdominal pain, bloating and gas.  These foods may need to be avoided or consumed in small amounts.  Here are some tips that might be helpful for you.  1.   Lactose intolerance is the difficulty or complete inability to digest lactose, the natural sugar in milk and anything made from milk.  This condition is harmless, common, and can begin any time during life.  Some people can digest a modest amount of lactose while others cannot tolerate any.  Also, not all dairy products contain equal amounts of lactose.  For example, hard cheeses such as parmesan have less lactose than soft cheeses such as cheddar.  Yogurt has less lactose than milk or cheese.  Many packaged foods (even many brands of bread) have milk, so read ingredient lists carefully.  It is difficult to test for lactose intolerance, so just try avoiding lactose as much as possible for a week and see what happens with your symptoms.  If you seem to be lactose intolerant, the best plan is to avoid it (but make sure you get calcium from another source).  The next best thing is to use lactase enzyme supplements, available over the counter everywhere.  Just know that many lactose intolerant people need to take several tablets with each serving of dairy to avoid symptoms.  Lastly, a lot of restaurant food is made with milk or butter.  Many are things you might not suspect, such as mashed potatoes, rice and pasta (cooked with butter) and "grilled" items.  If you are lactose intolerant, it never hurts to ask your server what has milk or butter.  2.   Fiber is an important part of your diet, but not all fiber is well-tolerated.  Insoluble fiber such as bran is often consumed by normal gut bacteria and converted into gas.  Soluble  fiber such as oats, squash, carrots and green beans are typically tolerated better.  3.   Some types of carbohydrates can be poorly digested.  Examples include: fructose (apples, cherries, pears, raisins and other dried fruits), fructans (onions, zucchini, large amounts of wheat), sorbitol/mannitol/xylitol and sucralose/Splenda (common artificial sweeteners), and raffinose (lentils, broccoli, cabbage, asparagus, brussel sprouts, many types of beans).  Do a Development worker, community for [employer] and you will find helpful information. Beano, a dietary supplement, will often help with raffinose-containing foods.  As with lactase tablets, you may need several per serving.  4.   Whenever possible, avoid processed food&meats and chemical additives.  High fructose corn syrup, a common sweetener, may be difficult to digest.  Eggs and soy (comes from the soybean, and added to many foods now) are other common bloating/gassy foods.  5.  Regarding gluten:  gluten is a protein mainly found in wheat, but also rye and barley.  There is a condition called celiac sprue, which is an inflammatory reaction in the small intestine causing a variety of digestive symptoms.  Blood testing is highly reliable to look for this condition, and sometimes upper endoscopy with small bowel biopsies may be necessary to make the diagnosis.  Many patients who test negative for celiac sprue report improvement in their digestive symptoms when they switch to a gluten-free diet.  However, in these "non-celiac gluten sensitive" patients, the true role of gluten in their symptoms is unclear.  Reducing carbohydrates in general may decrease the gas and  bloating caused when gut bacteria consume carbs. Also, some of these patients may actually be intolerant of the baker's yeast in bread products rather than the gluten.  Flatbread and other reduced yeast breads might therefore be tolerated.  There is no specific testing available for most food intolerances, which are  discovered mainly by dietary elimination.  Please do not embark on a gluten free diet unless directed by your doctor, as it is highly restrictive, and may lead to nutritional deficiencies if not carefully monitored.  Lastly, beware of internet claims offering "personalized" tests for food intolerances.  Such testing has no reliable scientific evidence to support its reliability and correlation to symptoms.    6.  The best advice is old advice, especially for those with chronic digestive trouble - try to eat "clean".  Balanced diet, avoid processed food, plenty of fruits and vegetables, cut down the sugar, minimal alcohol, avoid tobacco. Make time to care for yourself, get enough sleep, exercise when you can, reduce stress.  Your guts will thank you for it.   - Dr. [facility]  ____________________________________________________________

## 2022-07-24 NOTE — Telephone Encounter (Signed)
Celiac lab orders should have been placed by CMA - can drop by lab when convenient.  I had not yet finished my morning clinic work, but the [employer] suppository Rx is now done.  - HD

## 2022-08-01 ENCOUNTER — Other Ambulatory Visit (HOSPITAL_COMMUNITY): Payer: Self-pay

## 2022-08-09 IMAGING — MG DIGITAL SCREENING BREAST BILAT IMPLANT W/ TOMO W/ CAD
8 of 12 series · 8 of 28 positions shown · non-contrast
Comparison: Previous exam(s).

CLINICAL DATA: Screening.

EXAM:
DIGITAL SCREENING BILATERAL MAMMOGRAM WITH IMPLANTS, CAD AND TOMO
The patient has retropectoral implants. Standard and implant
displaced views were performed.

[R CC]
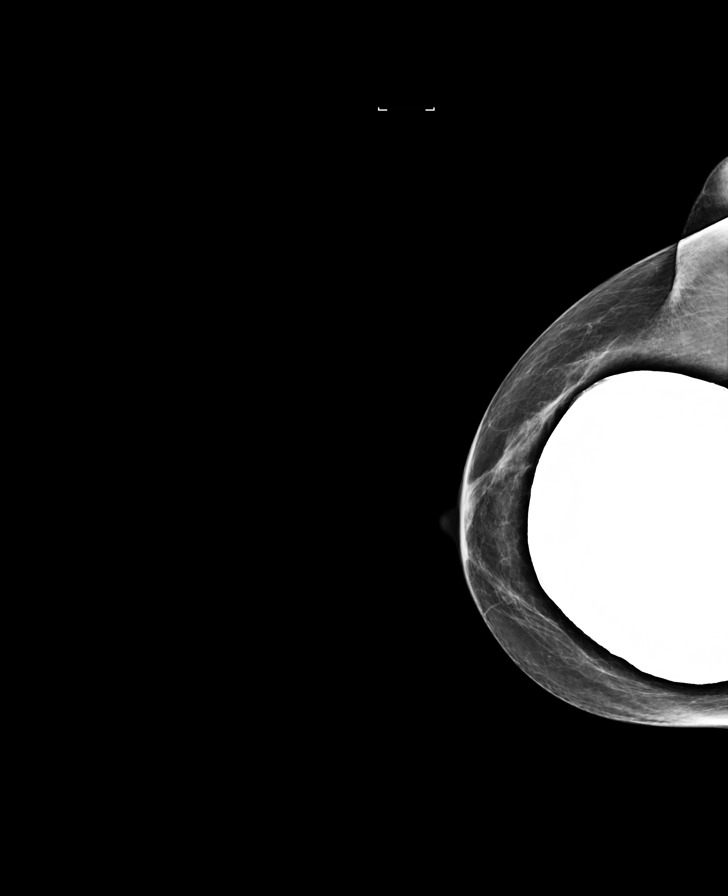

[R MLO]
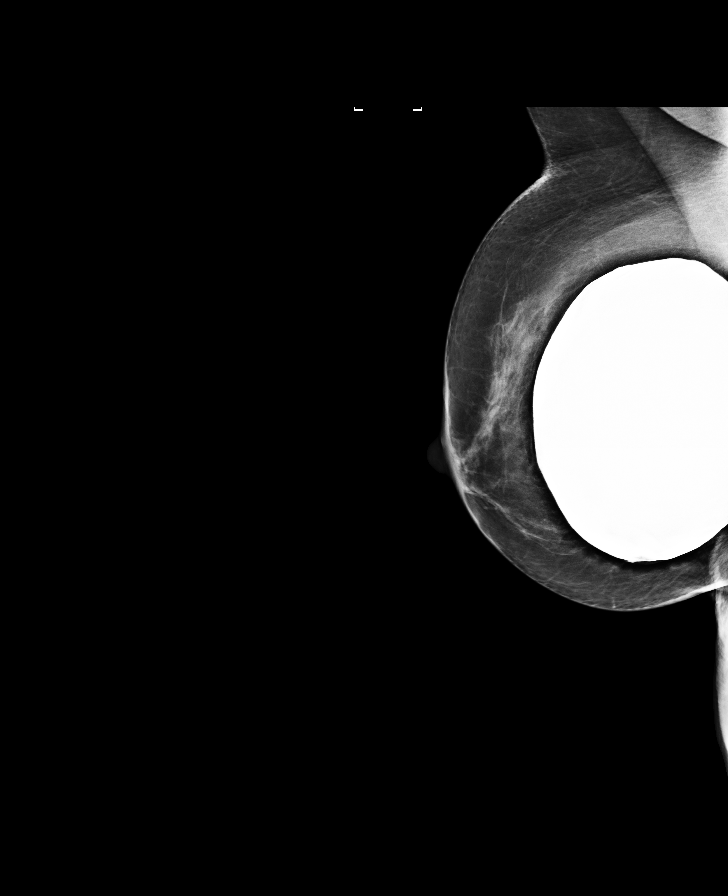

[L MLO]
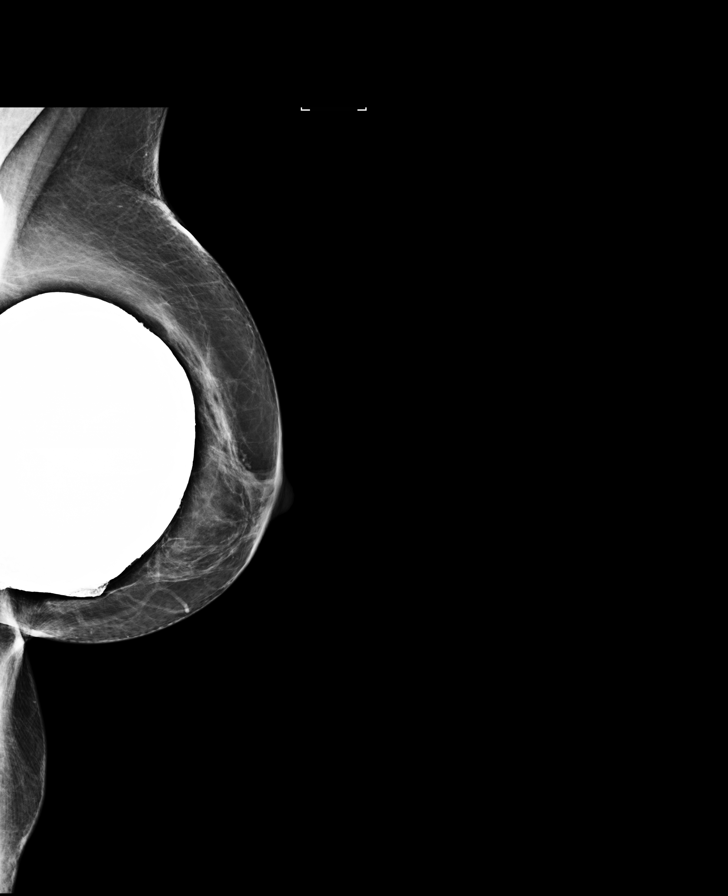

[L CC]
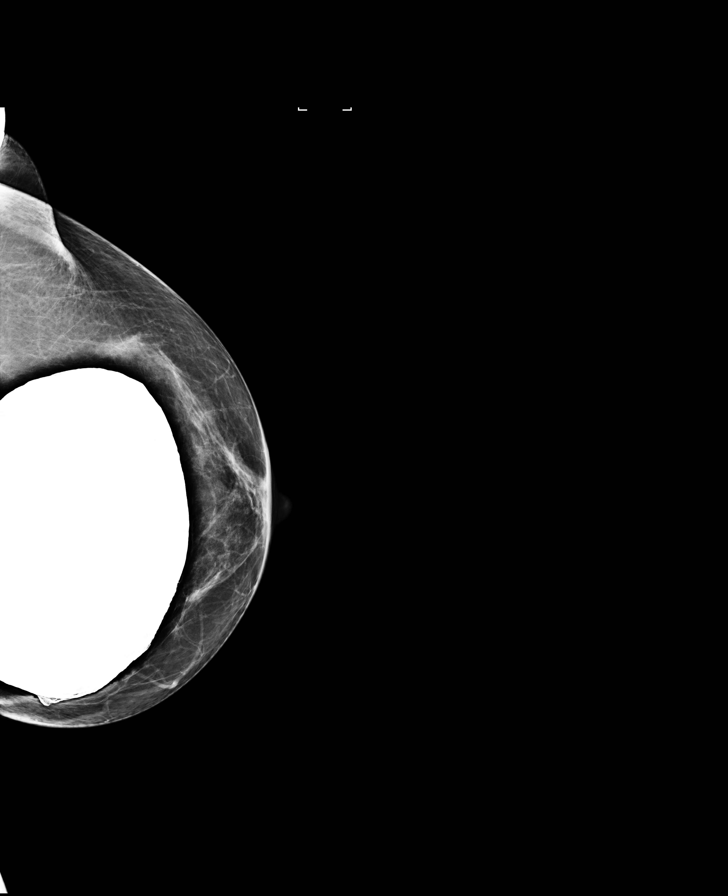

[L MLO synth-2D]
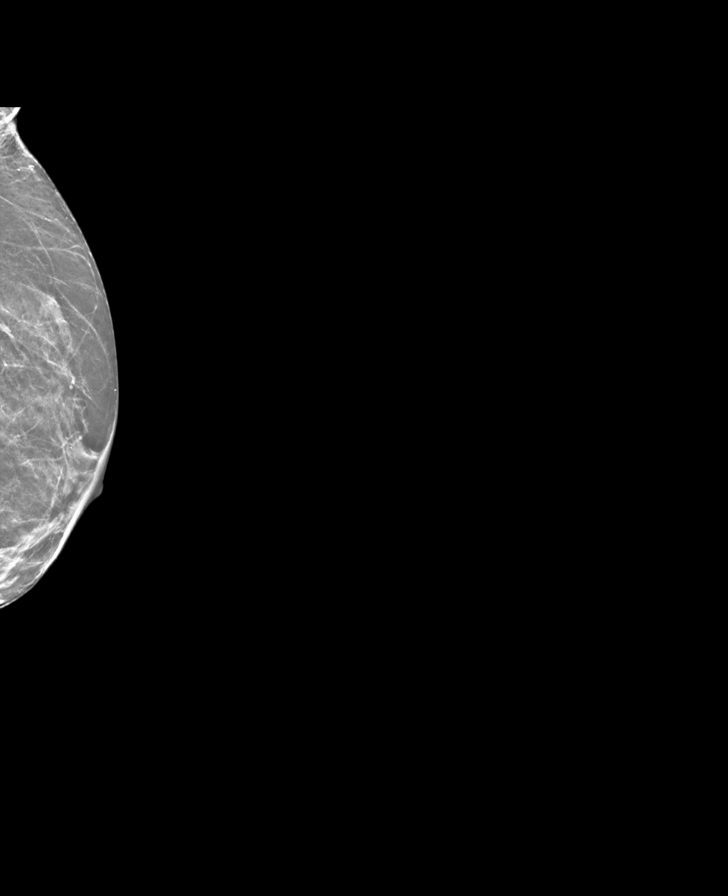

[R CC synth-2D]
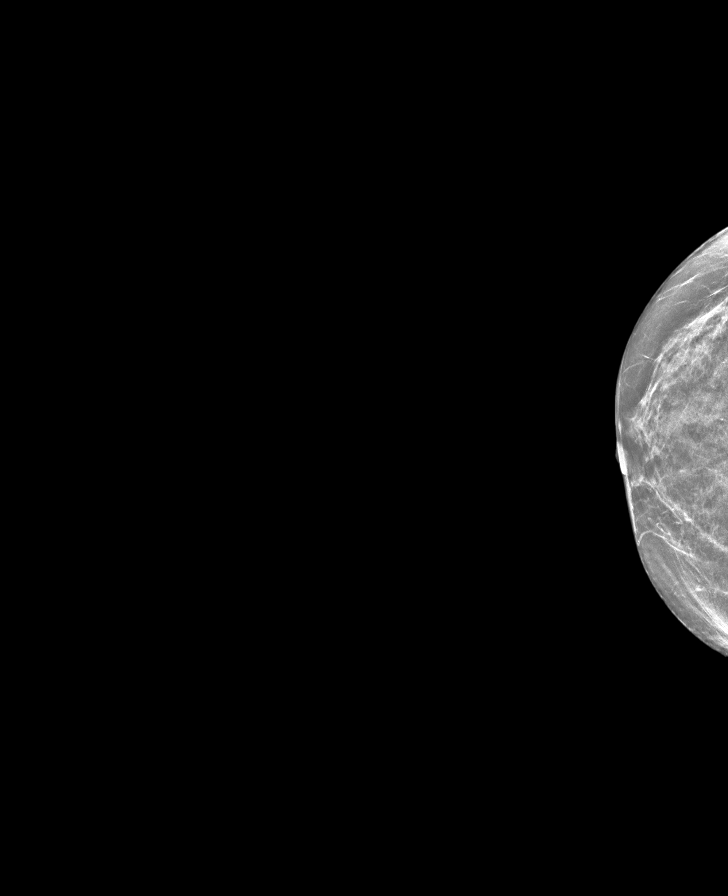

[L CC synth-2D]
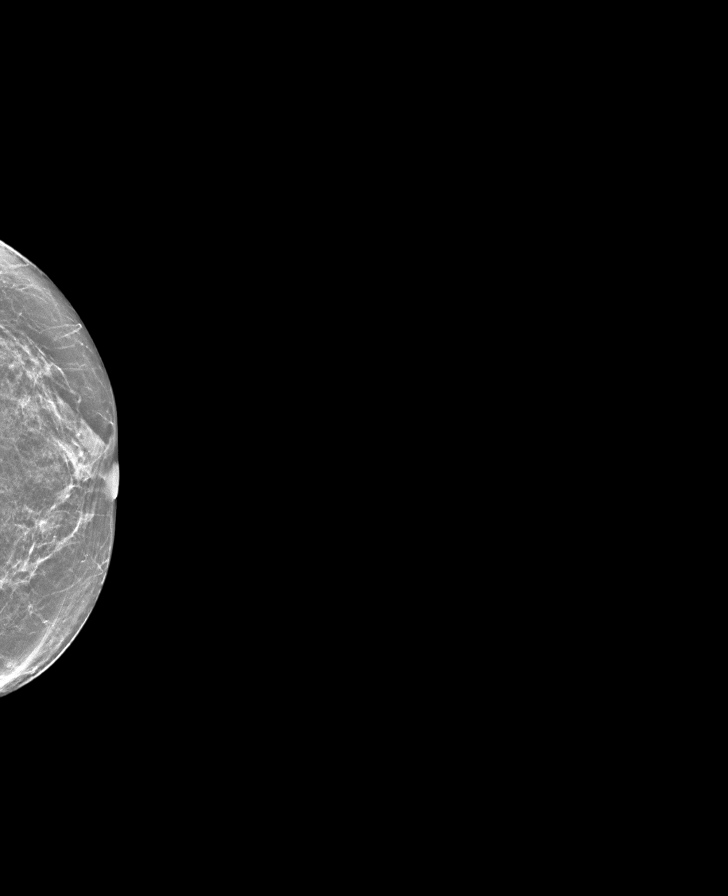

[R MLO synth-2D]
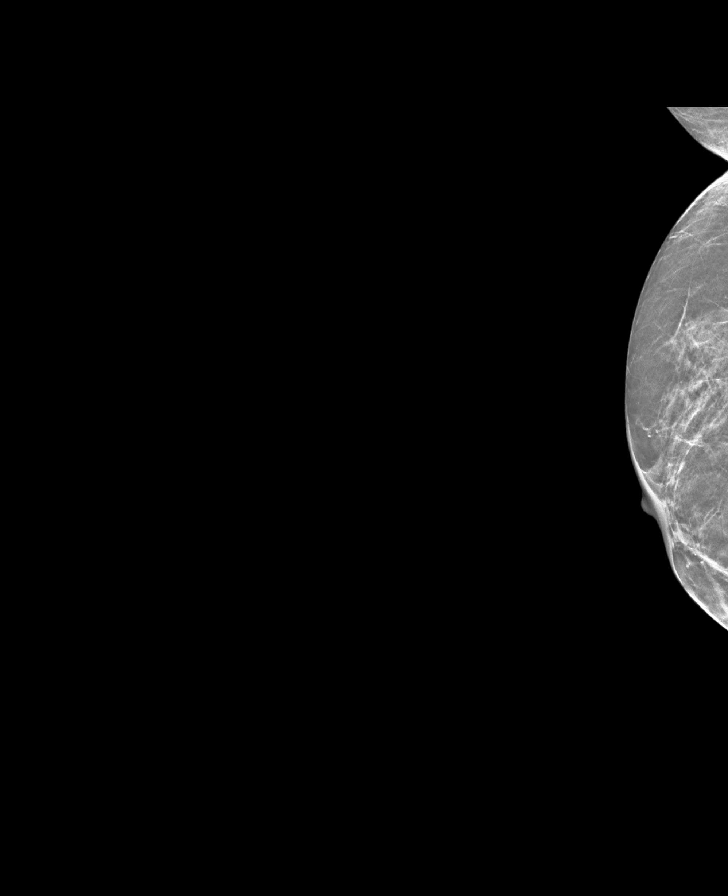

[8 of 28 positions shown; findings below may reference images not displayed]

ACR Breast Density Category b: There are scattered areas of
fibroglandular density.
FINDINGS: There are no findings suspicious for malignancy. Images were
processed with CAD.
IMPRESSION: No mammographic evidence of malignancy. A result letter of this
screening mammogram will be mailed directly to the patient.

RECOMMENDATION:
Screening mammogram in one year. (Code:[id number])

BI-RADS CATEGORY  1:  Negative.

## 2022-08-12 ENCOUNTER — Telehealth: Payer: Self-pay | Admitting: Gastroenterology

## 2022-08-12 DIAGNOSIS — R194 Change in bowel habit: Secondary | ICD-10-CM

## 2022-08-12 DIAGNOSIS — R140 Abdominal distension (gaseous): Secondary | ICD-10-CM

## 2022-08-12 DIAGNOSIS — K5792 Diverticulitis of intestine, part unspecified, without perforation or abscess without bleeding: Secondary | ICD-10-CM

## 2022-08-12 DIAGNOSIS — Z1210 Encounter for screening for malignant neoplasm of intestinal tract, unspecified: Secondary | ICD-10-CM

## 2022-08-12 MED ORDER — NA SULFATE-K SULFATE-MG SULF 17.5-3.13-1.6 GM/177ML PO SOLN: 1.0000 | Freq: Once | ORAL | 0 refills | Status: AC

## 2022-08-12 NOTE — Telephone Encounter (Signed)
The patient came into the office wanting to see times to schedule her procedure. She wanted to see if she could schedule her EGD and a procedure to get her hemorrhoids removed. I wasn't sure if she meant her colonoscopy. Which I did tell her they could do both procedures at once but she wanted to confirm with Dr.Danis that it was okay to do.

## 2022-08-12 NOTE — Telephone Encounter (Signed)
Colonoscopy scheduled in the [facility] for Tuesday, 09/23/22 at 9:30 am. Arriving in the [facility] by 8:30 am with a care partner.  Ambulatory referral to GI in epic.  SUPREP sent to [facility] on file. Colonoscopy instructions mailed and sent to patient via [facility].

## 2022-08-12 NOTE — Telephone Encounter (Signed)
Returned call to patient. I informed her that no EGD was recommended at the time of her office visit. I informed pt that she only needs a colonoscopy at this time. Pt knows that after her colonoscopy Dr. Loletha Carrow will be able to determine if she is a candidate for hemorrhoid banding. Pt is aware that hemorrhoid banding does not require a prep and will be done in the office. Pt states that she has to review some possible dates with her children. Pt requested that I send her a MyChart message with Dr. Loletha Carrow' current availability. Pt verbalized understanding and had no concerns at the end of the call.   I sent MyChart message and informed patient that the times are not guaranteed. Scheduling will be based on what is available when she calls the office to schedule.

## 2022-08-14 ENCOUNTER — Encounter: Payer: Self-pay | Admitting: Family Medicine

## 2022-09-04 ENCOUNTER — Encounter: Payer: Self-pay | Admitting: Family Medicine

## 2022-09-04 ENCOUNTER — Other Ambulatory Visit: Payer: Self-pay | Admitting: Family

## 2022-09-04 MED ORDER — FLUCONAZOLE 150 MG PO TABS: 150.0000 mg | ORAL_TABLET | Freq: Once | ORAL | 0 refills | Status: AC

## 2022-09-04 NOTE — Addendum Note (Signed)
Addended by: Lamar Blinks C on: 09/04/2022 05:05 PM   Modules accepted: Orders

## 2022-09-22 ENCOUNTER — Encounter: Payer: Self-pay | Admitting: Gastroenterology

## 2022-09-22 ENCOUNTER — Telehealth: Payer: Self-pay | Admitting: Gastroenterology

## 2022-09-22 NOTE — Telephone Encounter (Signed)
Spoke with, states she is having lower abdominal pain that is not severe. She is still able to do ADL's. States her bowel movements have changed and are now loose. She is experiencing loss of appetite and lethargy as well. States she has had these symptoms with previous episodes of diverticulitis. States she does not feel that this episode is as severe as the one she had in September. States she feel well enough to proceed with colonoscopy if okay with MD. She has been on clear liquids today per instructions. MD please advise if okay to proceed with colonoscopy on 09/23/22. Also pt asking is she should be started on an antibiotic for the diverticulitis.

## 2022-09-22 NOTE — Telephone Encounter (Signed)
This has been addressed, see 10/23 telephone encounter

## 2022-09-22 NOTE — Telephone Encounter (Signed)
Called patient, she had mild abdominal discomfort yesterday associated with nausea and also noticed some change in bowel habits with less formed stool but today she feels like her normal self also went on a 2 mile walk.  She is currently on clear liquid diet, overall she does not feel her symptoms are similar to the acute diverticulitis episode she had in July.  No fever, chills, blood in stool or vomiting. Given she does not have any significant symptoms to suggest recurrent acute diverticulitis, advised patient to proceed with bowel prep to undergo colonoscopy tomorrow, call with any changes in symptoms.  Patient was appreciated with a phone call and agreed with the recommendations.

## 2022-09-22 NOTE — Telephone Encounter (Signed)
Error

## 2022-09-22 NOTE — Telephone Encounter (Signed)
Patient states she is having an episode of diverticulitis, has procedure scheduled for tomorrow doesn't know if she should proceed. Please advise.

## 2022-09-23 ENCOUNTER — Ambulatory Visit (AMBULATORY_SURGERY_CENTER): Payer: Medicare Other | Admitting: Gastroenterology

## 2022-09-23 ENCOUNTER — Encounter: Payer: Self-pay | Admitting: Gastroenterology

## 2022-09-23 VITALS — BP 121/64 | HR 74 | Temp 97.7°F | Resp 14 | Ht 62.0 in | Wt 131.0 lb

## 2022-09-23 DIAGNOSIS — K5732 Diverticulitis of large intestine without perforation or abscess without bleeding: Secondary | ICD-10-CM

## 2022-09-23 DIAGNOSIS — R140 Abdominal distension (gaseous): Secondary | ICD-10-CM | POA: Diagnosis not present

## 2022-09-23 DIAGNOSIS — K649 Unspecified hemorrhoids: Secondary | ICD-10-CM

## 2022-09-23 DIAGNOSIS — K635 Polyp of colon: Secondary | ICD-10-CM

## 2022-09-23 DIAGNOSIS — D120 Benign neoplasm of cecum: Secondary | ICD-10-CM

## 2022-09-23 DIAGNOSIS — K5909 Other constipation: Secondary | ICD-10-CM

## 2022-09-23 MED ORDER — SODIUM CHLORIDE 0.9 % IV SOLN: 500.0000 mL | Freq: Once | INTRAVENOUS | Status: DC

## 2022-09-23 NOTE — Progress Notes (Signed)
Called to room to assist during endoscopic procedure.  Patient ID and intended procedure confirmed with present staff. Received instructions for my participation in the procedure from the performing physician.  

## 2022-09-23 NOTE — Progress Notes (Signed)
History and Physical:  This patient presents for endoscopic testing for: Encounter Diagnoses  Name Primary?   Sigmoid diverticulitis Yes   Abdominal bloating    Chronic constipation     71 year old woman here for colonoscopy due to chronic constipation, abdominal bloating and an episode of diverticulitis several months ago. She is also not had a previous screening colonoscopy.  Danielle Fitzpatrick contacted our office yesterday with symptoms outlined in a phone note addressed by Dr. Silverio Decamp, who felt it was acceptable to proceed with today's colonoscopy. Danielle Fitzpatrick tells me today the brief mild RLQ pain is gone today.   Past Medical History: Past Medical History:  Diagnosis Date   Allergy    Anxiety    Arthritis    Chicken pox    Depression    Hyperlipidemia      Past Surgical History: Past Surgical History:  Procedure Laterality Date   AUGMENTATION MAMMAPLASTY      Allergies: No Known Allergies  Outpatient Meds: Current Outpatient Medications  Medication Sig Dispense Refill   acetaminophen (TYLENOL) 500 MG tablet Take 500 mg by mouth 3 (three) times daily as needed for moderate pain (lower back pain).     aspirin EC 81 MG tablet Take 81 mg by mouth.     B Complex Vitamins (VITAMIN-B COMPLEX PO) Take by mouth.     Biotin 10000 MCG TABS Take by mouth.     buPROPion (WELLBUTRIN XL) 300 MG 24 hr tablet TAKE ONE TABLET BY MOUTH DAILY 90 tablet 1   cetirizine (ZYRTEC) 10 MG tablet Take 10 mg by mouth daily.     Cholecalciferol (VITAMIN D) 2000 units CAPS Take by mouth.     Magnesium 300 MG CAPS Take by mouth.     methylcellulose oral powder Take by mouth daily.     spironolactone (ALDACTONE) 100 MG tablet Take 50 mg daily as needed for swelling 45 tablet 3   cyclobenzaprine (FLEXERIL) 10 MG tablet Take 1 tablet (10 mg total) by mouth 2 (two) times daily as needed for muscle spasms. (Patient not taking: Reported on 07/24/2022) 30 tablet 0   fluconazole (DIFLUCAN) 150 MG tablet Take 1 at onset  of symptoms; repeat after 72 hours (Patient not taking: Reported on 07/24/2022) 2 tablet 0   hydrocortisone (ANUSOL-HC) 25 MG suppository Place 1 suppository (25 mg total) rectally 2 (two) times daily. 12 suppository 0   valACYclovir (VALTREX) 1000 MG tablet Take 2000 mg by mouth, repeat dose in 12 hours. 20 tablet 0   Current Facility-Administered Medications  Medication Dose Route Frequency Provider Last Rate Last Admin   0.9 %  sodium chloride infusion  500 mL Intravenous Once Nelida Meuse III, MD          ___________________________________________________________________ Objective   Exam:  BP 116/76   Pulse 90   Temp 97.7 F (36.5 C)   Ht '5\' 2"'$  (1.575 m)   Wt 131 lb (59.4 kg)   SpO2 98%   BMI 23.96 kg/m   CV: regular , S1/S2 Resp: clear to auscultation bilaterally, normal RR and effort noted GI: soft, no tenderness, with active bowel sounds.   Assessment: Encounter Diagnoses  Name Primary?   Sigmoid diverticulitis Yes   Abdominal bloating    Chronic constipation      Plan: Colonoscopy  The benefits and risks of the planned procedure were described in detail with the patient or (when appropriate) their health care proxy.  Risks were outlined as including, but not limited to, bleeding, infection, perforation,  adverse medication reaction leading to cardiac or pulmonary decompensation, pancreatitis (if ERCP).  The limitation of incomplete mucosal visualization was also discussed.  No guarantees or warranties were given.    The patient is appropriate for an endoscopic procedure in the ambulatory setting.   - Danielle Lund, MD

## 2022-09-23 NOTE — Op Note (Signed)
[facility] Patient Name: Danielle Fitzpatrick Procedure Date: 09/23/2022 9:46 AM MRN: [id number] Endoscopist: Mallie Mussel L. Loletha Carrow , MD Age: 71 Referring MD:  Date of Birth: [birth year] Gender: Female Account #: [id number] Procedure:                Colonoscopy Indications:              Follow-up of diverticulitis, Constipation, bloating Medicines:                Monitored Anesthesia Care Procedure:                Pre-Anesthesia Assessment:                           - Prior to the procedure, a History and Physical                            was performed, and patient medications and                            allergies were reviewed. The patient's tolerance of                            previous anesthesia was also reviewed. The risks                            and benefits of the procedure and the sedation                            options and risks were discussed with the patient.                            All questions were answered, and informed consent                            was obtained. Prior Anticoagulants: The patient has                            taken no previous anticoagulant or antiplatelet                            agents. ASA Grade Assessment: II - A patient with                            mild systemic disease. After reviewing the risks                            and benefits, the patient was deemed in                            satisfactory condition to undergo the procedure.                           After obtaining informed consent, the colonoscope  was passed under direct vision. Throughout the                            procedure, the patient's blood pressure, pulse, and                            oxygen saturations were monitored continuously. The                            Olympus CF-HQ190L [id number]) Colonoscope was                            introduced through the anus and advanced to the the                            cecum,  identified by appendiceal orifice and                            ileocecal valve. The colonoscopy was performed                            without difficulty. The patient tolerated the                            procedure well. The quality of the bowel                            preparation was good. The ileocecal valve,                            appendiceal orifice, and rectum were photographed. Scope In: 9:53:03 AM Scope Out: 10:13:01 AM Scope Withdrawal Time: 0 hours 15 minutes 6 seconds  Total Procedure Duration: 0 hours 19 minutes 58 seconds  Findings:                 The perianal and digital rectal examinations were                            normal.                           Repeat examination of right colon under NBI                            performed.                           A 10 mm polyp was found in the cecum. The polyp was                            flat. The polyp was removed with a piecemeal                            technique using a cold snare. Resection and  retrieval were complete.                           Multiple diverticula were found in the left colon.                           Internal hemorrhoids were found. The hemorrhoids                            were small.                           The exam was otherwise without abnormality on                            direct and retroflexion views. Complications:            No immediate complications. Estimated Blood Loss:     Estimated blood loss was minimal. Impression:               - One 10 mm polyp in the cecum, removed piecemeal                            using a cold snare. Resected and retrieved.                           - Diverticulosis in the left colon.                           - Internal hemorrhoids.                           - The examination was otherwise normal on direct                            and retroflexion views. Recommendation:           - Patient has a contact  number available for                            emergencies. The signs and symptoms of potential                            delayed complications were discussed with the                            patient. Return to normal activities tomorrow.                            Written discharge instructions were provided to the                            patient.                           - Resume previous diet.                           -  Continue present medications.                           - Await pathology results.                           - Repeat colonoscopy is recommended for                            surveillance. The colonoscopy date will be                            determined after pathology results from today's                            exam become available for review.                           - Return to my office at appointment to be                            scheduled. Berk Pilot L. Loletha Carrow, MD 09/23/2022 10:22:04 AM This report has been signed electronically.

## 2022-09-23 NOTE — Patient Instructions (Signed)
Please read handouts provided. Continue present medications. Await pathology results. Return to Dr. Loletha Carrow' office.   YOU HAD AN ENDOSCOPIC PROCEDURE TODAY AT [facility]:   Refer to the procedure report that was given to you for any specific questions about what was found during the examination.  If the procedure report does not answer your questions, please call your gastroenterologist to clarify.  If you requested that your care partner not be given the details of your procedure findings, then the procedure report has been included in a sealed envelope for you to review at your convenience later.  YOU SHOULD EXPECT: Some feelings of bloating in the abdomen. Passage of more gas than usual.  Walking can help get rid of the air that was put into your GI tract during the procedure and reduce the bloating. If you had a lower endoscopy (such as a colonoscopy or flexible sigmoidoscopy) you may notice spotting of blood in your stool or on the toilet paper. If you underwent a bowel prep for your procedure, you may not have a normal bowel movement for a few days.  Please Note:  You might notice some irritation and congestion in your nose or some drainage.  This is from the oxygen used during your procedure.  There is no need for concern and it should clear up in a day or so.  SYMPTOMS TO REPORT IMMEDIATELY:  Following lower endoscopy (colonoscopy or flexible sigmoidoscopy):  Excessive amounts of blood in the stool  Significant tenderness or worsening of abdominal pains  Swelling of the abdomen that is new, acute  Fever of 100F or higher.  For urgent or emergent issues, a gastroenterologist can be reached at any hour by calling [phone]. Do not use MyChart messaging for urgent concerns.    DIET:  We do recommend a small meal at first, but then you may proceed to your regular diet.  Drink plenty of fluids but you should avoid alcoholic beverages for 24 hours.  ACTIVITY:  You  should plan to take it easy for the rest of today and you should NOT DRIVE or use heavy machinery until tomorrow (because of the sedation medicines used during the test).    FOLLOW UP: Our staff will call the number listed on your records the next business day following your procedure.  We will call around 7:15- 8:00 am to check on you and address any questions or concerns that you may have regarding the information given to you following your procedure. If we do not reach you, we will leave a message.     If any biopsies were taken you will be contacted by phone or by letter within the next 1-3 weeks.  Please call us at [phone] if you have not heard about the biopsies in 3 weeks.    SIGNATURES/CONFIDENTIALITY: You and/or your care partner have signed paperwork which will be entered into your electronic medical record.  These signatures attest to the fact that that the information above on your After Visit Summary has been reviewed and is understood.  Full responsibility of the confidentiality of this discharge information lies with you and/or your care-partner.

## 2022-09-23 NOTE — Progress Notes (Signed)
To pacu, VSS. Report to [email or web address].tb 

## 2022-09-23 NOTE — Telephone Encounter (Signed)
Appreciate you attending to this.  - HD

## 2022-09-24 ENCOUNTER — Telehealth: Payer: Self-pay

## 2022-09-24 NOTE — Telephone Encounter (Signed)
Per 09/23/22 procedure report - Return to my office at appt to be scheduled.   Patient has been scheduled for a follow up with Dr. Loletha Carrow on Monday, 11/17/22 at 9:20 am. Appt information sent to patient via MyChart and mailed.

## 2022-09-24 NOTE — Telephone Encounter (Signed)
  Follow up Call-     09/23/2022    8:52 AM  Call back number  Post procedure Call Back phone  # [phone]  Permission to leave phone message Yes     Patient questions:  Do you have a fever, pain , or abdominal swelling? No. Pain Score  0 *  Have you tolerated food without any problems? Yes.    Have you been able to return to your normal activities? Yes.    Do you have any questions about your discharge instructions: Diet   No. Medications  No. Follow up visit  No.  Do you have questions or concerns about your Care? No.  Actions: * If pain score is 4 or above: No action needed, pain <4.

## 2022-09-26 ENCOUNTER — Other Ambulatory Visit: Payer: Self-pay | Admitting: Family Medicine

## 2022-09-29 ENCOUNTER — Encounter: Payer: Self-pay | Admitting: Gastroenterology

## 2022-11-17 ENCOUNTER — Ambulatory Visit: Payer: Medicare Other | Admitting: Gastroenterology

## 2022-12-09 NOTE — Progress Notes (Addendum)
[facility] [place], [place], [place] [address] [phone]  Date:  12/11/2022   Name:  Danielle Fitzpatrick   DOB:  1951/08/18   MRN:  [id number]  PCP:  Darreld Mclean, MD    Chief Complaint: Fatigue (Would like to discuss thyroid ) and Thyroid Problem   History of Present Illness:  Danielle Fitzpatrick is a 72 y.o. very pleasant female patient who presents with the following:  Patient seen today with concern of fatigue, would like to check her thyroid  Most recent visit with myself was in March- history of hyperlipidemia, allergies, anxiety and depression   Mammogram-due, ordered for today Tetanus is due-recommended at pharmacy Recommend Shingrix She had a colonoscopy per GI in October  She is having some more minor scitica type pain right now, she has changed over to water aerobics to help with her pain which seems to be helping  She wonders if she might have a thyroid problem She is feeling tired, fatigued, she had some diarrhea She has noted fatigue for 6-8 weeks She is on 300 mg of wellbutrin right now and also wonders if perhaps she needs a higher dose She has noted lack of flexibility in her left shoulder; this actually occurred about a month ago, seems to have mostly resolved at this point  She notes her mother needed a thyroidectomy but they are not sure why   Pt notes she had "a goiter" when she was in her 34s maybe but it seemed to go away and nothing else was ever done as far as her thyroid  She is having some trouble sleeping - OTC sleep meds may work for her but can leave her feeling groggy the next day   No chest pain or shortness of breath.  She notes she went for a vigorous hike over the weekend and did fine except for some hip pain Lab Results  Component Value Date   TSH 1.10 07/22/2021   Wt Readings from Last 3 Encounters:  12/11/22 134 lb 3.2 oz (60.9 kg)  09/23/22 131 lb (59.4 kg)  07/24/22  131 lb 6.4 oz (59.6 kg)     Patient Active Problem List   Diagnosis Date Noted   Allergic rhinitis 10/03/2019   Cough 10/03/2019   Menopausal syndrome 10/03/2019   Milia 10/03/2019   Neoplasm of uncertain behavior of skin 10/03/2019   Senile hyperkeratosis 01/10/2019   Anxiety 06/17/2017   Bilateral leg edema 06/17/2017   Other hemorrhoids 06/17/2017   Hyperlipidemia 02/12/2017   Symptomatic menopausal or female climacteric states 04/14/2012   Onychomycosis due to dermatophyte 04/14/2012   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   Enthesopathy 02/09/2012   Other lymphedema 07/07/2011    Past Medical History:  Diagnosis Date   Allergy    Anxiety    Arthritis    Chicken pox    Depression    Hyperlipidemia     Past Surgical History:  Procedure Laterality Date   AUGMENTATION MAMMAPLASTY      Social History   Tobacco Use   Smoking status: Never   Smokeless tobacco: Never  Vaping Use   Vaping Use: Never used  Substance Use Topics   Alcohol use: Yes    Comment: occasionally   Drug use: No    Family History  Problem Relation Age of Onset   Depression Mother    Hyperlipidemia Mother    Hypertension Mother    Hypertension Father  Hyperlipidemia Father    Heart disease Father    Hearing loss Father    Diabetes Father    Depression Father    Rheum arthritis Father    Depression Sister    Alcohol abuse Brother    Depression Brother    Early death Brother    Heart attack Brother    Hypertension Brother    Hearing loss Maternal Grandmother    Diabetes Maternal Grandfather    Early death Maternal Grandfather    Heart disease Maternal Grandfather    Alcohol abuse Paternal Grandmother    Early death Paternal Grandmother    Hyperlipidemia Daughter    Anxiety disorder Son    Wilson's disease Neg Hx    Colon cancer Neg Hx    Stomach cancer Neg Hx    Esophageal cancer Neg Hx    Rectal cancer Neg Hx     No Known Allergies  Medication list has been  reviewed and updated.  Current Outpatient Medications on File Prior to Visit  Medication Sig Dispense Refill   acetaminophen (TYLENOL) 500 MG tablet Take 500 mg by mouth 3 (three) times daily as needed for moderate pain (lower back pain).     aspirin EC 81 MG tablet Take 81 mg by mouth.     B Complex Vitamins (VITAMIN-B COMPLEX PO) Take by mouth.     Biotin 10000 MCG TABS Take by mouth.     buPROPion (WELLBUTRIN XL) 300 MG 24 hr tablet Take 1 tablet (300 mg total) by mouth daily. 90 tablet 1   cetirizine (ZYRTEC) 10 MG tablet Take 10 mg by mouth daily.     Cholecalciferol (VITAMIN D) 2000 units CAPS Take by mouth.     hydrocortisone (ANUSOL-HC) 25 MG suppository Place 1 suppository (25 mg total) rectally 2 (two) times daily. 12 suppository 0   Magnesium 300 MG CAPS Take by mouth.     methylcellulose oral powder Take by mouth daily.     spironolactone (ALDACTONE) 100 MG tablet Take 50 mg daily as needed for swelling 45 tablet 3   valACYclovir (VALTREX) 1000 MG tablet Take 2000 mg by mouth, repeat dose in 12 hours. 20 tablet 0   fluconazole (DIFLUCAN) 150 MG tablet Take 1 at onset of symptoms; repeat after 72 hours (Patient not taking: Reported on 07/24/2022) 2 tablet 0   No current facility-administered medications on file prior to visit.    Review of Systems:  As per HPI- otherwise negative.   Physical Examination: Vitals:   12/11/22 1023  BP: 114/66  Pulse: 82  Resp: 18  Temp: 97.6 F (36.4 C)  SpO2: 97%   Vitals:   12/11/22 1023  Weight: 134 lb 3.2 oz (60.9 kg)  Height: '5\' 2"'$  (1.575 m)   Body mass index is 24.55 kg/m. Ideal Body Weight: Weight in (lb) to have BMI = 25: 136.4  GEN: no acute distress.  Normal weight, looks well HEENT: Atraumatic, Normocephalic.  Ears and Nose: No external deformity. CV: RRR, No M/G/R. No JVD. No thrill. No extra heart sounds. PULM: CTA B, no wheezes, crackles, rhonchi. No retractions. No resp. distress. No accessory muscle use. ABD:  S, NT, ND, +BS. No rebound. No HSM. EXTR: No c/c/e PSYCH: Normally interactive. Conversant.  Left shoulder shows normal range of motion.  There is minimal weakness with deltoid and empty can strength testing.  Assessment and Plan: Other fatigue - Plan: TSH, T3, free, T4, free, CBC, VITAMIN D 25 Hydroxy (Vit-D Deficiency, Fractures), Vitamin B12  Mixed hyperlipidemia - Plan: Lipid panel  Screening for deficiency anemia - Plan: CBC  Pre-diabetes - Plan: Comprehensive metabolic panel, Hemoglobin A1c  Encounter for screening mammogram for malignant neoplasm of breast - Plan: MM 3D SCREEN BREAST BILATERAL  Patient seen today with concern of fatigue.  She talked to her sister and read some things on the Internet and wonders if this may be her thyroid.  I advised that the thyroid is one thing to consider but certainly there could be other causes.  We will obtain blood work as above and follow-up with results Ordered mammogram  Signed Lamar Blinks, MD  Addendum 1/12, received labs as below.  Message to patient  Results for orders placed or performed in visit on 12/11/22  TSH  Result Value Ref Range   TSH 0.96 0.35 - 5.50 uIU/mL  T3, free  Result Value Ref Range   T3, Free 5.3 (H) 2.3 - 4.2 pg/mL  T4, free  Result Value Ref Range   Free T4 1.23 0.60 - 1.60 ng/dL  CBC  Result Value Ref Range   WBC 6.5 4.0 - 10.5 K/uL   RBC 4.47 3.87 - 5.11 Mil/uL   Platelets 355.0 150.0 - 400.0 K/uL   Hemoglobin 14.5 12.0 - 15.0 g/dL   HCT 42.4 36.0 - 46.0 %   MCV 95.0 78.0 - 100.0 fl   MCHC 34.1 30.0 - 36.0 g/dL   RDW 12.6 11.5 - 15.5 %  Comprehensive metabolic panel  Result Value Ref Range   Sodium 136 135 - 145 mEq/L   Potassium 4.6 3.5 - 5.1 mEq/L   Chloride 99 96 - 112 mEq/L   CO2 29 19 - 32 mEq/L   Glucose, Bld 83 70 - 99 mg/dL   BUN 10 6 - 23 mg/dL   Creatinine, Ser 0.73 0.40 - 1.20 mg/dL   Total Bilirubin 0.4 0.2 - 1.2 mg/dL   Alkaline Phosphatase 93 39 - 117 U/L   AST 16 0 -  37 U/L   ALT 15 0 - 35 U/L   Total Protein 6.8 6.0 - 8.3 g/dL   Albumin 4.6 3.5 - 5.2 g/dL   GFR 83.56 >60.00 mL/min   Calcium 9.8 8.4 - 10.5 mg/dL  Hemoglobin A1c  Result Value Ref Range   Hgb A1c MFr Bld 5.5 4.6 - 6.5 %  Lipid panel  Result Value Ref Range   Cholesterol 284 (H) 0 - 200 mg/dL   Triglycerides 98.0 0.0 - 149.0 mg/dL   HDL 92.00 >39.00 mg/dL   VLDL 19.6 0.0 - 40.0 mg/dL   LDL Cholesterol 172 (H) 0 - 99 mg/dL   Total CHOL/HDL Ratio 3    NonHDL 191.53   VITAMIN D 25 Hydroxy (Vit-D Deficiency, Fractures)  Result Value Ref Range   VITD 47.78 30.00 - 100.00 ng/mL  Vitamin B12  Result Value Ref Range   Vitamin B-12 424 211 - 911 pg/mL

## 2022-12-11 ENCOUNTER — Ambulatory Visit (INDEPENDENT_AMBULATORY_CARE_PROVIDER_SITE_OTHER): Payer: Medicare Other | Admitting: Family Medicine

## 2022-12-11 ENCOUNTER — Encounter: Payer: Self-pay | Admitting: Family Medicine

## 2022-12-11 VITALS — BP 114/66 | HR 82 | Temp 97.6°F | Resp 18 | Ht 62.0 in | Wt 134.2 lb

## 2022-12-11 DIAGNOSIS — R7303 Prediabetes: Secondary | ICD-10-CM | POA: Diagnosis not present

## 2022-12-11 DIAGNOSIS — E782 Mixed hyperlipidemia: Secondary | ICD-10-CM | POA: Diagnosis not present

## 2022-12-11 DIAGNOSIS — Z1231 Encounter for screening mammogram for malignant neoplasm of breast: Secondary | ICD-10-CM

## 2022-12-11 DIAGNOSIS — R5383 Other fatigue: Secondary | ICD-10-CM | POA: Diagnosis not present

## 2022-12-11 DIAGNOSIS — Z130 Encounter for screening for diseases of the blood and blood-forming organs and certain disorders involving the immune mechanism: Secondary | ICD-10-CM

## 2022-12-11 LAB — COMPREHENSIVE METABOLIC PANEL
ALT: 15 U/L (ref 0–35)
AST: 16 U/L (ref 0–37)
Albumin: 4.6 g/dL (ref 3.5–5.2)
Alkaline Phosphatase: 93 U/L (ref 39–117)
BUN: 10 mg/dL (ref 6–23)
CO2: 29 mEq/L (ref 19–32)
Calcium: 9.8 mg/dL (ref 8.4–10.5)
Chloride: 99 mEq/L (ref 96–112)
Creatinine, Ser: 0.73 mg/dL (ref 0.40–1.20)
GFR: 83.56 mL/min (ref >60.00)
Glucose, Bld: 83 mg/dL (ref 70–99)
Potassium: 4.6 mEq/L (ref 3.5–5.1)
Sodium: 136 mEq/L (ref 135–145)
Total Bilirubin: 0.4 mg/dL (ref 0.2–1.2)
Total Protein: 6.8 g/dL (ref 6.0–8.3)

## 2022-12-11 LAB — VITAMIN B12: Vitamin B-12: 424 pg/mL (ref 211–911)

## 2022-12-11 LAB — CBC
HCT: 42.4 % (ref 36.0–46.0)
Hemoglobin: 14.5 g/dL (ref 12.0–15.0)
MCHC: 34.1 g/dL (ref 30.0–36.0)
MCV: 95 fl (ref 78.0–100.0)
Platelets: 355 10*3/uL (ref 150.0–400.0)
RBC: 4.47 Mil/uL (ref 3.87–5.11)
RDW: 12.6 % (ref 11.5–15.5)
WBC: 6.5 10*3/uL (ref 4.0–10.5)

## 2022-12-11 LAB — LIPID PANEL
Cholesterol: 284 mg/dL — ABNORMAL HIGH (ref 0–200)
HDL: 92 mg/dL (ref >39.00)
LDL Cholesterol: 172 mg/dL — ABNORMAL HIGH (ref 0–99)
NonHDL: 191.53
Total CHOL/HDL Ratio: 3
Triglycerides: 98 mg/dL (ref 0.0–149.0)
VLDL: 19.6 mg/dL (ref 0.0–40.0)

## 2022-12-11 LAB — VITAMIN D 25 HYDROXY (VIT D DEFICIENCY, FRACTURES): VITD: 47.78 ng/mL (ref 30.00–100.00)

## 2022-12-11 LAB — T4, FREE: Free T4: 1.23 ng/dL (ref 0.60–1.60)

## 2022-12-11 LAB — T3, FREE: T3, Free: 5.3 pg/mL — ABNORMAL HIGH (ref 2.3–4.2)

## 2022-12-11 LAB — HEMOGLOBIN A1C: Hgb A1c MFr Bld: 5.5 % (ref 4.6–6.5)

## 2022-12-11 LAB — TSH: TSH: 0.96 u[IU]/mL (ref 0.35–5.50)

## 2022-12-11 NOTE — Patient Instructions (Addendum)
It was good to see you today-I will be in touch with your labs asap Recommend shingles series if not done already, and a tetanus booster at your pharmacy (which is where medicare will cover these services)

## 2022-12-12 ENCOUNTER — Encounter: Payer: Self-pay | Admitting: Family Medicine

## 2022-12-12 DIAGNOSIS — R5383 Other fatigue: Secondary | ICD-10-CM

## 2022-12-12 DIAGNOSIS — E782 Mixed hyperlipidemia: Secondary | ICD-10-CM

## 2022-12-12 MED ORDER — BUPROPION HCL ER (XL) 150 MG PO TB24: 450.0000 mg | ORAL_TABLET | Freq: Every day | ORAL | 3 refills | Status: DC

## 2022-12-12 NOTE — Addendum Note (Signed)
Addended by: Lamar Blinks C on: 12/12/2022 05:32 PM   Modules accepted: Orders

## 2022-12-16 ENCOUNTER — Encounter (HOSPITAL_BASED_OUTPATIENT_CLINIC_OR_DEPARTMENT_OTHER): Payer: Self-pay

## 2022-12-16 ENCOUNTER — Encounter (INDEPENDENT_AMBULATORY_CARE_PROVIDER_SITE_OTHER): Payer: Medicare Other | Admitting: Family Medicine

## 2022-12-16 ENCOUNTER — Other Ambulatory Visit: Payer: Self-pay | Admitting: Family Medicine

## 2022-12-16 ENCOUNTER — Ambulatory Visit (HOSPITAL_BASED_OUTPATIENT_CLINIC_OR_DEPARTMENT_OTHER)
Admission: RE | Admit: 2022-12-16 | Discharge: 2022-12-16 | Disposition: A | Discharge status: Home / Self Care | Payer: Medicare Other | Source: Ambulatory Visit | Attending: Family Medicine | Admitting: Family Medicine

## 2022-12-16 DIAGNOSIS — R109 Unspecified abdominal pain: Secondary | ICD-10-CM | POA: Diagnosis not present

## 2022-12-16 DIAGNOSIS — R5383 Other fatigue: Secondary | ICD-10-CM

## 2022-12-16 DIAGNOSIS — Z130 Encounter for screening for diseases of the blood and blood-forming organs and certain disorders involving the immune mechanism: Secondary | ICD-10-CM

## 2022-12-16 DIAGNOSIS — Z1231 Encounter for screening mammogram for malignant neoplasm of breast: Secondary | ICD-10-CM | POA: Diagnosis present

## 2022-12-16 DIAGNOSIS — R7303 Prediabetes: Secondary | ICD-10-CM

## 2022-12-16 DIAGNOSIS — E782 Mixed hyperlipidemia: Secondary | ICD-10-CM

## 2022-12-16 MED ORDER — AMOXICILLIN-POT CLAVULANATE 875-125 MG PO TABS: 1.0000 | ORAL_TABLET | Freq: Two times a day (BID) | ORAL | 0 refills | Status: DC

## 2022-12-16 NOTE — Telephone Encounter (Signed)

## 2022-12-29 ENCOUNTER — Other Ambulatory Visit: Payer: Self-pay | Admitting: Family

## 2022-12-29 MED ORDER — FLUCONAZOLE 150 MG PO TABS: ORAL_TABLET | ORAL | 0 refills | Status: DC

## 2023-01-15 ENCOUNTER — Other Ambulatory Visit: Payer: Self-pay | Admitting: Family Medicine

## 2023-01-15 DIAGNOSIS — R600 Localized edema: Secondary | ICD-10-CM

## 2023-01-21 NOTE — Progress Notes (Signed)
[facility] Progress Note  Chief Complaint:  No chief complaint on file.   Subjective  History: I last saw her in August 2023 after being treated by PCP for diverticulitis and chronic constipation. Recent episode of acute uncomplicated diverticulitis, from which she has completely recovered after course of antibiotics. Bloating and gas, likely dietary related, she has identified lactose as a trigger, but there may be other things as well.  Some written dietary advice was given. Episodic uncomplicated heartburn without red flag symptoms. Several years of a change in bowel habits with episodic constipation causing rectal pressure and pain.  This may be exacerbating hemorrhoids based on her description.  This change in bowel habit seems likely multifactorial with diverticulosis, possibly age-related descent of pelvic structures, less likely obstruction or neoplastic cause based on recent CT. Colonoscopy 09/23/22 showed left-sided diverticulosis, internal hemorrhoids, and a 83m flat cecal SSP without dysplasia.  She contact primary care x1 month ago with lower abdominal pain. They gave her Augmentin. She contacted them again x1 week later and pain was better with Augmentin, but was having diarrhea x1 month. No imaging was done.  ***  ROS: Review of Systems  Constitutional:  Negative for appetite change and fever.  HENT:  Negative for trouble swallowing.   Respiratory:  Negative for cough and shortness of breath.   Cardiovascular:  Negative for chest pain.  Gastrointestinal:  Negative for abdominal distention, abdominal pain, anal bleeding, blood in stool, constipation, diarrhea, nausea, rectal pain and vomiting.  Genitourinary:  Negative for dysuria.  Musculoskeletal:  Negative for back pain.  Skin:  Negative for rash.  Neurological:  Negative for weakness.  All other systems reviewed and are negative.    The patient's Past Medical, Family and Social History were reviewed  and are on file in the EMR.  Objective:  Med list reviewed  Current Outpatient Medications:    acetaminophen (TYLENOL) 500 MG tablet, Take 500 mg by mouth 3 (three) times daily as needed for moderate pain (lower back pain)., Disp: , Rfl:    amoxicillin-clavulanate (AUGMENTIN) 875-125 MG tablet, Take 1 tablet by mouth 2 (two) times daily., Disp: 20 tablet, Rfl: 0   aspirin EC 81 MG tablet, Take 81 mg by mouth., Disp: , Rfl:    B Complex Vitamins (VITAMIN-B COMPLEX PO), Take by mouth., Disp: , Rfl:    Biotin 10000 MCG TABS, Take by mouth., Disp: , Rfl:    buPROPion (WELLBUTRIN XL) 150 MG 24 hr tablet, Take 3 tablets (450 mg total) by mouth daily., Disp: 270 tablet, Rfl: 3   cetirizine (ZYRTEC) 10 MG tablet, Take 10 mg by mouth daily., Disp: , Rfl:    Cholecalciferol (VITAMIN D) 2000 units CAPS, Take by mouth., Disp: , Rfl:    fluconazole (DIFLUCAN) 150 MG tablet, Take 1 at onset of symptoms; repeat after 72 hours, Disp: 2 tablet, Rfl: 0   hydrocortisone (ANUSOL-HC) 25 MG suppository, Place 1 suppository (25 mg total) rectally 2 (two) times daily., Disp: 12 suppository, Rfl: 0   Magnesium 300 MG CAPS, Take by mouth., Disp: , Rfl:    methylcellulose oral powder, Take by mouth daily., Disp: , Rfl:    spironolactone (ALDACTONE) 100 MG tablet, TAKE 1/2 TABLET BY MOUTH DAILY AS NEEDED FOR SWELLING, Disp: 45 tablet, Rfl: 3   valACYclovir (VALTREX) 1000 MG tablet, Take 2000 mg by mouth, repeat dose in 12 hours., Disp: 20 tablet, Rfl: 0   Vital signs in last 24 hrs: There were no vitals  filed for this visit. Wt Readings from Last 3 Encounters:  12/11/22 134 lb 3.2 oz (60.9 kg)  09/23/22 131 lb (59.4 kg)  07/24/22 131 lb 6.4 oz (59.6 kg)     Physical Exam General: well-appearing *** HEENT: sclera anicteric, oral mucosa moist without lesions Neck: supple, no thyromegaly, JVD or lymphadenopathy Cardiac: ***,  no peripheral edema Pulm: clear to auscultation bilaterally, normal RR and effort  noted Abdomen: soft, *** tenderness, with active bowel sounds. No guarding or palpable hepatosplenomegaly. Skin: warm and dry, no jaundice or rash  Labs:   ___________________________________________ Radiologic studies:   ____________________________________________ Other:   _____________________________________________ Assessment & Plan   Assessment: No diagnosis found.  ***   Plan: ***   *** minutes were spent on this encounter (including chart review, history/exam, counseling/coordination of care, and documentation) > 50% of that time was spent on counseling and coordination of care.    I,Alexis Herring,acting as a Education administrator for Crawford, MD.,have documented all relevant documentation on the behalf of Doran Stabler, MD,as directed by  Doran Stabler, MD while in the presence of Doran Stabler, MD.   Eugene Gavia

## 2023-01-22 ENCOUNTER — Telehealth: Payer: Self-pay | Admitting: Gastroenterology

## 2023-01-22 ENCOUNTER — Ambulatory Visit (INDEPENDENT_AMBULATORY_CARE_PROVIDER_SITE_OTHER): Payer: Medicare Other | Admitting: Gastroenterology

## 2023-01-22 ENCOUNTER — Encounter: Payer: Self-pay | Admitting: Gastroenterology

## 2023-01-22 VITALS — BP 106/70 | HR 84 | Ht 61.5 in | Wt 132.4 lb

## 2023-01-22 DIAGNOSIS — K5732 Diverticulitis of large intestine without perforation or abscess without bleeding: Secondary | ICD-10-CM | POA: Diagnosis not present

## 2023-01-22 DIAGNOSIS — K648 Other hemorrhoids: Secondary | ICD-10-CM | POA: Diagnosis not present

## 2023-01-22 DIAGNOSIS — K529 Noninfective gastroenteritis and colitis, unspecified: Secondary | ICD-10-CM

## 2023-01-22 MED ORDER — HYDROCORTISONE ACETATE 25 MG RE SUPP: 25.0000 mg | Freq: Two times a day (BID) | RECTAL | 0 refills | Status: DC

## 2023-01-22 NOTE — Telephone Encounter (Signed)
PT wants to schedule a hemorrhoid banding. Please advise.

## 2023-01-22 NOTE — Patient Instructions (Addendum)
_______________________________________________________  If your blood pressure at your visit was 140/90 or greater, please contact your primary care physician to follow up on this.  _______________________________________________________  If you are age 70 or older, your body mass index should be between 23-30. Your Body mass index is 24.61 kg/m. If this is out of the aforementioned range listed, please consider follow up with your Primary Care Provider.  If you are age 33 or younger, your body mass index should be between 19-25. Your Body mass index is 24.61 kg/m. If this is out of the aformentioned range listed, please consider follow up with your Primary Care Provider.   ________________________________________________________  The [facility] providers would like to encourage you to use [facility] to communicate with providers for non-urgent requests or questions.  Due to long hold times on the telephone, sending your provider a message by [facility] may be a faster and more efficient way to get a response.  Please allow 48 business hours for a response.  Please remember that this is for non-urgent requests.  _______________________________________________________  We have refilled your anusol.   It was a pleasure to see you today!  Thank you for trusting me with your gastrointestinal care!

## 2023-01-22 NOTE — Telephone Encounter (Signed)
Shon, this can be scheduled as a follow up appointment and in appt notes it should state hemorrhoid banding. Dr. Loletha Carrow does not have a specific hemorrhoid banding spot. Thanks

## 2023-01-22 NOTE — Telephone Encounter (Signed)
Thank you. Hemorrhoid banding is scheduled for 03/13/2023 1040am

## 2023-02-16 ENCOUNTER — Telehealth: Payer: Self-pay

## 2023-02-16 NOTE — Telephone Encounter (Signed)
PA request received via CMM for Anucort-HC 25MG  suppositories  PA submitted to [facility] and DENIED due to:   The medication you have requested is not covered by Medicare Part D Law. If you believe the medication is being used for a medically accepted or compendia supported indication approved by CMS, please contact your patient's plan.  Key: [id number] [id number]

## 2023-03-04 ENCOUNTER — Other Ambulatory Visit: Payer: Self-pay

## 2023-03-13 ENCOUNTER — Encounter: Payer: Medicare Other | Admitting: Gastroenterology

## 2023-03-18 ENCOUNTER — Ambulatory Visit (INDEPENDENT_AMBULATORY_CARE_PROVIDER_SITE_OTHER): Payer: Medicare Other | Admitting: Family Medicine

## 2023-03-18 ENCOUNTER — Ambulatory Visit (HOSPITAL_BASED_OUTPATIENT_CLINIC_OR_DEPARTMENT_OTHER)
Admission: RE | Admit: 2023-03-18 | Discharge: 2023-03-18 | Disposition: A | Discharge status: Home / Self Care | Payer: Medicare Other | Source: Ambulatory Visit | Attending: Family Medicine | Admitting: Family Medicine

## 2023-03-18 VITALS — BP 112/60 | HR 86 | Temp 97.9°F | Ht 62.0 in | Wt 133.2 lb

## 2023-03-18 DIAGNOSIS — G8929 Other chronic pain: Secondary | ICD-10-CM

## 2023-03-18 DIAGNOSIS — M5441 Lumbago with sciatica, right side: Secondary | ICD-10-CM | POA: Insufficient documentation

## 2023-03-18 DIAGNOSIS — M5442 Lumbago with sciatica, left side: Secondary | ICD-10-CM | POA: Insufficient documentation

## 2023-03-18 DIAGNOSIS — M7582 Other shoulder lesions, left shoulder: Secondary | ICD-10-CM

## 2023-03-18 NOTE — Patient Instructions (Signed)
Good to see you today- I sent a referral to [facility] since this is a new issue for them, but should be fine for you to call and set up an appt I think you should see Dr Ethelene Hal for your back.  He may also be able to help w your shoulder or he may refer you to one of his partners for that issue  Work on some of the exercises and stretches I gave you for your left shoulder as well I will be in touch with your x-ray reports asap

## 2023-03-18 NOTE — Progress Notes (Signed)
[facility] [address] [place], [place] [address] [phone]  Date:  03/18/2023   Name:  Danielle Fitzpatrick   DOB:  07/03/1951   MRN:  [id number]  PCP:  Pearline Cables, MD    Chief Complaint: shoulder and back pain (Pt says she would like to discuss options since this has been ongoing for a while. )   History of Present Illness:  Danielle Fitzpatrick is a 72 y.o. very pleasant female patient who presents with the following:  Pt seen today with concern of shoulder pain- history of hyperlipidemia, allergies, anxiety and depression  Last seen by myself in January. She did mention a recent issue with her left shoulder at that time, but it had mostly resolved and she had normal ROM   She continues to have left shoulder pain with external rotation, flexion and abduction It will pop and crack at times She has been seeing a chiro who is working on her back, but not her shoulder  She has gotten better with chiropractic treatment overall but her shoulder is still bothering her a lot It has bothered her for 6 months or more Will wax and wane She did injure her shoulder as a child- she broke "my shoulder bone" at age 66 and was in a cast   She has had lower back pain off and on for several years The pain may run down her legs/ into her buttocks as well No numbness or weakness No change in  She is doing some pilates which does help    She is a pt at [facility]   Colonoscopy done 2023  Patient Active Problem List   Diagnosis Date Noted   Allergic rhinitis 10/03/2019   Cough 10/03/2019   Menopausal syndrome 10/03/2019   Milia 10/03/2019   Neoplasm of uncertain behavior of skin 10/03/2019   Senile hyperkeratosis 01/10/2019   Anxiety 06/17/2017   Bilateral leg edema 06/17/2017   Other hemorrhoids 06/17/2017   Hyperlipidemia 02/12/2017   Symptomatic menopausal or female climacteric states 04/14/2012   Onychomycosis due to  dermatophyte 04/14/2012   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   Enthesopathy 02/09/2012   Other lymphedema 07/07/2011    Past Medical History:  Diagnosis Date   Allergy    Anxiety    Arthritis    Chicken pox    Depression    Diverticulitis    Diverticulosis    Hyperlipidemia     Past Surgical History:  Procedure Laterality Date   AUGMENTATION MAMMAPLASTY      Social History   Tobacco Use   Smoking status: Never   Smokeless tobacco: Never  Vaping Use   Vaping Use: Never used  Substance Use Topics   Alcohol use: Yes    Comment: occasionally   Drug use: No    Family History  Problem Relation Age of Onset   Depression Mother    Hyperlipidemia Mother    Hypertension Mother    Hypertension Father    Hyperlipidemia Father    Heart disease Father    Hearing loss Father    Diabetes Father    Depression Father    Rheum arthritis Father    Depression Sister    Alcohol abuse Brother    Depression Brother    Early death Brother    Heart attack Brother    Hypertension Brother    Hearing loss Maternal Grandmother    Diabetes Maternal Grandfather  Early death Maternal Grandfather    Heart disease Maternal Grandfather    Alcohol abuse Paternal Grandmother    Early death Paternal Grandmother    Hyperlipidemia Daughter    Anxiety disorder Son    Wilson's disease Neg Hx    Colon cancer Neg Hx    Stomach cancer Neg Hx    Esophageal cancer Neg Hx    Rectal cancer Neg Hx     No Known Allergies  Medication list has been reviewed and updated.  Current Outpatient Medications on File Prior to Visit  Medication Sig Dispense Refill   acetaminophen (TYLENOL) 500 MG tablet Take 500 mg by mouth 3 (three) times daily as needed for moderate pain (lower back pain).     aspirin EC 81 MG tablet Take 81 mg by mouth.     B Complex Vitamins (VITAMIN-B COMPLEX PO) Take by mouth.     Biotin 40981 MCG TABS Take by mouth.     buPROPion (WELLBUTRIN XL) 150 MG 24 hr tablet  Take 3 tablets (450 mg total) by mouth daily. 270 tablet 3   cetirizine (ZYRTEC) 10 MG tablet Take 10 mg by mouth daily.     Cholecalciferol (VITAMIN D) 2000 units CAPS Take by mouth.     Cyanocobalamin 1000 MCG TBCR Take by mouth.     fluconazole (DIFLUCAN) 150 MG tablet Take 1 at onset of symptoms; repeat after 72 hours 2 tablet 0   hydrocortisone (ANUSOL-HC) 25 MG suppository Place 1 suppository (25 mg total) rectally 2 (two) times daily. 12 suppository 0   Magnesium 300 MG CAPS Take by mouth.     methylcellulose oral powder Take by mouth daily.     spironolactone (ALDACTONE) 100 MG tablet TAKE 1/2 TABLET BY MOUTH DAILY AS NEEDED FOR SWELLING 45 tablet 3   valACYclovir (VALTREX) 1000 MG tablet Take 2000 mg by mouth, repeat dose in 12 hours. 20 tablet 0   No current facility-administered medications on file prior to visit.    Review of Systems:  As per HPI- otherwise negative.   Physical Examination: Vitals:   03/18/23 0820  BP: 112/60  Pulse: 86  Temp: 97.9 F (36.6 C)  SpO2: 95%   Vitals:   03/18/23 0820  Weight: 133 lb 3.2 oz (60.4 kg)  Height: 5\' 2"  (1.575 m)   Body mass index is 24.36 kg/m. Ideal Body Weight: Weight in (lb) to have BMI = 25: 136.4  GEN: no acute distress. Looks well, normal weight  HEENT: Atraumatic, Normocephalic.  Ears and Nose: No external deformity. CV: RRR, No M/G/R. No JVD. No thrill. No extra heart sounds. PULM: CTA B, no wheezes, crackles, rhonchi. No retractions. No resp. distress. No accessory muscle use. EXTR: No c/c/e PSYCH: Normally interactive. Conversant.  Left shoulder: full normal ROM with no particular pain, normal strength and sensation, normal biceps DTR.  RCT testing is negative  Back exam:  normal TL flexion and extension Normal BLE strength, sensation and DTR Negative SLR   Assessment and Plan: Rotator cuff tendonitis, left - Plan: Ambulatory referral to Orthopedic Surgery, CANCELED: Ambulatory referral to Orthopedic  Surgery  Chronic bilateral low back pain with bilateral sciatica - Plan: DG Lumbar Spine Complete, Ambulatory referral to Orthopedic Surgery, CANCELED: Ambulatory referral to Orthopedic Surgery  Patient seen today with concern of left shoulder pain x 6 months or so.  She notes discomfort with range of motion would suggest rotator cuff tendinitis.  However, her exam is basically benign.  Suspect she has tendinitis,  no evidence of tear. Also, she notes lower back pain for a few years.  This will wax and wane, currently is worse than normal.  She does have sciatica into both lower extremities.  We will go ahead and update her lower back x-rays today.  Will make referral for her to see physiatrist, Dr. Ethelene Hal at [facility].  I will see if he also can help her with her shoulder issue, she may benefit from an injection  Will plan further follow- up pending x-rays   Signed Abbe Amsterdam, MD

## 2023-03-21 ENCOUNTER — Encounter: Payer: Self-pay | Admitting: Family Medicine

## 2023-03-23 ENCOUNTER — Other Ambulatory Visit: Payer: Self-pay | Admitting: Family Medicine

## 2023-04-01 ENCOUNTER — Ambulatory Visit (INDEPENDENT_AMBULATORY_CARE_PROVIDER_SITE_OTHER): Payer: Medicare Other | Admitting: Gastroenterology

## 2023-04-01 ENCOUNTER — Encounter: Payer: Self-pay | Admitting: Gastroenterology

## 2023-04-01 VITALS — BP 104/68 | HR 78 | Ht 62.0 in | Wt 132.4 lb

## 2023-04-01 DIAGNOSIS — K648 Other hemorrhoids: Secondary | ICD-10-CM | POA: Diagnosis not present

## 2023-04-01 NOTE — Progress Notes (Signed)
Remains symptomatic from int HR with pressure and prolapse - no bleeding.  Discussed banding and she wished to proceed.    PROCEDURE NOTE: The patient presents with symptomatic grade 2  hemorrhoids, requesting rubber band ligation of his/her hemorrhoidal disease.  All risks, benefits and alternative forms of therapy were described and informed consent was obtained.  DRE revealed: nml  Anoscopy:  one large RP column, much smaller RA and LL columns   The anorectum was pre-medicated with 0.125% NTG and lubricant. The decision was made to band the RP internal hemorrhoids, and the [facility] System was used to perform band ligation without complication.  Digital anorectal examination was then performed to assure proper positioning of the band, and to adjust the banded tissue as required.  The patient was discharged home without pain or other issues.  Dietary and behavioral recommendations were given and along with follow-up instructions.     The following adjunctive treatments were recommended:  Call in several weeks with update.  If still symptomatic, return to office for banding of other HR columns    - Amada Jupiter, MD

## 2023-04-01 NOTE — Patient Instructions (Signed)
_______________________________________________________  If your blood pressure at your visit was 140/90 or greater, please contact your primary care physician to follow up on this.  _______________________________________________________  If you are age 70 or older, your body mass index should be between 23-30. Your Body mass index is 24.21 kg/m. If this is out of the aforementioned range listed, please consider follow up with your Primary Care Provider.  If you are age 19 or younger, your body mass index should be between 19-25. Your Body mass index is 24.21 kg/m. If this is out of the aformentioned range listed, please consider follow up with your Primary Care Provider.   ________________________________________________________  The [facility] providers would like to encourage you to use [facility] to communicate with providers for non-urgent requests or questions.  Due to long hold times on the telephone, sending your provider a message by [facility] may be a faster and more efficient way to get a response.  Please allow 48 business hours for a response.  Please remember that this is for non-urgent requests.  _______________________________________________________  [place] [facility] PROCEDURE    FOLLOW-UP CARE   The procedure you have had should have been relatively painless since the banding of the area involved does not have nerve endings and there is no pain sensation.  The rubber band cuts off the blood supply to the hemorrhoid and the band may fall off as soon as 48 hours after the banding (the band may occasionally be seen in the toilet bowl following a bowel movement). You may notice a temporary feeling of fullness in the rectum which should respond adequately to plain Tylenol or Motrin.  Following the banding, avoid strenuous exercise that evening and resume full activity the next day.  A sitz bath (soaking in a warm tub) or bidet is soothing, and can be useful for cleansing the area  after bowel movements.     To avoid constipation, take two tablespoons of natural wheat bran, natural oat bran, flax, Benefiber or any over the counter fiber supplement and increase your water intake to 7-8 glasses daily.    Unless you have been prescribed anorectal medication, do not put anything inside your rectum for two weeks: No suppositories, enemas, fingers, etc.  Occasionally, you may have more bleeding than usual after the banding procedure.  This is often from the untreated hemorrhoids rather than the treated one.  Don't be concerned if there is a tablespoon or so of blood.  If there is more blood than this, lie flat with your bottom higher than your head and apply an ice pack to the area. If the bleeding does not stop within a half an hour or if you feel faint, call our office at [phone] or go to the emergency room.  Problems are not common; however, if there is a substantial amount of bleeding, severe pain, chills, fever or difficulty passing urine (very rare) or other problems, you should call us at [phone] or report to the nearest emergency room.  Do not stay seated continuously for more than 2-3 hours for a day or two after the procedure.  Tighten your buttock muscles 10-15 times every two hours and take 10-15 deep breaths every 1-2 hours.  Do not spend more than a few minutes on the toilet if you cannot empty your bowel; instead re-visit the toilet at a later time.    It was a pleasure to see you today!  Thank you for trusting me with your gastrointestinal care!

## 2023-04-28 ENCOUNTER — Encounter: Payer: Self-pay | Admitting: Family Medicine

## 2023-04-28 DIAGNOSIS — K5792 Diverticulitis of intestine, part unspecified, without perforation or abscess without bleeding: Secondary | ICD-10-CM

## 2023-04-28 DIAGNOSIS — B3731 Acute candidiasis of vulva and vagina: Secondary | ICD-10-CM

## 2023-04-28 NOTE — Telephone Encounter (Signed)
I can see her at 4pm today.  It's a work-in, so may have to wait a bit while here.  - HD

## 2023-04-28 NOTE — Telephone Encounter (Signed)
Called and left patient a detailed vm offering her an appt this afternoon at 4 pm with Dr. Myrtie Neither, I asked that patient call back or send MyChart message to confirm wether or not she will be able to make the appt.

## 2023-04-29 MED ORDER — FLUCONAZOLE 150 MG PO TABS: 150.0000 mg | ORAL_TABLET | Freq: Once | ORAL | 0 refills | Status: AC

## 2023-05-10 ENCOUNTER — Encounter (INDEPENDENT_AMBULATORY_CARE_PROVIDER_SITE_OTHER): Payer: Medicare Other | Admitting: Family Medicine

## 2023-05-10 DIAGNOSIS — R300 Dysuria: Secondary | ICD-10-CM

## 2023-05-10 DIAGNOSIS — E785 Hyperlipidemia, unspecified: Secondary | ICD-10-CM

## 2023-05-11 DIAGNOSIS — R300 Dysuria: Secondary | ICD-10-CM | POA: Diagnosis not present

## 2023-05-11 DIAGNOSIS — E785 Hyperlipidemia, unspecified: Secondary | ICD-10-CM

## 2023-05-11 MED ORDER — ROSUVASTATIN CALCIUM 10 MG PO TABS
10.0000 mg | ORAL_TABLET | Freq: Every day | ORAL | 3 refills | Status: DC
Start: 2023-05-11 — End: 2023-06-02

## 2023-05-11 NOTE — Addendum Note (Signed)
Addended by: Abbe Amsterdam C on: 05/11/2023 01:46 PM   Modules accepted: Orders

## 2023-05-14 NOTE — Addendum Note (Signed)
Addended by: Abbe Amsterdam C on: 05/14/2023 08:39 AM   Modules accepted: Orders

## 2023-05-14 NOTE — Telephone Encounter (Signed)
Please see the MyChart message reply(ies) for my assessment and plan.  The patient gave consent for this Medical Advice Message and is aware that it may result in a bill to their insurance company as well as the possibility that this may result in a co-payment or deductible. They are an established patient, but are not seeking medical advice exclusively about a problem treated during an in person or video visit in the last 7 days. I did not recommend an in person or video visit within 7 days of my reply.  I spent a total of 10 minutes cumulative time within 7 days through MyChart messaging Kha Hari, MD  

## 2023-05-15 ENCOUNTER — Encounter: Payer: Self-pay | Admitting: Family Medicine

## 2023-05-15 ENCOUNTER — Other Ambulatory Visit (INDEPENDENT_AMBULATORY_CARE_PROVIDER_SITE_OTHER): Payer: Medicare Other

## 2023-05-15 DIAGNOSIS — R300 Dysuria: Secondary | ICD-10-CM

## 2023-05-15 DIAGNOSIS — E782 Mixed hyperlipidemia: Secondary | ICD-10-CM

## 2023-05-15 DIAGNOSIS — R5383 Other fatigue: Secondary | ICD-10-CM

## 2023-05-15 LAB — LIPID PANEL
Cholesterol: 292 mg/dL — ABNORMAL HIGH (ref 0–200)
HDL: 75.1 mg/dL (ref >39.00)
LDL Cholesterol: 196 mg/dL — ABNORMAL HIGH (ref 0–99)
NonHDL: 217.21
Total CHOL/HDL Ratio: 4
Triglycerides: 105 mg/dL (ref 0.0–149.0)
VLDL: 21 mg/dL (ref 0.0–40.0)

## 2023-05-15 LAB — TSH: TSH: 1.43 u[IU]/mL (ref 0.35–5.50)

## 2023-05-15 LAB — POCT URINALYSIS DIP (MANUAL ENTRY)
Blood, UA: NEGATIVE
Glucose, UA: NEGATIVE mg/dL
Nitrite, UA: NEGATIVE
Spec Grav, UA: 1.01 (ref 1.010–1.025)
Urobilinogen, UA: 0.2 E.U./dL
pH, UA: 6 (ref 5.0–8.0)

## 2023-05-15 LAB — T3, FREE: T3, Free: 3.1 pg/mL (ref 2.3–4.2)

## 2023-05-15 MED ORDER — CEPHALEXIN 500 MG PO CAPS
500.0000 mg | ORAL_CAPSULE | Freq: Two times a day (BID) | ORAL | 0 refills | Status: DC
Start: 2023-05-15 — End: 2023-09-07

## 2023-05-15 NOTE — Addendum Note (Signed)
Addended by: Abbe Amsterdam C on: 05/15/2023 01:48 PM   Modules accepted: Orders

## 2023-05-15 NOTE — Telephone Encounter (Signed)
Received labs, message to patient

## 2023-05-17 LAB — URINE CULTURE
MICRO NUMBER:: 15084346
SPECIMEN QUALITY:: ADEQUATE

## 2023-05-17 NOTE — Telephone Encounter (Signed)
Received urine culture- + for pseudomonas  She is on keflex Results for orders placed or performed in visit on 05/15/23  Urine Culture   Specimen: Urine  Result Value Ref Range   MICRO NUMBER: [id number]    SPECIMEN QUALITY: Adequate    Sample Source NOT GIVEN    STATUS: FINAL    ISOLATE 1: Pseudomonas aeruginosa (A)       Susceptibility   Pseudomonas aeruginosa - URINE CULTURE, REFLEX    CEFTAZIDIME <=1 Sensitive     CEFEPIME <=1 Sensitive     CIPROFLOXACIN <=0.25 Sensitive     LEVOFLOXACIN 0.5 Sensitive     GENTAMICIN <=1 Sensitive     IMIPENEM 1 Sensitive     PIP/TAZO <=4 Sensitive     TOBRAMYCIN* <=1 Sensitive      * Legend: S = Susceptible  I = Intermediate R = Resistant  NS = Not susceptible * = Not tested  NR = Not reported **NN = See antimicrobic comments   T3, free  Result Value Ref Range   T3, Free 3.1 2.3 - 4.2 pg/mL  TSH  Result Value Ref Range   TSH 1.43 0.35 - 5.50 uIU/mL  Lipid panel  Result Value Ref Range   Cholesterol 292 (H) 0 - 200 mg/dL   Triglycerides 540.9 0.0 - 149.0 mg/dL   HDL 81.19 >14.78 mg/dL   VLDL 29.5 0.0 - 62.1 mg/dL   LDL Cholesterol 308 (H) 0 - 99 mg/dL   Total CHOL/HDL Ratio 4    NonHDL 217.21   POCT urinalysis dipstick  Result Value Ref Range   Color, UA yellow yellow   Clarity, UA clear clear   Glucose, UA negative negative mg/dL   Bilirubin, UA small (A) negative   Ketones, POC UA trace (5) (A) negative mg/dL   Spec Grav, UA 6.578 4.696 - 1.025   Blood, UA negative negative   pH, UA 6.0 5.0 - 8.0   Protein Ur, POC trace (A) negative mg/dL   Urobilinogen, UA 0.2 0.2 or 1.0 E.U./dL   Nitrite, UA Negative Negative   Leukocytes, UA Small (1+) (A) Negative

## 2023-05-18 MED ORDER — FLUCONAZOLE 150 MG PO TABS: 150.0000 mg | ORAL_TABLET | Freq: Once | ORAL | 0 refills | Status: AC

## 2023-05-18 NOTE — Addendum Note (Signed)
Addended by: Abbe Amsterdam C on: 05/18/2023 09:44 AM   Modules accepted: Orders

## 2023-05-28 ENCOUNTER — Encounter: Payer: Self-pay | Admitting: Family Medicine

## 2023-05-28 NOTE — Telephone Encounter (Signed)
Called patient to make sure she is okay.  She notes she is stable, does not think she needs emergency care.  She will stop using the Crestor and let me know how she is the first of the week.  If not doing okay in the meantime she will seek care

## 2023-06-02 NOTE — Addendum Note (Signed)
Addended by: Abbe Amsterdam C on: 06/02/2023 12:21 PM   Modules accepted: Orders

## 2023-07-25 ENCOUNTER — Encounter (INDEPENDENT_AMBULATORY_CARE_PROVIDER_SITE_OTHER): Payer: Medicare Other | Admitting: Family Medicine

## 2023-07-25 DIAGNOSIS — U071 COVID-19: Secondary | ICD-10-CM

## 2023-07-26 NOTE — Telephone Encounter (Signed)

## 2023-07-27 MED ORDER — NIRMATRELVIR/RITONAVIR (PAXLOVID)TABLET
3.0000 | ORAL_TABLET | Freq: Two times a day (BID) | ORAL | 0 refills | Status: AC
Start: 2023-07-27 — End: 2023-08-01

## 2023-07-27 NOTE — Addendum Note (Signed)
Addended by: Abbe Amsterdam C on: 07/27/2023 12:28 PM   Modules accepted: Orders

## 2023-08-22 ENCOUNTER — Other Ambulatory Visit: Payer: Self-pay | Admitting: Family Medicine

## 2023-08-24 MED ORDER — VALACYCLOVIR HCL 1 G PO TABS: ORAL_TABLET | ORAL | 0 refills | Status: DC

## 2023-09-06 ENCOUNTER — Encounter: Payer: Self-pay | Admitting: Family Medicine

## 2023-09-06 DIAGNOSIS — R300 Dysuria: Secondary | ICD-10-CM

## 2023-09-06 DIAGNOSIS — R350 Frequency of micturition: Secondary | ICD-10-CM

## 2023-09-07 ENCOUNTER — Other Ambulatory Visit (INDEPENDENT_AMBULATORY_CARE_PROVIDER_SITE_OTHER): Payer: Medicare Other

## 2023-09-07 DIAGNOSIS — R350 Frequency of micturition: Secondary | ICD-10-CM | POA: Diagnosis not present

## 2023-09-07 LAB — POCT URINALYSIS DIP (MANUAL ENTRY)
Bilirubin, UA: NEGATIVE
Blood, UA: NEGATIVE
Glucose, UA: NEGATIVE mg/dL
Ketones, POC UA: NEGATIVE mg/dL
Nitrite, UA: NEGATIVE
Protein Ur, POC: NEGATIVE mg/dL
Spec Grav, UA: <=1.005 — AB (ref 1.010–1.025)
Urobilinogen, UA: 0.2 U/dL
pH, UA: 5 (ref 5.0–8.0)

## 2023-09-07 LAB — URINALYSIS, MICROSCOPIC ONLY: RBC / HPF: NONE SEEN (ref 0–?)

## 2023-09-07 MED ORDER — CEPHALEXIN 500 MG PO CAPS
500.0000 mg | ORAL_CAPSULE | Freq: Two times a day (BID) | ORAL | 0 refills | Status: DC
Start: 2023-09-07 — End: 2023-10-06

## 2023-09-07 NOTE — Addendum Note (Signed)
Addended by: Abbe Amsterdam C on: 09/07/2023 12:27 PM   Modules accepted: Orders

## 2023-09-07 NOTE — Telephone Encounter (Signed)

## 2023-09-08 LAB — URINE CULTURE
MICRO NUMBER:: 15560536
SPECIMEN QUALITY:: ADEQUATE

## 2023-09-13 ENCOUNTER — Encounter: Payer: Self-pay | Admitting: Family Medicine

## 2023-09-14 ENCOUNTER — Ambulatory Visit (INDEPENDENT_AMBULATORY_CARE_PROVIDER_SITE_OTHER): Payer: Medicare Other | Admitting: Family Medicine

## 2023-09-14 ENCOUNTER — Encounter: Payer: Self-pay | Admitting: Family Medicine

## 2023-09-14 VITALS — BP 116/55 | HR 92 | Temp 97.4°F | Resp 18 | Ht 62.0 in | Wt 131.8 lb

## 2023-09-14 DIAGNOSIS — M25561 Pain in right knee: Secondary | ICD-10-CM

## 2023-09-14 NOTE — Progress Notes (Signed)
Acute Office Visit  Subjective:     Patient ID: Danielle Fitzpatrick, female    DOB: 01/23/1951, 72 y.o.   MRN: [id number]  Chief Complaint  Patient presents with   Joint Swelling    Right knee swelling for 10 days. Felt knee shift yesterday morning    HPI Patient is in today for knee pain.   Discussed the use of AI scribe software for clinical note transcription with the patient, who gave verbal consent to proceed.  History of Present Illness   The patient presents with a 10-day history of posterior knee pain and generalized swelling. The discomfort was severe enough to cause limping and disrupt sleep. The patient reports a sensation of instability in the knee and a shifting sensation accompanied by pain and swelling. The swelling was noted to change location and the pain varied in intensity daily. The patient has been managing the pain with rest, ice, and elevation, which has provided some relief.  The patient has been unable to maintain their usual level of activity, including water aerobics and Pilates, due to the knee pain. The patient also reports difficulty with stairs, experiencing more pain while descending. The patient denies any recent trauma or injury to the knee.  The patient has a history of intermittent swelling in the same area, but this episode has been more prolonged and severe. 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 The patient has been self-managing with over-the-counter anti-inflammatories, heat, and ice.           ROS All review of systems negative except what is listed in the HPI      Objective:    BP (!) 116/55 (BP Location: Right Arm, Patient Position: Sitting, Cuff Size: Normal)   Pulse 92   Temp (!) 97.4 F (36.3 C) (Oral)   Resp 18   Ht 5\' 2"  (1.575 m)   Wt 131 lb 12.8 oz (59.8 kg)   SpO2 99%   BMI 24.11 kg/m    Physical Exam Vitals  reviewed.  Constitutional:      Appearance: Normal appearance.  Musculoskeletal:        General: No swelling or tenderness. Normal range of motion.     Comments: Normal right knee ROM. No pain with anterior/posterior, full flexion, valgus/varus; mild anterior effusion palpable  Skin:    General: Skin is warm and dry.     Findings: No bruising, erythema or lesion.  Neurological:     Mental Status: She is alert and oriented to person, place, and time.        No results found for any visits on 09/14/23.      Assessment & Plan:   Problem List Items Addressed This Visit   None Visit Diagnoses     Acute pain of right knee    -  Primary   Relevant Orders   AMB referral to orthopedics         Knee Pain Pain and swelling in the posterior knee for 10 days with a history of intermittent swelling. Pain is worse with bending and walking. No history of trauma. Physical examination reveals effusion in the knee. Possible differential diagnoses include arthritis, ligament strain, or Baker's cyst. -Refer to orthopedic specialist for further evaluation and imaging. [facility], Dr. Ranell Patrick. -Continue rest, ice, and over-the-counter anti-inflammatories as tolerated. -Provide patient with knee exercises for home therapy. -Follow-up with orthopedic specialist's recommendations.  No orders of the defined types were placed in this encounter.   Return if symptoms worsen or fail to improve.  Clayborne Dana, NP

## 2023-09-16 ENCOUNTER — Ambulatory Visit: Payer: PRIVATE HEALTH INSURANCE | Admitting: Family Medicine

## 2023-10-06 ENCOUNTER — Encounter (HOSPITAL_BASED_OUTPATIENT_CLINIC_OR_DEPARTMENT_OTHER): Payer: Self-pay | Admitting: Urology

## 2023-10-06 ENCOUNTER — Emergency Department (HOSPITAL_BASED_OUTPATIENT_CLINIC_OR_DEPARTMENT_OTHER): Payer: Medicare Other

## 2023-10-06 ENCOUNTER — Inpatient Hospital Stay (HOSPITAL_BASED_OUTPATIENT_CLINIC_OR_DEPARTMENT_OTHER)
Admission: EM | Admit: 2023-10-06 | Discharge: 2023-10-09 | DRG: 330 | Disposition: A | Discharge status: Home Health | Payer: Medicare Other | Attending: Family Medicine | Admitting: Family Medicine

## 2023-10-06 ENCOUNTER — Emergency Department (HOSPITAL_COMMUNITY): Payer: Medicare Other | Admitting: Certified Registered Nurse Anesthetist

## 2023-10-06 ENCOUNTER — Emergency Department (EMERGENCY_DEPARTMENT_HOSPITAL): Payer: Medicare Other | Admitting: Certified Registered Nurse Anesthetist

## 2023-10-06 ENCOUNTER — Encounter (HOSPITAL_COMMUNITY)
Admission: EM | Disposition: A | Discharge status: Home Health | Payer: Self-pay | Source: Home / Self Care | Attending: Family Medicine

## 2023-10-06 DIAGNOSIS — Z79899 Other long term (current) drug therapy: Secondary | ICD-10-CM | POA: Diagnosis not present

## 2023-10-06 DIAGNOSIS — M25561 Pain in right knee: Secondary | ICD-10-CM | POA: Diagnosis present

## 2023-10-06 DIAGNOSIS — Z9889 Other specified postprocedural states: Secondary | ICD-10-CM

## 2023-10-06 DIAGNOSIS — Z818 Family history of other mental and behavioral disorders: Secondary | ICD-10-CM

## 2023-10-06 DIAGNOSIS — Z7982 Long term (current) use of aspirin: Secondary | ICD-10-CM | POA: Diagnosis not present

## 2023-10-06 DIAGNOSIS — Z811 Family history of alcohol abuse and dependence: Secondary | ICD-10-CM | POA: Diagnosis not present

## 2023-10-06 DIAGNOSIS — Z822 Family history of deafness and hearing loss: Secondary | ICD-10-CM

## 2023-10-06 DIAGNOSIS — Z83438 Family history of other disorder of lipoprotein metabolism and other lipidemia: Secondary | ICD-10-CM | POA: Diagnosis not present

## 2023-10-06 DIAGNOSIS — I700 Atherosclerosis of aorta: Secondary | ICD-10-CM | POA: Diagnosis present

## 2023-10-06 DIAGNOSIS — Z833 Family history of diabetes mellitus: Secondary | ICD-10-CM

## 2023-10-06 DIAGNOSIS — Z8261 Family history of arthritis: Secondary | ICD-10-CM | POA: Diagnosis not present

## 2023-10-06 DIAGNOSIS — K562 Volvulus: Secondary | ICD-10-CM

## 2023-10-06 DIAGNOSIS — F32A Depression, unspecified: Secondary | ICD-10-CM | POA: Diagnosis not present

## 2023-10-06 DIAGNOSIS — R600 Localized edema: Secondary | ICD-10-CM | POA: Diagnosis not present

## 2023-10-06 DIAGNOSIS — Z888 Allergy status to other drugs, medicaments and biological substances status: Secondary | ICD-10-CM

## 2023-10-06 DIAGNOSIS — E785 Hyperlipidemia, unspecified: Secondary | ICD-10-CM | POA: Diagnosis present

## 2023-10-06 DIAGNOSIS — F419 Anxiety disorder, unspecified: Secondary | ICD-10-CM | POA: Diagnosis present

## 2023-10-06 DIAGNOSIS — E871 Hypo-osmolality and hyponatremia: Secondary | ICD-10-CM | POA: Diagnosis not present

## 2023-10-06 DIAGNOSIS — R1031 Right lower quadrant pain: Secondary | ICD-10-CM

## 2023-10-06 DIAGNOSIS — Z8249 Family history of ischemic heart disease and other diseases of the circulatory system: Secondary | ICD-10-CM

## 2023-10-06 HISTORY — PX: LAPAROTOMY: SHX154

## 2023-10-06 LAB — COMPREHENSIVE METABOLIC PANEL
ALT: 28 U/L (ref 0–44)
AST: 23 U/L (ref 15–41)
Albumin: 4 g/dL (ref 3.5–5.0)
Alkaline Phosphatase: 78 U/L (ref 38–126)
Anion gap: 11 (ref 5–15)
BUN: 13 mg/dL (ref 8–23)
CO2: 26 mmol/L (ref 22–32)
Calcium: 9.1 mg/dL (ref 8.9–10.3)
Chloride: 94 mmol/L — ABNORMAL LOW (ref 98–111)
Creatinine, Ser: 0.79 mg/dL (ref 0.44–1.00)
GFR, Estimated: >60 mL/min (ref >60)
Glucose, Bld: 123 mg/dL — ABNORMAL HIGH (ref 70–99)
Potassium: 3.7 mmol/L (ref 3.5–5.1)
Sodium: 131 mmol/L — ABNORMAL LOW (ref 135–145)
Total Bilirubin: 0.7 mg/dL (ref <1.2)
Total Protein: 6.6 g/dL (ref 6.5–8.1)

## 2023-10-06 LAB — CBC
HCT: 36.7 % (ref 36.0–46.0)
Hemoglobin: 12.4 g/dL (ref 12.0–15.0)
MCH: 31.6 pg (ref 26.0–34.0)
MCHC: 33.8 g/dL (ref 30.0–36.0)
MCV: 93.6 fL (ref 80.0–100.0)
Platelets: 447 10*3/uL — ABNORMAL HIGH (ref 150–400)
RBC: 3.92 MIL/uL (ref 3.87–5.11)
RDW: 13.1 % (ref 11.5–15.5)
WBC: 11.5 10*3/uL — ABNORMAL HIGH (ref 4.0–10.5)
nRBC: 0 % (ref 0.0–0.2)

## 2023-10-06 LAB — LACTIC ACID, PLASMA
Lactic Acid, Venous: 2.1 mmol/L (ref 0.5–1.9)
Lactic Acid, Venous: 2.4 mmol/L (ref 0.5–1.9)

## 2023-10-06 LAB — URINALYSIS, ROUTINE W REFLEX MICROSCOPIC
Bilirubin Urine: NEGATIVE
Glucose, UA: NEGATIVE mg/dL
Hgb urine dipstick: NEGATIVE
Ketones, ur: NEGATIVE mg/dL
Leukocytes,Ua: NEGATIVE
Nitrite: NEGATIVE
Protein, ur: NEGATIVE mg/dL
Specific Gravity, Urine: 1.015 (ref 1.005–1.030)
pH: 7.5 (ref 5.0–8.0)

## 2023-10-06 LAB — LIPASE, BLOOD: Lipase: 48 U/L (ref 11–51)

## 2023-10-06 SURGERY — LAPAROTOMY, EXPLORATORY
Anesthesia: General | Site: Abdomen

## 2023-10-06 MED ORDER — SODIUM CHLORIDE 0.9 % IV SOLN: INTRAVENOUS | Status: DC | PRN

## 2023-10-06 MED ORDER — PHENYLEPHRINE 80 MCG/ML (10ML) SYRINGE FOR IV PUSH (FOR BLOOD PRESSURE SUPPORT)
PREFILLED_SYRINGE | INTRAVENOUS | Status: DC | PRN
  Administered 2023-10-06: 80 ug via INTRAVENOUS

## 2023-10-06 MED ORDER — POTASSIUM CHLORIDE IN NACL 20-0.9 MEQ/L-% IV SOLN
INTRAVENOUS | Status: AC
  Filled 2023-10-06 (×3): qty 1000

## 2023-10-06 MED ORDER — SUGAMMADEX SODIUM 200 MG/2ML IV SOLN
INTRAVENOUS | Status: DC | PRN
  Administered 2023-10-06 (×2): 100 mg via INTRAVENOUS

## 2023-10-06 MED ORDER — FENTANYL CITRATE (PF) 250 MCG/5ML IJ SOLN
INTRAMUSCULAR | Status: AC
  Filled 2023-10-06: qty 5

## 2023-10-06 MED ORDER — SODIUM CHLORIDE 0.9 % IV BOLUS
1000.0000 mL | Freq: Once | INTRAVENOUS | Status: AC
  Administered 2023-10-06: 1000 mL via INTRAVENOUS

## 2023-10-06 MED ORDER — PROPOFOL 10 MG/ML IV BOLUS
INTRAVENOUS | Status: AC
  Filled 2023-10-06: qty 20

## 2023-10-06 MED ORDER — OXYCODONE HCL 5 MG PO TABS: 2.5000 mg | ORAL_TABLET | ORAL | Status: DC | PRN

## 2023-10-06 MED ORDER — ONDANSETRON HCL 4 MG/2ML IJ SOLN
4.0000 mg | Freq: Once | INTRAMUSCULAR | Status: AC
  Administered 2023-10-06: 4 mg via INTRAVENOUS
  Filled 2023-10-06: qty 2

## 2023-10-06 MED ORDER — SUCCINYLCHOLINE CHLORIDE 200 MG/10ML IV SOSY
PREFILLED_SYRINGE | INTRAVENOUS | Status: DC | PRN
  Administered 2023-10-06: 80 mg via INTRAVENOUS

## 2023-10-06 MED ORDER — IOHEXOL 300 MG/ML  SOLN
100.0000 mL | Freq: Once | INTRAMUSCULAR | Status: AC | PRN
  Administered 2023-10-06: 100 mL via INTRAVENOUS

## 2023-10-06 MED ORDER — KETOROLAC TROMETHAMINE 15 MG/ML IJ SOLN
15.0000 mg | Freq: Four times a day (QID) | INTRAMUSCULAR | Status: DC
  Administered 2023-10-07 – 2023-10-08 (×6): 15 mg via INTRAVENOUS
  Filled 2023-10-06 (×7): qty 1

## 2023-10-06 MED ORDER — ACETAMINOPHEN 10 MG/ML IV SOLN
INTRAVENOUS | Status: AC
Start: 2023-10-06 — End: ?
  Filled 2023-10-06: qty 100

## 2023-10-06 MED ORDER — ONDANSETRON HCL 4 MG/2ML IJ SOLN
INTRAMUSCULAR | Status: DC | PRN
  Administered 2023-10-06: 4 mg via INTRAVENOUS

## 2023-10-06 MED ORDER — MORPHINE SULFATE (PF) 2 MG/ML IV SOLN: 1.0000 mg | INTRAVENOUS | Status: DC | PRN

## 2023-10-06 MED ORDER — PROPOFOL 10 MG/ML IV BOLUS
INTRAVENOUS | Status: DC | PRN
  Administered 2023-10-06: 140 mg via INTRAVENOUS

## 2023-10-06 MED ORDER — HYDROMORPHONE HCL 1 MG/ML IJ SOLN: 1.0000 mg | INTRAMUSCULAR | Status: DC | PRN

## 2023-10-06 MED ORDER — ACETAMINOPHEN 10 MG/ML IV SOLN
1000.0000 mg | Freq: Four times a day (QID) | INTRAVENOUS | Status: AC
  Administered 2023-10-07 (×3): 1000 mg via INTRAVENOUS
  Filled 2023-10-06 (×3): qty 100

## 2023-10-06 MED ORDER — MIDAZOLAM HCL 2 MG/2ML IJ SOLN
INTRAMUSCULAR | Status: AC
  Filled 2023-10-06: qty 2

## 2023-10-06 MED ORDER — 0.9 % SODIUM CHLORIDE (POUR BTL) OPTIME
TOPICAL | Status: DC | PRN
  Administered 2023-10-06: 3000 mL

## 2023-10-06 MED ORDER — FENTANYL CITRATE (PF) 250 MCG/5ML IJ SOLN
INTRAMUSCULAR | Status: DC | PRN
  Administered 2023-10-06 (×2): 50 ug via INTRAVENOUS

## 2023-10-06 MED ORDER — ONDANSETRON HCL 4 MG/2ML IJ SOLN
4.0000 mg | Freq: Four times a day (QID) | INTRAMUSCULAR | Status: DC | PRN
  Administered 2023-10-07: 4 mg via INTRAVENOUS
  Filled 2023-10-06 (×2): qty 2

## 2023-10-06 MED ORDER — CEFAZOLIN SODIUM 1 G IJ SOLR
INTRAMUSCULAR | Status: AC
  Filled 2023-10-06: qty 20

## 2023-10-06 MED ORDER — METHOCARBAMOL 1000 MG/10ML IJ SOLN
1000.0000 mg | Freq: Three times a day (TID) | INTRAMUSCULAR | Status: DC
  Administered 2023-10-07: 1000 mg via INTRAVENOUS
  Filled 2023-10-06 (×2): qty 10

## 2023-10-06 MED ORDER — LIDOCAINE 2% (20 MG/ML) 5 ML SYRINGE
INTRAMUSCULAR | Status: DC | PRN
  Administered 2023-10-06: 60 mg via INTRAVENOUS

## 2023-10-06 MED ORDER — MORPHINE SULFATE (PF) 4 MG/ML IV SOLN
4.0000 mg | Freq: Once | INTRAVENOUS | Status: AC
  Administered 2023-10-06: 4 mg via INTRAVENOUS
  Filled 2023-10-06: qty 1

## 2023-10-06 MED ORDER — ACETAMINOPHEN 10 MG/ML IV SOLN
INTRAVENOUS | Status: DC | PRN
  Administered 2023-10-06: 1000 mg via INTRAVENOUS

## 2023-10-06 MED ORDER — DEXAMETHASONE SODIUM PHOSPHATE 10 MG/ML IJ SOLN
INTRAMUSCULAR | Status: DC | PRN
  Administered 2023-10-06: 5 mg via INTRAVENOUS

## 2023-10-06 MED ORDER — ROCURONIUM BROMIDE 10 MG/ML (PF) SYRINGE
PREFILLED_SYRINGE | INTRAVENOUS | Status: DC | PRN
  Administered 2023-10-06: 30 mg via INTRAVENOUS

## 2023-10-06 MED ORDER — ONDANSETRON HCL 4 MG/2ML IJ SOLN: 4.0000 mg | Freq: Once | INTRAMUSCULAR | Status: DC | PRN

## 2023-10-06 MED ORDER — FENTANYL CITRATE (PF) 100 MCG/2ML IJ SOLN
25.0000 ug | INTRAMUSCULAR | Status: DC | PRN
  Administered 2023-10-07 (×4): 25 ug via INTRAVENOUS
  Administered 2023-10-07: 50 ug via INTRAVENOUS

## 2023-10-06 MED ORDER — HYDROMORPHONE HCL 1 MG/ML IJ SOLN: 0.5000 mg | INTRAMUSCULAR | Status: DC | PRN

## 2023-10-06 MED ORDER — CEFAZOLIN SODIUM-DEXTROSE 2-3 GM-%(50ML) IV SOLR
INTRAVENOUS | Status: DC | PRN
  Administered 2023-10-06: 2 g via INTRAVENOUS

## 2023-10-06 SURGICAL SUPPLY — 41 items
APL PRP STRL LF DISP 70% ISPRP (MISCELLANEOUS) ×1
BLADE CLIPPER SURG (BLADE) IMPLANT
CANISTER SUCT 3000ML PPV (MISCELLANEOUS) ×1 IMPLANT
CHLORAPREP W/TINT 26 (MISCELLANEOUS) ×1 IMPLANT
COVER SURGICAL LIGHT HANDLE (MISCELLANEOUS) ×1 IMPLANT
DRAPE LAPAROSCOPIC ABDOMINAL (DRAPES) ×1 IMPLANT
DRAPE UNIVERSAL (DRAPES) ×1 IMPLANT
DRAPE WARM FLUID 44X44 (DRAPES) ×1 IMPLANT
DRSG OPSITE POSTOP 4X10 (GAUZE/BANDAGES/DRESSINGS) IMPLANT
DRSG OPSITE POSTOP 4X8 (GAUZE/BANDAGES/DRESSINGS) IMPLANT
ELECT BLADE 6.5 EXT (BLADE) IMPLANT
ELECT CAUTERY BLADE 6.4 (BLADE) ×1 IMPLANT
ELECT REM PT RETURN 9FT ADLT (ELECTROSURGICAL) ×1
ELECTRODE REM PT RTRN 9FT ADLT (ELECTROSURGICAL) ×1 IMPLANT
GLOVE BIO SURGEON STRL SZ 6.5 (GLOVE) ×1 IMPLANT
GLOVE BIOGEL PI IND STRL 6 (GLOVE) ×1 IMPLANT
GOWN STRL REUS W/ TWL LRG LVL3 (GOWN DISPOSABLE) ×2 IMPLANT
GOWN STRL REUS W/TWL LRG LVL3 (GOWN DISPOSABLE) ×2
HANDLE SUCTION POOLE (INSTRUMENTS) ×1 IMPLANT
KIT BASIN OR (CUSTOM PROCEDURE TRAY) ×1 IMPLANT
KIT TURNOVER KIT B (KITS) ×1 IMPLANT
LIGASURE IMPACT 36 18CM CVD LR (INSTRUMENTS) IMPLANT
MANIFOLD NEPTUNE II (INSTRUMENTS) IMPLANT
NS IRRIG 1000ML POUR BTL (IV SOLUTION) ×2 IMPLANT
PACK GENERAL/GYN (CUSTOM PROCEDURE TRAY) ×1 IMPLANT
PAD ARMBOARD 7.5X6 YLW CONV (MISCELLANEOUS) ×1 IMPLANT
SPONGE T-LAP 18X18 ~~LOC~~+RFID (SPONGE) IMPLANT
STAPLER VISISTAT (STAPLE) IMPLANT
STAPLER VISISTAT 35W (STAPLE) ×1 IMPLANT
SUCTION POOLE HANDLE (INSTRUMENTS) ×1
SUT PDS AB 1 TP1 54 (SUTURE) IMPLANT
SUT PDS AB 1 TP1 96 (SUTURE) IMPLANT
SUT SILK 2 0 SH CR/8 (SUTURE) ×1 IMPLANT
SUT SILK 2 0 TIES 10X30 (SUTURE) ×1 IMPLANT
SUT SILK 3 0 SH CR/8 (SUTURE) ×1 IMPLANT
SUT SILK 3 0 TIES 10X30 (SUTURE) ×1 IMPLANT
SUT VIC AB 3-0 SH 18 (SUTURE) IMPLANT
TOWEL GREEN STERILE (TOWEL DISPOSABLE) ×1 IMPLANT
TRAY FOLEY MTR SLVR 16FR STAT (SET/KITS/TRAYS/PACK) IMPLANT
TRAY FOLEY W/BAG SLVR 14FR (SET/KITS/TRAYS/PACK) IMPLANT
YANKAUER SUCT BULB TIP NO VENT (SUCTIONS) IMPLANT

## 2023-10-06 NOTE — Consult Note (Signed)
Reason for Consult/Chief Complaint: cecal volvulus Consultant: Tegeler, MD  Danielle Fitzpatrick is an 72 y.o. female.   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 LA initially 2.1 and recheck was 2.4 after resuscitation. Reports prior inability to pas stool or gas, but has been able to pass small amount of gas since arriving at [facility]. Prior BTL via mini-[facility].    Past Medical History:  Diagnosis Date   Allergy    Anxiety    Arthritis    Chicken pox    Depression    Diverticulitis    Diverticulosis    Hyperlipidemia     Past Surgical History:  Procedure Laterality Date   AUGMENTATION MAMMAPLASTY      Family History  Problem Relation Age of Onset   Depression Mother    Hyperlipidemia Mother    Hypertension Mother    Hypertension Father    Hyperlipidemia Father    Heart disease Father    Hearing loss Father    Diabetes Father    Depression Father    Rheum arthritis Father    Depression Sister    Alcohol abuse Brother    Depression Brother    Early death Brother    Heart attack Brother    Hypertension Brother    Hearing loss Maternal Grandmother    Diabetes Maternal Grandfather    Early death Maternal Grandfather    Heart disease Maternal Grandfather    Alcohol abuse Paternal Grandmother    Early death Paternal Grandmother    Hyperlipidemia Daughter    Anxiety disorder Son    Wilson's disease Neg Hx    Colon cancer Neg Hx    Stomach cancer Neg Hx    Esophageal cancer Neg Hx    Rectal cancer Neg Hx     Social History:  reports that she has never smoked. She has never used smokeless tobacco. She reports current alcohol use. She reports that she does not use drugs.  Allergies:  Allergies  Allergen Reactions   Crestor [Rosuvastatin]     Shakes and tremors    Medications: I have reviewed the patient's current medications.  Results for orders placed or performed during the hospital encounter of 10/06/23 (from the past 48 hour(s))   Lipase, blood     Status: None   Collection Time: 10/06/23  3:09 PM  Result Value Ref Range   Lipase 48 11 - 51 U/L    Comment: Performed at [facility], [address] [place], [place] [address]  Comprehensive metabolic panel     Status: Abnormal   Collection Time: 10/06/23  3:09 PM  Result Value Ref Range   Sodium 131 (L) 135 - 145 mmol/L   Potassium 3.7 3.5 - 5.1 mmol/L   Chloride 94 (L) 98 - 111 mmol/L   CO2 26 22 - 32 mmol/L   Glucose, Bld 123 (H) 70 - 99 mg/dL    Comment: Glucose reference range applies only to samples taken after fasting for at least 8 hours.   BUN 13 8 - 23 mg/dL   Creatinine, Ser 0.86 0.44 - 1.00 mg/dL   Calcium 9.1 8.9 - 57.8 mg/dL   Total Protein 6.6 6.5 - 8.1 g/dL   Albumin 4.0 3.5 - 5.0 g/dL   AST 23 15 - 41 U/L   ALT 28 0 - 44 U/L   Alkaline Phosphatase 78 38 - 126 U/L   Total Bilirubin 0.7 <1.2 mg/dL   GFR,  Estimated >60 >60 mL/min    Comment: (NOTE) Calculated using the CKD-EPI Creatinine Equation (2021)    Anion gap 11 5 - 15    Comment: Performed at [facility], [address] [place], [place] [address]  CBC     Status: Abnormal   Collection Time: 10/06/23  3:09 PM  Result Value Ref Range   WBC 11.5 (H) 4.0 - 10.5 K/uL   RBC 3.92 3.87 - 5.11 MIL/uL   Hemoglobin 12.4 12.0 - 15.0 g/dL   HCT 60.4 54.0 - 98.1 %   MCV 93.6 80.0 - 100.0 fL   MCH 31.6 26.0 - 34.0 pg   MCHC 33.8 30.0 - 36.0 g/dL   RDW 19.1 47.8 - 29.5 %   Platelets 447 (H) 150 - 400 K/uL   nRBC 0.0 0.0 - 0.2 %    Comment: Performed at [facility], [address] [place], [place] [address]  Urinalysis, Routine w reflex microscopic -Urine, Clean Catch     Status: None   Collection Time: 10/06/23  3:09 PM  Result Value Ref Range   Color, Urine YELLOW YELLOW   APPearance CLEAR CLEAR   Specific Gravity, Urine 1.015 1.005 - 1.030   pH 7.5 5.0 - 8.0   Glucose, UA NEGATIVE NEGATIVE mg/dL   Hgb urine dipstick NEGATIVE NEGATIVE   Bilirubin  Urine NEGATIVE NEGATIVE   Ketones, ur NEGATIVE NEGATIVE mg/dL   Protein, ur NEGATIVE NEGATIVE mg/dL   Nitrite NEGATIVE NEGATIVE   Leukocytes,Ua NEGATIVE NEGATIVE    Comment: Microscopic not done on urines with negative protein, blood, leukocytes, nitrite, or glucose < 500 mg/dL. Performed at [facility], [address] [place], [place] [address]   Lactic acid, plasma     Status: Abnormal   Collection Time: 10/06/23  3:43 PM  Result Value Ref Range   Lactic Acid, Venous 2.1 (HH) 0.5 - 1.9 mmol/L    Comment: CRITICAL RESULT CALLED TO, READ BACK BY AND VERIFIED WITH A. NOAH RN 10/06/23 1613 MB Performed at [facility], [address] [place], [place] [address]   Lactic acid, plasma     Status: Abnormal   Collection Time: 10/06/23  5:51 PM  Result Value Ref Range   Lactic Acid, Venous 2.4 (HH) 0.5 - 1.9 mmol/L    Comment: CRITICAL RESULT CALLED TO, READ BACK BY AND VERIFIED WITH A. NOAH RN 10/06/23 1820 MB Performed at [facility], [address] [place], [place] [address]     CT ABDOMEN PELVIS W CONTRAST  Result Date: 10/06/2023 CLINICAL DATA:  Right lower quadrant abdominal pain EXAM: CT ABDOMEN AND PELVIS WITH CONTRAST TECHNIQUE: Multidetector CT imaging of the abdomen and pelvis was performed using the standard protocol following bolus administration of intravenous contrast. RADIATION DOSE REDUCTION: This exam was performed according to the departmental dose-optimization program which includes automated exposure control, adjustment of the mA and/or kV according to patient size and/or use of iterative reconstruction technique. CONTRAST:  OMNIPAQUE IOHEXOL 300 MG/ML  SOLN COMPARISON:  CT abdomen and pelvis dated 06/18/2022 FINDINGS: Lower chest: No focal consolidation or pulmonary nodule in the lung bases. No pleural effusion or pneumothorax demonstrated. Partially imaged heart size is normal. Hepatobiliary: No focal hepatic lesions. Mild periportal  edema. No intra or extrahepatic biliary ductal dilation. Cholelithiasis. Pancreas: No focal lesions or main ductal dilation. Spleen: Normal in size without focal abnormality. Adrenals/Urinary Tract: No adrenal nodules. No suspicious  renal mass, calculi or hydronephrosis. No focal bladder wall thickening. Stomach/Bowel: Normal appearance of the stomach. Markedly dilated cecum is inverted into the right upper quadrant with displacement of the hepatic flexure to the right lower quadrant where there is marked luminal narrowing and twisting of the mesentery (602:72). Colonic diverticulosis without acute diverticulitis. Normal appendix. Vascular/Lymphatic: Aortic atherosclerosis. Retroaortic left renal vein. No enlarged abdominal or pelvic lymph nodes. Reproductive: No adnexal masses. Other: Trace free fluid. No free air or fluid collection. Punctate hypodensities along the anterior margin of the liver (301:24) are favored to represent peritoneal fat. Musculoskeletal: No acute or abnormal lytic or blastic osseous lesions. Multilevel degenerative changes of the partially imaged thoracic and lumbar spine. Unchanged grade 1 anterolisthesis at L4-5. IMPRESSION: 1. Markedly dilated cecum is inverted into the right upper quadrant with displacement of the hepatic flexure to the right lower quadrant where there is marked luminal narrowing and twisting of the mesentery, consistent with cecal volvulus. 2. Normal appendix. 3. Cholelithiasis. 4.  Aortic Atherosclerosis (ICD10-I70.0). These results were called by telephone at the time of interpretation on 10/06/2023 at 7:33 pm to provider Lynden Oxford, who verbally acknowledged these results. Electronically Signed   By: Agustin Cree M.D.   On: 10/06/2023 19:35    ROS 10 point review of systems is negative except as listed above in HPI.   Physical Exam Blood pressure 138/79, pulse 88, temperature (!) 97.4 F (36.3 C), temperature source Oral, resp. rate (!) 27, height 5\' 2"   (1.575 m), weight 60 kg, SpO2 100%. Constitutional: well-developed, well-nourished HEENT: pupils equal, round, reactive to light, 2mm b/l, moist conjunctiva, external inspection of ears and nose normal, hearing intact Oropharynx: normal oropharyngeal mucosa, normal dentition Neck: no thyromegaly, trachea midline, no midline cervical tenderness to palpation Chest: breath sounds equal bilaterally, normal respiratory effort, no midline or lateral chest wall tenderness to palpation/deformity Abdomen: soft, R sided TTP, no peritoneal signs, no bruising, no hepatosplenomegaly GU: normal female genitalia  Skin: warm, dry, no rashes Psych: normal memory, normal mood/affect     Assessment/Plan: Cecal volvulus  Cecal volvulus - with rising LA despite resuscitation, will plan operative exploration to ensure no bowel compromise. Informed consent obtained for exlap, possible bowel resection, possible ostomy, any other necessary and indicated procedures. We clearly discussed the possibility of bowel resection in the case of irreversible bowel ischemia and the possibility of ostomy creation and/or open abdomen in the setting of hemodynamic instability. All questions were answered from the patient and her two children at bedside. Patient designates her two children as her medical decision-makers should she require to remain intubated post-op.  FEN - strict NPO DVT - SCDs, hold chemical ppx due to bleeding concerns Dispo -  pending OR findings     Diamantina Monks, MD General and Trauma Surgery [facility]

## 2023-10-06 NOTE — Op Note (Signed)
   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 year old female with cecal volvulus and concern for bowel ischemia     Surgeon: Diamantina Monks, MD  Anesthesiologist: Desmond Lope, MD Anesthesia: General  Findings:  Specimen: None EBL: 10cc Drains/Implants: None  Disposition: PACU - hemodynamically stable.  Description of procedure: The patient was positioned supine on the operating room table. General anesthetic induction and intubation were uneventful. Foley catheter insertion was performed and was atraumatic. Time-out was performed verifying correct patient, procedure, signature of informed consent, and administration of pre-operative antibiotics. The abdomen was prepped and draped in the usual sterile fashion.  A midline incision was made and deepened through the fascia into the peritoneal cavity was entered.  Significantly distended colon was immediately encountered.  The colon appeared to be the pink and viable.  Upon further exploration, cecal volvulus was confirmed.  The colon was detorsed and the bowel run from ligament of Treitz to the distal sigmoid.  All bowel appeared to be viable.  The distended sigmoid contents were decompressed into the small bowel and proximally into the stomach where an NG tube was present for suctioning.  After decompression, the midline fascia was closed with #1 looped PDS suture the skin was closed with staples.  Sterile dressings were applied. All sponge and instrument counts were correct at the conclusion of the procedure. The patient was awakened from anesthesia, extubated uneventfully, and transported to the PACU in good condition. There were no complications.    Diamantina Monks, MD General and Trauma Surgery [facility]

## 2023-10-06 NOTE — ED Provider Triage Note (Signed)
Emergency Medicine Provider Triage Evaluation Note  Danielle Fitzpatrick , a 72 y.o. female  was evaluated in triage.  Pt complains of RLQ pain. Nausea without emesis. Denies fever. History of diverticulitis. .  Review of Systems  Positive: Abdominal pain, nausea Negative: vomiting  Physical Exam  BP (!) 163/98 (BP Location: Left Arm)   Pulse (!) 103   Temp 97.7 F (36.5 C)   Resp 20   Ht 5\' 2"  (1.575 m)   Wt 59.8 kg   SpO2 100%   BMI 24.11 kg/m  Gen:   Awake, appears uncomfortable Resp:  Normal effort  MSK:   Moves extremities without difficulty  Other:  RLQ tenderness  Medical Decision Making  Medically screening exam initiated at 3:16 PM.  Appropriate orders placed.  Danielle Fitzpatrick was informed that the remainder of the evaluation will be completed by another provider, this initial triage assessment does not replace that evaluation, and the importance of remaining in the ED until their evaluation is complete.     Felicie Morn, NP 10/06/23 1525

## 2023-10-06 NOTE — ED Notes (Signed)
Pt states feels like she is going to faint, back in triage for reeval  and repeat VS, pt hypotensive, provider made aware. NS bolus started

## 2023-10-06 NOTE — ED Notes (Signed)
ED TO INPATIENT HANDOFF REPORT  ED Nurse Name and Phone #:  Theadora Rama RN [phone]  S Name/Age/Gender Danielle Fitzpatrick 72 y.o. female Room/Bed: 023C/023C  Code Status   Code Status: Not on file  Home/SNF/Other Home Patient oriented to: self, place, time, and situation Is this baseline? 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]  Triage Note Pt states RLQ pain that started 1 hr ago  Denies fever, denies Vomiting, reports nausea r/t pain     Allergies Allergies  Allergen Reactions   Crestor [Rosuvastatin]     Shakes and tremors    Level of Care/Admitting Diagnosis ED Disposition     ED Disposition  Admit   Condition  --   Comment  Hospital Area: [facility] [[id number]]  Level of Care: Progressive [102]  Admit to Progressive based on following criteria: GI, ENDOCRINE disease patients with GI bleeding, acute liver failure or pancreatitis, stable with diabetic ketoacidosis or thyrotoxicosis (hypothyroid) state.  May admit patient to [facility] or [facility] if equivalent level of care is available:: No  Interfacility transfer: Yes  Covid Evaluation: Asymptomatic - no recent exposure (last 10 days) testing not required  Diagnosis: Volvulus of colon [facility]) [[id number]]  Admitting Physician: Gery Pray [[id number]]  Attending Physician: Gery Pray [[id number]]  Certification:: I certify this patient will need inpatient services for at least 2 midnights  Expected Medical Readiness: 10/08/2023          B Medical/Surgery History Past Medical History:  Diagnosis Date   Allergy    Anxiety    Arthritis    Chicken pox    Depression    Diverticulitis    Diverticulosis    Hyperlipidemia    Past Surgical History:  Procedure Laterality Date   AUGMENTATION MAMMAPLASTY       A IV Location/Drains/Wounds Patient Lines/Drains/Airways Status     Active Line/Drains/Airways     Name Placement date Placement time Site  Days   Peripheral IV 10/06/23 20 G Anterior;Left;Proximal Forearm 10/06/23  1512  Forearm  less than 1            Intake/Output Last 24 hours No intake or output data in the 24 hours ending 10/06/23 2110  Labs/Imaging Results for orders placed or performed during the hospital encounter of 10/06/23 (from the past 48 hour(s))  Lipase, blood     Status: None   Collection Time: 10/06/23  3:09 PM  Result Value Ref Range   Lipase 48 11 - 51 U/L    Comment: Performed at [facility], [address] [place], [place] [address]  Comprehensive metabolic panel     Status: Abnormal   Collection Time: 10/06/23  3:09 PM  Result Value Ref Range   Sodium 131 (L) 135 - 145 mmol/L   Potassium 3.7 3.5 - 5.1 mmol/L   Chloride 94 (L) 98 - 111 mmol/L   CO2 26 22 - 32 mmol/L   Glucose, Bld 123 (H) 70 - 99 mg/dL    Comment: Glucose reference range applies only to samples taken after fasting for at least 8 hours.   BUN 13 8 - 23 mg/dL   Creatinine, Ser 7.32 0.44 - 1.00 mg/dL   Calcium 9.1 8.9 - 20.2 mg/dL   Total Protein 6.6 6.5 - 8.1 g/dL   Albumin 4.0 3.5 - 5.0 g/dL   AST 23 15 - 41 U/L   ALT 28 0 - 44 U/L  Alkaline Phosphatase 78 38 - 126 U/L   Total Bilirubin 0.7 <1.2 mg/dL   GFR, Estimated >82 >95 mL/min    Comment: (NOTE) Calculated using the CKD-EPI Creatinine Equation (2021)    Anion gap 11 5 - 15    Comment: Performed at [facility], [address] [place], [place] [address]  CBC     Status: Abnormal   Collection Time: 10/06/23  3:09 PM  Result Value Ref Range   WBC 11.5 (H) 4.0 - 10.5 K/uL   RBC 3.92 3.87 - 5.11 MIL/uL   Hemoglobin 12.4 12.0 - 15.0 g/dL   HCT 86.5 78.4 - 69.6 %   MCV 93.6 80.0 - 100.0 fL   MCH 31.6 26.0 - 34.0 pg   MCHC 33.8 30.0 - 36.0 g/dL   RDW 29.5 28.4 - 13.2 %   Platelets 447 (H) 150 - 400 K/uL   nRBC 0.0 0.0 - 0.2 %    Comment: Performed at [facility], [address] [place], [place] [address]  Urinalysis,  Routine w reflex microscopic -Urine, Clean Catch     Status: None   Collection Time: 10/06/23  3:09 PM  Result Value Ref Range   Color, Urine YELLOW YELLOW   APPearance CLEAR CLEAR   Specific Gravity, Urine 1.015 1.005 - 1.030   pH 7.5 5.0 - 8.0   Glucose, UA NEGATIVE NEGATIVE mg/dL   Hgb urine dipstick NEGATIVE NEGATIVE   Bilirubin Urine NEGATIVE NEGATIVE   Ketones, ur NEGATIVE NEGATIVE mg/dL   Protein, ur NEGATIVE NEGATIVE mg/dL   Nitrite NEGATIVE NEGATIVE   Leukocytes,Ua NEGATIVE NEGATIVE    Comment: Microscopic not done on urines with negative protein, blood, leukocytes, nitrite, or glucose < 500 mg/dL. Performed at [facility], [address] [place], [place] [address]   Lactic acid, plasma     Status: Abnormal   Collection Time: 10/06/23  3:43 PM  Result Value Ref Range   Lactic Acid, Venous 2.1 (HH) 0.5 - 1.9 mmol/L    Comment: CRITICAL RESULT CALLED TO, READ BACK BY AND VERIFIED WITH A. NOAH RN 10/06/23 1613 MB Performed at [facility], [address] [place], [place] [address]   Lactic acid, plasma     Status: Abnormal   Collection Time: 10/06/23  5:51 PM  Result Value Ref Range   Lactic Acid, Venous 2.4 (HH) 0.5 - 1.9 mmol/L    Comment: CRITICAL RESULT CALLED TO, READ BACK BY AND VERIFIED WITH A. NOAH RN 10/06/23 1820 MB Performed at [facility], [address] [place], [place] [address]    CT ABDOMEN PELVIS W CONTRAST  Result Date: 10/06/2023 CLINICAL DATA:  Right lower quadrant abdominal pain EXAM: CT ABDOMEN AND PELVIS WITH CONTRAST TECHNIQUE: Multidetector CT imaging of the abdomen and pelvis was performed using the standard protocol following bolus administration of intravenous contrast. RADIATION DOSE REDUCTION: This exam was performed according to the departmental dose-optimization program which includes automated exposure control, adjustment of the mA and/or kV according to patient size and/or use of iterative reconstruction  technique. CONTRAST:  OMNIPAQUE IOHEXOL 300 MG/ML  SOLN COMPARISON:  CT abdomen and pelvis dated 06/18/2022 FINDINGS: Lower chest: No focal consolidation or pulmonary nodule in the lung bases. No pleural effusion or pneumothorax demonstrated. Partially imaged heart size is normal. Hepatobiliary: No focal hepatic lesions. Mild periportal edema. No intra or extrahepatic biliary ductal dilation. Cholelithiasis. Pancreas: No focal lesions or main  ductal dilation. Spleen: Normal in size without focal abnormality. Adrenals/Urinary Tract: No adrenal nodules. No suspicious renal mass, calculi or hydronephrosis. No focal bladder wall thickening. Stomach/Bowel: Normal appearance of the stomach. Markedly dilated cecum is inverted into the right upper quadrant with displacement of the hepatic flexure to the right lower quadrant where there is marked luminal narrowing and twisting of the mesentery (602:72). Colonic diverticulosis without acute diverticulitis. Normal appendix. Vascular/Lymphatic: Aortic atherosclerosis. Retroaortic left renal vein. No enlarged abdominal or pelvic lymph nodes. Reproductive: No adnexal masses. Other: Trace free fluid. No free air or fluid collection. Punctate hypodensities along the anterior margin of the liver (301:24) are favored to represent peritoneal fat. Musculoskeletal: No acute or abnormal lytic or blastic osseous lesions. Multilevel degenerative changes of the partially imaged thoracic and lumbar spine. Unchanged grade 1 anterolisthesis at L4-5. IMPRESSION: 1. Markedly dilated cecum is inverted into the right upper quadrant with displacement of the hepatic flexure to the right lower quadrant where there is marked luminal narrowing and twisting of the mesentery, consistent with cecal volvulus. 2. Normal appendix. 3. Cholelithiasis. 4.  Aortic Atherosclerosis (ICD10-I70.0). These results were called by telephone at the time of interpretation on 10/06/2023 at 7:33 pm to provider  Lynden Oxford, who verbally acknowledged these results. Electronically Signed   By: Agustin Cree M.D.   On: 10/06/2023 19:35    Pending Labs Unresulted Labs (From admission, onward)    None       Vitals/Pain Today's Vitals   10/06/23 1930 10/06/23 1934 10/06/23 2000 10/06/23 2109  BP: 124/78  (!) 144/80   Pulse:   88   Resp: 17  (!) 24   Temp:  97.9 F (36.6 C)    TempSrc:  Oral    SpO2:   98%   Weight:    60 kg  Height:    5\' 2"  (1.575 m)  PainSc:        Isolation Precautions No active isolations  Medications Medications  HYDROmorphone (DILAUDID) injection 0.5 mg (has no administration in time range)  HYDROmorphone (DILAUDID) injection 1 mg (has no administration in time range)  ondansetron (ZOFRAN) injection 4 mg (has no administration in time range)  0.9 % NaCl with KCl 20 mEq/ L  infusion (has no administration in time range)  sodium chloride 0.9 % bolus 1,000 mL (has no administration in time range)  0.9 % irrigation (POUR BTL) (3,000 mLs Irrigation Given 10/06/23 2033)  morphine (PF) 4 MG/ML injection 4 mg (4 mg Intravenous Given 10/06/23 1515)  ondansetron (ZOFRAN) injection 4 mg (4 mg Intravenous Given 10/06/23 1515)  sodium chloride 0.9 % bolus 1,000 mL (0 mLs Intravenous Stopped 10/06/23 1751)  iohexol (OMNIPAQUE) 300 MG/ML solution 100 mL (100 mLs Intravenous Contrast Given 10/06/23 1647)  sodium chloride 0.9 % bolus 1,000 mL (1,000 mLs Intravenous New Bag/Given 10/06/23 1933)    Mobility walks     Focused Assessments Cardiac Assessment Handoff:    No results found for: "CKTOTAL", "CKMB", "CKMBINDEX", "TROPONINI" No results found for: "DDIMER" Does the Patient currently have chest pain? No   , Pulmonary Assessment Handoff:  Lung sounds:   O2 Device: Room Air      R Recommendations: See Admitting Provider Note  Report given to:   Additional Notes:

## 2023-10-06 NOTE — ED Notes (Signed)
Called [facility] for transport to ED to [facility] , Doctor is waiting to hear back from the hospitalist No Current ETA of when the truck will be en route to get the patient. Doctor at [facility] has accepted the patient and are waiting for them. Called 2 19:55

## 2023-10-06 NOTE — Anesthesia Preprocedure Evaluation (Addendum)
Anesthesia Evaluation  Patient identified by MRN, date of birth, ID band Patient awake    Reviewed: Allergy & Precautions, NPO status , Patient's Chart, lab work & pertinent test results  Airway Mallampati: II  TM Distance: >3 FB Neck ROM: Full    Dental  (+) Teeth Intact, Dental Advisory Given, Caps   Pulmonary neg pulmonary ROS   Pulmonary exam normal breath sounds clear to auscultation       Cardiovascular negative cardio ROS Normal cardiovascular exam Rhythm:Regular Rate:Normal     Neuro/Psych  PSYCHIATRIC DISORDERS Anxiety Depression    negative neurological ROS     GI/Hepatic Neg liver ROS,,,cecal volvulus   Endo/Other  negative endocrine ROS    Renal/GU negative Renal ROS     Musculoskeletal  (+) Arthritis , Osteoarthritis,    Abdominal   Peds  Hematology negative hematology ROS (+)   Anesthesia Other Findings   Reproductive/Obstetrics                             Anesthesia Physical Anesthesia Plan  ASA: 2 and emergent  Anesthesia Plan: General   Post-op Pain Management: Ofirmev IV (intra-op)* and Toradol IV (intra-op)*   Induction: Intravenous, Rapid sequence and Cricoid pressure planned  PONV Risk Score and Plan: 3 and Dexamethasone and Ondansetron  Airway Management Planned: Oral ETT  Additional Equipment: Arterial line  Intra-op Plan:   Post-operative Plan: Possible Post-op intubation/ventilation  Informed Consent: I have reviewed the patients History and Physical, chart, labs and discussed the procedure including the risks, benefits and alternatives for the proposed anesthesia with the patient or authorized representative who has indicated his/her understanding and acceptance.     Dental advisory given  Plan Discussed with: CRNA  Anesthesia Plan Comments:        Anesthesia Quick Evaluation

## 2023-10-06 NOTE — ED Provider Notes (Signed)
[place] [facility] AT [facility] Provider Note   CSN: [id number] Arrival date & time: 10/06/23  1451     History  Chief Complaint  Patient presents with   Abdominal Pain    Danielle Fitzpatrick is a 72 y.o. female.  The history is provided by the patient and medical records. No language interpreter was used.  Abdominal Pain Pain location:  RLQ and RUQ Pain quality: aching and sharp   Pain radiates to:  Does not radiate Pain severity:  Severe Onset quality:  Gradual Duration:  2 days Timing:  Constant Progression:  Worsening Context: not trauma   Relieved by:  Nothing Worsened by:  Palpation and eating Ineffective treatments:  None tried Associated symptoms: constipation, fatigue and nausea   Associated symptoms: no chest pain, no chills, no cough, no diarrhea, no dysuria, no fever, no flatus, no shortness of breath and no vomiting        Home Medications Prior to Admission medications   Medication Sig Start Date End Date Taking? Authorizing Provider  acetaminophen (TYLENOL) 500 MG tablet Take 500 mg by mouth 3 (three) times daily as needed for moderate pain (lower back pain).    [provider]  aspirin EC 81 MG tablet Take 81 mg by mouth. 10/20/12   [provider]  B Complex Vitamins (VITAMIN-B COMPLEX PO) Take by mouth.    [provider]  Biotin 86578 MCG TABS Take by mouth.    [provider]  buPROPion (WELLBUTRIN XL) 150 MG 24 hr tablet Take 3 tablets (450 mg total) by mouth daily. 12/12/22   Copland, Gwenlyn Found, MD  cephALEXin (KEFLEX) 500 MG capsule Take 1 capsule (500 mg total) by mouth 2 (two) times daily. 09/07/23   Copland, Gwenlyn Found, MD  cetirizine (ZYRTEC) 10 MG tablet Take 10 mg by mouth daily.    [provider]  Cholecalciferol (VITAMIN D) 2000 units CAPS Take by mouth.    [provider]  Cyanocobalamin 1000 MCG TBCR Take by mouth. 02/12/17   [provider]  fluconazole  (DIFLUCAN) 150 MG tablet Take 1 at onset of symptoms; repeat after 72 hours 12/29/22   Copland, Gwenlyn Found, MD  hydrocortisone (ANUSOL-HC) 25 MG suppository Place 1 suppository (25 mg total) rectally 2 (two) times daily. 01/22/23   Sherrilyn Rist, MD  Magnesium 300 MG CAPS Take by mouth.    [provider]  methylcellulose oral powder Take by mouth daily.    [provider]  spironolactone (ALDACTONE) 100 MG tablet TAKE 1/2 TABLET BY MOUTH DAILY AS NEEDED FOR SWELLING 01/15/23   Copland, Gwenlyn Found, MD  valACYclovir (VALTREX) 1000 MG tablet Take 2000 mg by mouth, repeat dose in 12 hours. 08/24/23   Copland, Gwenlyn Found, MD      Allergies    Crestor [rosuvastatin]    Review of Systems   Review of Systems  Constitutional:  Positive for diaphoresis and fatigue. Negative for chills and fever.  HENT:  Negative for congestion.   Respiratory:  Negative for cough, chest tightness, shortness of breath and wheezing.   Cardiovascular:  Negative for chest pain and palpitations.  Gastrointestinal:  Positive for abdominal pain, constipation and nausea. Negative for diarrhea, flatus and vomiting.  Genitourinary:  Positive for flank pain (r sided). Negative for dysuria and frequency.  Musculoskeletal:  Positive for back pain. Negative for neck pain and neck stiffness.  Skin:  Negative for rash and wound.  Neurological:  Positive for light-headedness.  Negative for headaches.  Psychiatric/Behavioral:  Negative for agitation and confusion.   All other systems reviewed and are negative.   Physical Exam Updated Vital Signs BP 114/74   Pulse 72   Temp 97.7 F (36.5 C)   Resp 19   Ht 5\' 2"  (1.575 m)   Wt 59.8 kg   SpO2 96%   BMI 24.11 kg/m  Physical Exam Vitals and nursing note reviewed.  Constitutional:      General: She is not in acute distress.    Appearance: She is well-developed. She is ill-appearing.  HENT:     Head: Normocephalic and atraumatic.     Nose: No congestion or  rhinorrhea.     Mouth/Throat:     Mouth: Mucous membranes are moist.  Eyes:     Extraocular Movements: Extraocular movements intact.     Conjunctiva/sclera: Conjunctivae normal.     Pupils: Pupils are equal, round, and reactive to light.  Cardiovascular:     Rate and Rhythm: Normal rate and regular rhythm.     Pulses: Normal pulses.     Heart sounds: No murmur heard. Pulmonary:     Effort: Pulmonary effort is normal. No respiratory distress.     Breath sounds: Normal breath sounds. No wheezing, rhonchi or rales.  Chest:     Chest wall: No tenderness.  Abdominal:     General: Abdomen is flat. Bowel sounds are decreased. There is no distension.     Palpations: Abdomen is soft.     Tenderness: There is abdominal tenderness in the right lower quadrant and periumbilical area. There is no guarding or rebound.  Musculoskeletal:        General: Tenderness present. No swelling.     Cervical back: Neck supple. No tenderness.     Right lower leg: No edema.     Left lower leg: No edema.  Skin:    General: Skin is warm and dry.     Capillary Refill: Capillary refill takes less than 2 seconds.     Findings: No erythema.  Neurological:     Mental Status: She is alert.  Psychiatric:        Mood and Affect: Mood normal.     ED Results / Procedures / Treatments   Labs (all labs ordered are listed, but only abnormal results are displayed) Labs Reviewed  COMPREHENSIVE METABOLIC PANEL - Abnormal; Notable for the following components:      Result Value   Sodium 131 (*)    Chloride 94 (*)    Glucose, Bld 123 (*)    All other components within normal limits  CBC - Abnormal; Notable for the following components:   WBC 11.5 (*)    Platelets 447 (*)    All other components within normal limits  LACTIC ACID, PLASMA - Abnormal; Notable for the following components:   Lactic Acid, Venous 2.1 (*)    All other components within normal limits  LACTIC ACID, PLASMA - Abnormal; Notable for the  following components:   Lactic Acid, Venous 2.4 (*)    All other components within normal limits  LIPASE, BLOOD  URINALYSIS, ROUTINE W REFLEX MICROSCOPIC    EKG EKG Interpretation Date/Time:  Tuesday October 06 2023 15:38:25 EST Ventricular Rate:  74 PR Interval:  153 QRS Duration:  85 QT Interval:  384 QTC Calculation: 426 R Axis:   67  Text Interpretation: Sinus rhythm no prior ECG for comparison No STEMI Confirmed by Theda Belfast ([id number]) on 10/06/2023 3:46:52 PM  Radiology CT ABDOMEN PELVIS W CONTRAST  Result Date: 10/06/2023 CLINICAL DATA:  Right lower quadrant abdominal pain EXAM: CT ABDOMEN AND PELVIS WITH CONTRAST TECHNIQUE: Multidetector CT imaging of the abdomen and pelvis was performed using the standard protocol following bolus administration of intravenous contrast. RADIATION DOSE REDUCTION: This exam was performed according to the departmental dose-optimization program which includes automated exposure control, adjustment of the mA and/or kV according to patient size and/or use of iterative reconstruction technique. CONTRAST:  OMNIPAQUE IOHEXOL 300 MG/ML  SOLN COMPARISON:  CT abdomen and pelvis dated 06/18/2022 FINDINGS: Lower chest: No focal consolidation or pulmonary nodule in the lung bases. No pleural effusion or pneumothorax demonstrated. Partially imaged heart size is normal. Hepatobiliary: No focal hepatic lesions. Mild periportal edema. No intra or extrahepatic biliary ductal dilation. Cholelithiasis. Pancreas: No focal lesions or main ductal dilation. Spleen: Normal in size without focal abnormality. Adrenals/Urinary Tract: No adrenal nodules. No suspicious renal mass, calculi or hydronephrosis. No focal bladder wall thickening. Stomach/Bowel: Normal appearance of the stomach. Markedly dilated cecum is inverted into the right upper quadrant with displacement of the hepatic flexure to the right lower quadrant where there is marked luminal narrowing and twisting of  the mesentery (602:72). Colonic diverticulosis without acute diverticulitis. Normal appendix. Vascular/Lymphatic: Aortic atherosclerosis. Retroaortic left renal vein. No enlarged abdominal or pelvic lymph nodes. Reproductive: No adnexal masses. Other: Trace free fluid. No free air or fluid collection. Punctate hypodensities along the anterior margin of the liver (301:24) are favored to represent peritoneal fat. Musculoskeletal: No acute or abnormal lytic or blastic osseous lesions. Multilevel degenerative changes of the partially imaged thoracic and lumbar spine. Unchanged grade 1 anterolisthesis at L4-5. IMPRESSION: 1. Markedly dilated cecum is inverted into the right upper quadrant with displacement of the hepatic flexure to the right lower quadrant where there is marked luminal narrowing and twisting of the mesentery, consistent with cecal volvulus. 2. Normal appendix. 3. Cholelithiasis. 4.  Aortic Atherosclerosis (ICD10-I70.0). These results were called by telephone at the time of interpretation on 10/06/2023 at 7:33 pm to provider Lynden Oxford, who verbally acknowledged these results. Electronically Signed   By: Agustin Cree M.D.   On: 10/06/2023 19:35    Procedures Procedures    CRITICAL CARE Performed by: Canary Brim Salima Rumer Total critical care time: 45 minutes Critical care time was exclusive of separately billable procedures and treating other patients. Critical care was necessary to treat or prevent imminent or life-threatening deterioration. Critical care was time spent personally by me on the following activities: development of treatment plan with patient and/or surrogate as well as nursing, discussions with consultants, evaluation of patient's response to treatment, examination of patient, obtaining history from patient or surrogate, ordering and performing treatments and interventions, ordering and review of laboratory studies, ordering and review of radiographic studies, pulse  oximetry and re-evaluation of patient's condition.  Medications Ordered in ED Medications  morphine (PF) 4 MG/ML injection 4 mg (4 mg Intravenous Given 10/06/23 1515)  ondansetron (ZOFRAN) injection 4 mg (4 mg Intravenous Given 10/06/23 1515)  sodium chloride 0.9 % bolus 1,000 mL (0 mLs Intravenous Stopped 10/06/23 1751)  iohexol (OMNIPAQUE) 300 MG/ML solution 100 mL (100 mLs Intravenous Contrast Given 10/06/23 1647)  sodium chloride 0.9 % bolus 1,000 mL (1,000 mLs Intravenous New Bag/Given 10/06/23 1933)    ED Course/ Medical Decision Making/ A&P  Medical Decision Making Amount and/or Complexity of Data Reviewed Labs: ordered.  Risk Prescription drug management.    Alishia Lebo is a 72 y.o. female with a past medical history significant for previous diverticulitis hyperlipidemia, anxiety, depression who presents for abdominal pain.  Point to patient, since yesterday she has been having pain in her right lower abdomen that has been progressive.  She describes as 10 out of 10 in severity now.  She said that she had some nausea but no vomiting.  She had to begin yesterday and started feel better so she tried to eat some breakfast and some lunch but then it rapidly worsened after eating today.  She reports she has not been passing gas and think she is more constipated.  She has been on steroids for the last week or so due to some knee inflammation and has had less bowel movement since starting it.  She reports no fevers, chills, congestion, cough.  Denies any chest pain or shortness of breath.  Reports some back ache that she has more chronically.  No trauma.  No rash to suggest shingles.  No other issues reported.  No urinary changes.  On exam, patient's right lower abdomen is very tender to palpation.  Bowel sounds were possibly diminished.  Chest nontender.  No murmur on my exam.  Patient had pulses in extremities.  While in triage getting screened, patient was  given some pain medicine as her blood pressure was not low and she was in such discomfort.  After getting some morphine, blood pressure dropped into the 60s and she was quickly brought back to a room and given fluids.  By the time I started evaluating her, blood pressure had improved over 100 systolic.  She was diaphoretic when feeling lightheaded after the medication but is still having the pain.  She reports no history of hypertension and denies history elevated blood pressures have low suspicion for a dissection as etiology of discomfort.  Clinically I am somewhat concerned about a diverticulitis on the right side, partial obstruction, appendicitis, or gallbladder versus kidney stone.  Will get a CT scan with contrast, get screening labs, give her the fluids, and be careful with pain and nausea medicine as needed.  Due to the patient's ill appearance, dissipate she will likely need admission for concerning finding on her workup.  Of note, CT scanner is currently under maintenance for approximately 15 more minutes before we can start getting images on the patient's.      Lactic found to be rising, will give more fluid.  7:55 PM Patient had CT scan that revealed cecal volvulus.  I quickly called general surgery at [facility] as patient has preference to go to [facility] over [facility].  Spoke to Dr. Bedelia Person who would like the patient to come ED to preop for evaluation and surgery.  She will remain NPO.  Vital signs reassuring on my reassessment.  They requested medicine admission so we will call for admission.         Final Clinical Impression(s) / ED Diagnoses Final diagnoses:  Cecal volvulus (HCC)  Right lower quadrant abdominal pain    Clinical Impression: 1. Cecal volvulus (HCC)   2. Right lower quadrant abdominal pain     Disposition: Admit and ED to preop transfer for OR with Dr. Bonita Quin for cecal volvulus followed by medicine admission.  This note was prepared with assistance  of Conservation officer, historic buildings. Occasional wrong-word or sound-a-like substitutions may have occurred due to  the inherent limitations of voice recognition software.      Amiaya Mcneeley, Canary Brim, MD 10/06/23 2011

## 2023-10-06 NOTE — Anesthesia Procedure Notes (Signed)
Procedure Name: Intubation Date/Time: 10/06/2023 10:45 PM  Performed by: Laruth Bouchard., CRNAPre-anesthesia Checklist: Patient identified, Emergency Drugs available, Suction available, Patient being monitored and Timeout performed Patient Re-evaluated:Patient Re-evaluated prior to induction Oxygen Delivery Method: Circle system utilized Preoxygenation: Pre-oxygenation with 100% oxygen Induction Type: IV induction and Rapid sequence Laryngoscope Size: Mac and 3 Grade View: Grade I Tube type: Oral Tube size: 6.5 mm Number of attempts: 1 Airway Equipment and Method: Stylet Placement Confirmation: ETT inserted through vocal cords under direct vision, positive ETCO2 and breath sounds checked- equal and bilateral Secured at: 22 cm Tube secured with: Tape Dental Injury: Teeth and Oropharynx as per pre-operative assessment

## 2023-10-06 NOTE — ED Triage Notes (Signed)
Pt states RLQ pain that started 1 hr ago  Denies fever, denies Vomiting, reports nausea r/t pain

## 2023-10-07 ENCOUNTER — Encounter (HOSPITAL_COMMUNITY): Payer: Self-pay | Admitting: Surgery

## 2023-10-07 ENCOUNTER — Other Ambulatory Visit: Payer: Self-pay

## 2023-10-07 DIAGNOSIS — E871 Hypo-osmolality and hyponatremia: Secondary | ICD-10-CM

## 2023-10-07 DIAGNOSIS — Z888 Allergy status to other drugs, medicaments and biological substances status: Secondary | ICD-10-CM | POA: Diagnosis not present

## 2023-10-07 DIAGNOSIS — E785 Hyperlipidemia, unspecified: Secondary | ICD-10-CM | POA: Diagnosis present

## 2023-10-07 DIAGNOSIS — Z8249 Family history of ischemic heart disease and other diseases of the circulatory system: Secondary | ICD-10-CM | POA: Diagnosis not present

## 2023-10-07 DIAGNOSIS — F32A Depression, unspecified: Secondary | ICD-10-CM | POA: Diagnosis present

## 2023-10-07 DIAGNOSIS — K562 Volvulus: Secondary | ICD-10-CM | POA: Diagnosis present

## 2023-10-07 DIAGNOSIS — Z833 Family history of diabetes mellitus: Secondary | ICD-10-CM | POA: Diagnosis not present

## 2023-10-07 DIAGNOSIS — Z7982 Long term (current) use of aspirin: Secondary | ICD-10-CM | POA: Diagnosis not present

## 2023-10-07 DIAGNOSIS — I700 Atherosclerosis of aorta: Secondary | ICD-10-CM | POA: Diagnosis present

## 2023-10-07 DIAGNOSIS — M25561 Pain in right knee: Secondary | ICD-10-CM | POA: Diagnosis present

## 2023-10-07 DIAGNOSIS — F419 Anxiety disorder, unspecified: Secondary | ICD-10-CM | POA: Diagnosis present

## 2023-10-07 DIAGNOSIS — Z83438 Family history of other disorder of lipoprotein metabolism and other lipidemia: Secondary | ICD-10-CM | POA: Diagnosis not present

## 2023-10-07 DIAGNOSIS — Z822 Family history of deafness and hearing loss: Secondary | ICD-10-CM | POA: Diagnosis not present

## 2023-10-07 DIAGNOSIS — R600 Localized edema: Secondary | ICD-10-CM | POA: Diagnosis present

## 2023-10-07 DIAGNOSIS — Z9889 Other specified postprocedural states: Secondary | ICD-10-CM

## 2023-10-07 DIAGNOSIS — Z8261 Family history of arthritis: Secondary | ICD-10-CM | POA: Diagnosis not present

## 2023-10-07 DIAGNOSIS — Z818 Family history of other mental and behavioral disorders: Secondary | ICD-10-CM | POA: Diagnosis not present

## 2023-10-07 DIAGNOSIS — Z79899 Other long term (current) drug therapy: Secondary | ICD-10-CM | POA: Diagnosis not present

## 2023-10-07 DIAGNOSIS — Z811 Family history of alcohol abuse and dependence: Secondary | ICD-10-CM | POA: Diagnosis not present

## 2023-10-07 LAB — CBC WITH DIFFERENTIAL/PLATELET
Abs Immature Granulocytes: 0.09 10*3/uL — ABNORMAL HIGH (ref 0.00–0.07)
Basophils Absolute: 0 10*3/uL (ref 0.0–0.1)
Basophils Relative: 0 %
Eosinophils Absolute: 0 10*3/uL (ref 0.0–0.5)
Eosinophils Relative: 0 %
HCT: 32.4 % — ABNORMAL LOW (ref 36.0–46.0)
Hemoglobin: 11.1 g/dL — ABNORMAL LOW (ref 12.0–15.0)
Immature Granulocytes: 1 %
Lymphocytes Relative: 3 %
Lymphs Abs: 0.6 10*3/uL — ABNORMAL LOW (ref 0.7–4.0)
MCH: 32.5 pg (ref 26.0–34.0)
MCHC: 34.3 g/dL (ref 30.0–36.0)
MCV: 94.7 fL (ref 80.0–100.0)
Monocytes Absolute: 0.8 10*3/uL (ref 0.1–1.0)
Monocytes Relative: 4 %
Neutro Abs: 16.9 10*3/uL — ABNORMAL HIGH (ref 1.7–7.7)
Neutrophils Relative %: 92 %
Platelets: 371 10*3/uL (ref 150–400)
RBC: 3.42 MIL/uL — ABNORMAL LOW (ref 3.87–5.11)
RDW: 13 % (ref 11.5–15.5)
WBC: 18.4 10*3/uL — ABNORMAL HIGH (ref 4.0–10.5)
nRBC: 0 % (ref 0.0–0.2)

## 2023-10-07 LAB — COMPREHENSIVE METABOLIC PANEL
ALT: 24 U/L (ref 0–44)
AST: 20 U/L (ref 15–41)
Albumin: 3 g/dL — ABNORMAL LOW (ref 3.5–5.0)
Alkaline Phosphatase: 66 U/L (ref 38–126)
Anion gap: 7 (ref 5–15)
BUN: 7 mg/dL — ABNORMAL LOW (ref 8–23)
CO2: 22 mmol/L (ref 22–32)
Calcium: 8 mg/dL — ABNORMAL LOW (ref 8.9–10.3)
Chloride: 102 mmol/L (ref 98–111)
Creatinine, Ser: 0.57 mg/dL (ref 0.44–1.00)
GFR, Estimated: >60 mL/min (ref >60)
Glucose, Bld: 136 mg/dL — ABNORMAL HIGH (ref 70–99)
Potassium: 4.2 mmol/L (ref 3.5–5.1)
Sodium: 131 mmol/L — ABNORMAL LOW (ref 135–145)
Total Bilirubin: 0.6 mg/dL (ref <1.2)
Total Protein: 5.2 g/dL — ABNORMAL LOW (ref 6.5–8.1)

## 2023-10-07 LAB — MAGNESIUM: Magnesium: 1.9 mg/dL (ref 1.7–2.4)

## 2023-10-07 LAB — LACTIC ACID, PLASMA
Lactic Acid, Venous: 1 mmol/L (ref 0.5–1.9)
Lactic Acid, Venous: 0.9 mmol/L (ref 0.5–1.9)

## 2023-10-07 MED ORDER — FENTANYL CITRATE (PF) 100 MCG/2ML IJ SOLN
INTRAMUSCULAR | Status: AC
  Filled 2023-10-07: qty 2

## 2023-10-07 MED ORDER — SODIUM CHLORIDE 0.9% FLUSH
3.0000 mL | Freq: Two times a day (BID) | INTRAVENOUS | Status: DC
  Administered 2023-10-07 – 2023-10-09 (×2): 3 mL via INTRAVENOUS

## 2023-10-07 MED ORDER — ACETAMINOPHEN 650 MG RE SUPP: 650.0000 mg | Freq: Four times a day (QID) | RECTAL | Status: DC | PRN

## 2023-10-07 MED ORDER — ENOXAPARIN SODIUM 40 MG/0.4ML IJ SOSY
40.0000 mg | PREFILLED_SYRINGE | Freq: Every day | INTRAMUSCULAR | Status: DC
  Administered 2023-10-07 – 2023-10-09 (×3): 40 mg via SUBCUTANEOUS
  Filled 2023-10-07 (×3): qty 0.4

## 2023-10-07 MED ORDER — ACETAMINOPHEN 325 MG PO TABS
650.0000 mg | ORAL_TABLET | Freq: Four times a day (QID) | ORAL | Status: DC | PRN
  Administered 2023-10-08 – 2023-10-09 (×3): 650 mg via ORAL
  Filled 2023-10-07 (×3): qty 2

## 2023-10-07 NOTE — H&P (Addendum)
History and Physical    Patient: Danielle Fitzpatrick WVP:[id number] DOB: July 03, 1951 DOA: 10/06/2023 DOS: the patient was seen and examined on 10/07/2023 PCP: Copland, Gwenlyn Found, MD  Patient coming from: Home  Chief Complaint:  Chief Complaint  Patient presents with   Abdominal Pain    Patient transferred to [facility] for surgical consult.   HPI: Danielle Fitzpatrick is a 72 y.o. female with medical history significant of dyslipidemia not on medications, chronic lower extremity edema on low-dose Aldactone, depression on Wellbutrin who presented to the hospital with 24 hours of right lower quadrant abdominal pain.  Workup in the ED revealed a colonic volvulus and patient was taken emergently to the OR for reduction of volvulus via exploratory laparotomy.  At this time she is clinically stable and has an NG tube in place for bowel decompression.  Hospitalist service has been asked to evaluate the patient for admission.  In my discussion with the patient she currently reports controlled pain.  No other major medical history other than as outlined above.  She is not nauseous and has not had any emesis around her NG tube.  She has not passed any flatus but feels like she may do so soon.  She has not been out of the bed to ambulate as of yet.  Patient has been experiencing right knee pain and has recently been on a steroid taper.  Review of Systems: As mentioned in the history of present illness. All other systems reviewed and are negative.   Past Medical History:  Diagnosis Date   Allergy    Anxiety    Arthritis    Chicken pox    Depression    Diverticulitis    Diverticulosis    Hyperlipidemia    Past Surgical History:  Procedure Laterality Date   AUGMENTATION MAMMAPLASTY     Social History:  reports that she has never smoked. She has never used smokeless tobacco. She reports current alcohol use. She reports that she does not use drugs.  Allergies  Allergen Reactions    Crestor [Rosuvastatin]     Shakes and tremors    Family History  Problem Relation Age of Onset   Depression Mother    Hyperlipidemia Mother    Hypertension Mother    Hypertension Father    Hyperlipidemia Father    Heart disease Father    Hearing loss Father    Diabetes Father    Depression Father    Rheum arthritis Father    Depression Sister    Alcohol abuse Brother    Depression Brother    Early death Brother    Heart attack Brother    Hypertension Brother    Hearing loss Maternal Grandmother    Diabetes Maternal Grandfather    Early death Maternal Grandfather    Heart disease Maternal Grandfather    Alcohol abuse Paternal Grandmother    Early death Paternal Grandmother    Hyperlipidemia Daughter    Anxiety disorder Son    Wilson's disease Neg Hx    Colon cancer Neg Hx    Stomach cancer Neg Hx    Esophageal cancer Neg Hx    Rectal cancer Neg Hx     Prior to Admission medications   Medication Sig Start Date End Date Taking? Authorizing Provider  acetaminophen (TYLENOL) 500 MG tablet Take 500 mg by mouth 3 (three) times daily as needed for moderate pain (lower back pain).   Yes [provider]  aspirin EC 81  MG tablet Take 81 mg by mouth. 10/20/12  Yes [provider]  B Complex Vitamins (VITAMIN-B COMPLEX PO) Take by mouth.   Yes [provider]  Biotin 19147 MCG TABS Take by mouth.   Yes [provider]  buPROPion (WELLBUTRIN XL) 150 MG 24 hr tablet Take 3 tablets (450 mg total) by mouth daily. 12/12/22  Yes Copland, Gwenlyn Found, MD  cetirizine (ZYRTEC) 10 MG tablet Take 10 mg by mouth daily.   Yes [provider]  Cholecalciferol (VITAMIN D) 2000 units CAPS Take by mouth.   Yes [provider]  Cyanocobalamin 1000 MCG TBCR Take by mouth. 02/12/17  Yes [provider]  hydrocortisone (ANUSOL-HC) 25 MG suppository Place 1 suppository (25 mg total) rectally 2 (two) times daily. 01/22/23  Yes Danis, Andreas Blower,  MD  Magnesium 300 MG CAPS Take by mouth.   Yes [provider]  spironolactone (ALDACTONE) 100 MG tablet TAKE 1/2 TABLET BY MOUTH DAILY AS NEEDED FOR SWELLING 01/15/23  Yes Copland, Gwenlyn Found, MD  valACYclovir (VALTREX) 1000 MG tablet Take 2000 mg by mouth, repeat dose in 12 hours. Patient taking differently: 1,000 mg every 12 (twelve) hours as needed. Take 2000 mg by mouth, repeat dose in 12 hours. 08/24/23  Yes Copland, Gwenlyn Found, MD    Physical Exam: Vitals:   10/07/23 0100 10/07/23 0114 10/07/23 0357 10/07/23 0519  BP: 126/70 122/65  127/74  Pulse: 74 77  89  Resp: 17   16  Temp: (!) 97.1 F (36.2 C) 97.8 F (36.6 C)  98.5 F (36.9 C)  TempSrc:  Oral  Oral  SpO2: 95% 100%  95%  Weight:   66.1 kg   Height:       Constitutional: NAD, calm, comfortable Respiratory: clear to auscultation bilaterally, no wheezing, no crackles. Normal respiratory effort. No accessory muscle use. RA Cardiovascular: Regular rate and rhythm, no murmurs / rubs / gallops. No extremity edema. 2+ pedal pulses.   Abdomen: Minimally tender at surgical site, NG tube to low wall suction.No hepatosplenomegaly.  Bowel sounds not auscultated..  Musculoskeletal: no clubbing / cyanosis. No joint deformity upper and lower extremities. Good ROM, no contractures. Normal muscle tone.  Skin: no rashes, lesions, ulcers. No induration Neurologic: CN 2-12 grossly intact. Sensation intact, Strength 5/5 x all 4 extremities.  Psychiatric: Normal judgment and insight. Alert and oriented x 3. Normal mood.    Data Reviewed:  Initial sodium 131, potassium 3.7, chloride 94, CO2 26, glucose 123, BUN 13, creatinine 0.79, LFTs are normal Rpt Na 131  Follow-up a.m. electrolyte panel pending  Initial lactic acid 2.1 with follow-up 2.4 preop.  Postop lactic acid down to 1.0  White count 11,500, hemoglobin 12.4, platelets 447,000.  Follow-up CBC this morning white count 18,400, hemoglobin 11.1, platelets 371,000 with  neutrophils 92% noting that patient had been on steroids prior to admission.  Urinalysis preoperatively was unremarkable  Assessment and Plan: Colonic volvulus status post urgent surgical exploratory laparotomy Clinically stable Continue NG tube for now-bowel sounds are active and no flatus.  Final determination regarding discontinuation of NG tube and diet advance per surgical team Continue symptom management with as needed IV Toradol, IV Robaxin, IV morphine, and IV Zofran Anticipate begin mobility today with out of bed to chair and possible ambulation in room with assistance of nursing staff Continue IVFs until NG out and tolerating oral intake  Acute hyponatremia Likely due to volume depletion- mild Na+ 131-baseline 136-141 Also on Wellbutrin but doubt  contributing since baseline Na+ normal  Mild hypoalbunemia Due recent volvulus and abdominal surgery Baseline 4,2  Dyslipidemia Patient reports intolerance to prior medication and is diet controlled at home  Depression and anxiety On Wellbutrin XL at home.  Chronic lower extremity edema Patient takes 50 mg of spironolactone 3 times daily  Right knee pain Recently on steroid taper Patient reports workup in progress.  I do not see any imaging of the right knee in Epic PT evaluation 2/2 patient reporting difficulty mobilizing prior to admission    Advance Care Planning:   Code Status: Full Code   VTE prophylaxis: SCDs-recommend early ambulation and if possible transition to pharmacological DVT prophylaxis at discretion of surgical team  Consults: General Surgery  Family Communication: Family at bedside  Severity of Illness: The appropriate patient status for this patient is INPATIENT. Inpatient status is judged to be reasonable and necessary in order to provide the required intensity of service to ensure the patient's safety. The patient's presenting symptoms, physical exam findings, and initial radiographic and  laboratory data in the context of their chronic comorbidities is felt to place them at high risk for further clinical deterioration. Furthermore, it is not anticipated that the patient will be medically stable for discharge from the hospital within 2 midnights of admission.   * I certify that at the point of admission it is my clinical judgment that the patient will require inpatient hospital care spanning beyond 2 midnights from the point of admission due to high intensity of service, high risk for further deterioration and high frequency of surveillance required.*  Author: Junious Silk, NP 10/07/2023 6:17 AM  For on call review [email or web address].

## 2023-10-07 NOTE — Transfer of Care (Signed)
Immediate Anesthesia Transfer of Care Note  Patient: Danielle Fitzpatrick  Procedure(s) Performed: EXPLORATORY LAPAROTOMY (Abdomen)  Patient Location: PACU  Anesthesia Type:General  Level of Consciousness: awake, alert , and oriented  Airway & Oxygen Therapy: Patient Spontanous Breathing  Post-op Assessment: Report given to RN and Post -op Vital signs reviewed and stable  Post vital signs: Reviewed and stable  Last Vitals:  Vitals Value Taken Time  BP 123/85 10/07/23 0001  Temp 36.2 C 10/07/23 0001  Pulse 73 10/07/23 0007  Resp 14 10/07/23 0007  SpO2 95 % 10/07/23 0007  Vitals shown include unfiled device data.  Last Pain:  Vitals:   10/07/23 0001  TempSrc:   PainSc: 10-Worst pain ever         Complications: No notable events documented.

## 2023-10-07 NOTE — Progress Notes (Signed)
(  Carryover admission to the Day Admitter; accepted by Dr.  Joneen Roach as transfer from  [facility]  to a  med-surg bed at  [facility]  for volvulus of the cecum. Please see Dr.  Lajoyce Lauber transfer documentation for additional details).  Upon arrival at [facility], the patient was taken directly to the OR by Dr. Bedelia Person of general surgery, recovered in pacu, and is now in med-surg bed on 6N.   I have placed some additional preliminary admit orders via the adult multi-morbid admission order set. I have also ordered morning labs in the form of CMP, CBC, and magnesium level.   I have noted existing orders for analgesic intervention, prn IV Zofran, as well as IV fluids. Is currently npo.     Newton Pigg, DO Hospitalist

## 2023-10-07 NOTE — Progress Notes (Signed)
Patient ID: Danielle Fitzpatrick, female   DOB: 1951/09/06, 72 y.o.   MRN: [id number] [facility] Surgery Progress Note  1 Day Post-Op  Subjective: CC-  Family at bedside. Patient is sore and nauseated this morning. Has not been OOB yet. Passing flatus, no BM.  PTA lives at home alone  Objective: Vital signs in last 24 hours: Temp:  [97.1 F (36.2 C)-98.5 F (36.9 C)] 98.5 F (36.9 C) (11/06 0519) Pulse Rate:  [56-103] 86 (11/06 0801) Resp:  [12-27] 17 (11/06 0801) BP: (63-163)/(43-98) 109/71 (11/06 0801) SpO2:  [93 %-100 %] 93 % (11/06 0801) Weight:  [59.8 kg-66.1 kg] 66.1 kg (11/06 0357)    Intake/Output from previous day: 11/05 0701 - 11/06 0700 In: 1236.2 [I.V.:1236.2] Out: 1875 [Urine:1725; Emesis/NG output:100; Blood:50] Intake/Output this shift: No intake/output data recorded.  PE: Gen:  Alert, NAD, pleasant HEENT: NG flushed and is working again [employer]:  rate and effort normal Abd: Soft, mild distension, hypoactive BS, midline with honeycomb present and small amount of blood noted on dressing  Lab Results:  Recent Labs    10/06/23 1509 10/07/23 0604  WBC 11.5* 18.4*  HGB 12.4 11.1*  HCT 36.7 32.4*  PLT 447* 371   BMET Recent Labs    10/06/23 1509 10/07/23 0604  NA 131* 131*  K 3.7 4.2  CL 94* 102  CO2 26 22  GLUCOSE 123* 136*  BUN 13 7*  CREATININE 0.79 0.57  CALCIUM 9.1 8.0*   PT/INR No results for input(s): "LABPROT", "INR" in the last 72 hours. CMP     Component Value Date/Time   NA 131 (L) 10/07/2023 0604   NA 141 02/21/2017 0000   K 4.2 10/07/2023 0604   CL 102 10/07/2023 0604   CO2 22 10/07/2023 0604   GLUCOSE 136 (H) 10/07/2023 0604   BUN 7 (L) 10/07/2023 0604   BUN 12 02/21/2017 0000   CREATININE 0.57 10/07/2023 0604   CREATININE 0.62 09/13/2020 1003   CALCIUM 8.0 (L) 10/07/2023 0604   PROT 5.2 (L) 10/07/2023 0604   ALBUMIN 3.0 (L) 10/07/2023 0604   AST 20 10/07/2023 0604   ALT 24 10/07/2023 0604   ALKPHOS 66 10/07/2023 0604    BILITOT 0.6 10/07/2023 0604   GFRNONAA >60 10/07/2023 0604   Lipase     Component Value Date/Time   LIPASE 48 10/06/2023 1509       Studies/Results: CT ABDOMEN PELVIS W CONTRAST  Result Date: 10/06/2023 CLINICAL DATA:  Right lower quadrant abdominal pain EXAM: CT ABDOMEN AND PELVIS WITH CONTRAST TECHNIQUE: Multidetector CT imaging of the abdomen and pelvis was performed using the standard protocol following bolus administration of intravenous contrast. RADIATION DOSE REDUCTION: This exam was performed according to the departmental dose-optimization program which includes automated exposure control, adjustment of the mA and/or kV according to patient size and/or use of iterative reconstruction technique. CONTRAST:  OMNIPAQUE IOHEXOL 300 MG/ML  SOLN COMPARISON:  CT abdomen and pelvis dated 06/18/2022 FINDINGS: Lower chest: No focal consolidation or pulmonary nodule in the lung bases. No pleural effusion or pneumothorax demonstrated. Partially imaged heart size is normal. Hepatobiliary: No focal hepatic lesions. Mild periportal edema. No intra or extrahepatic biliary ductal dilation. Cholelithiasis. Pancreas: No focal lesions or main ductal dilation. Spleen: Normal in size without focal abnormality. Adrenals/Urinary Tract: No adrenal nodules. No suspicious renal mass, calculi or hydronephrosis. No focal bladder wall thickening. Stomach/Bowel: Normal appearance of the stomach. Markedly dilated cecum is inverted into the right upper quadrant with displacement of the  hepatic flexure to the right lower quadrant where there is marked luminal narrowing and twisting of the mesentery (602:72). Colonic diverticulosis without acute diverticulitis. Normal appendix. Vascular/Lymphatic: Aortic atherosclerosis. Retroaortic left renal vein. No enlarged abdominal or pelvic lymph nodes. Reproductive: No adnexal masses. Other: Trace free fluid. No free air or fluid collection. Punctate hypodensities along the  anterior margin of the liver (301:24) are favored to represent peritoneal fat. Musculoskeletal: No acute or abnormal lytic or blastic osseous lesions. Multilevel degenerative changes of the partially imaged thoracic and lumbar spine. Unchanged grade 1 anterolisthesis at L4-5. IMPRESSION: 1. Markedly dilated cecum is inverted into the right upper quadrant with displacement of the hepatic flexure to the right lower quadrant where there is marked luminal narrowing and twisting of the mesentery, consistent with cecal volvulus. 2. Normal appendix. 3. Cholelithiasis. 4.  Aortic Atherosclerosis (ICD10-I70.0). These results were called by telephone at the time of interpretation on 10/06/2023 at 7:33 pm to provider Lynden Oxford, who verbally acknowledged these results. Electronically Signed   By: Agustin Cree M.D.   On: 10/06/2023 19:35    Anti-infectives: Anti-infectives (From admission, onward)    None        Assessment/Plan Cecal volvulus   POD#1 s/p exploratory laparotomy 11/5 Dr. Bedelia Person - intra-op: all bowel appeared to be viable, the colon was detorsed - Continue NPO/NGT to LIWS and await return in bowel function - mobilize, PT consult - on scheduled IV tylenol, toradol, robaxin  ID - none FEN - IVF, NPO/NGT to LIWS VTE - SCDs, lovenox Foley - none  HLD Depression Chronic LE edema    LOS: 0 days    Franne Forts, [facility] 10/07/2023, 9:40 AM Please see Amion for pager number during day hours 7:00am-4:30pm

## 2023-10-07 NOTE — Evaluation (Signed)
Physical Therapy Evaluation Patient Details Name: Danielle Fitzpatrick MRN: [id number] DOB: [birth year] Today's Date: 10/07/2023  History of Present Illness  72 yo female presents to ED with abdominal pain due to cecal volvulus, s/p ex lap and colon detorsed. PMH includes diverticulitis, hyperlipidemia, anxiety, depression.  Clinical Impression  Pt presents with min abdominal pain, impaired balance, and impaired activity tolerance. Pt to benefit from acute PT to address deficits. Pt ambulated short room distances to and from bathroom, pt limited by nausea and abdominal pain. Pt with stable BP with positional changes (see below), does have min lightheadedness but persistent. PT anticipates good progress with continued mobility while acute. PT to progress mobility as tolerated, and will continue to follow acutely.      BP, HR: -supine: 118/71, 87 -sitting: 138/78, 87 -post-gait: 135/73, 80      If plan is discharge home, recommend the following: A little help with walking and/or transfers;A little help with bathing/dressing/bathroom   Can travel by private vehicle        Equipment Recommendations None recommended by PT  Recommendations for Other Services       Functional Status Assessment Patient has had a recent decline in their functional status and demonstrates the ability to make significant improvements in function in a reasonable and predictable amount of time.     Precautions / Restrictions Precautions Precautions: Other (comment) Precaution Comments: abdominal - instructed pt in bracing with pillow as needed and log roll Restrictions Weight Bearing Restrictions: No      Mobility  Bed Mobility Overal bed mobility: Needs Assistance Bed Mobility: Rolling, Sidelying to Sit Rolling: Min assist Sidelying to sit: Min assist       General bed mobility comments: light rise assist, cues for log roll for abdominal protection and comfort    Transfers Overall transfer level: Needs  assistance Equipment used: 1 person hand held assist Transfers: Sit to/from Stand Sit to Stand: Min assist           General transfer comment: light rise assist initially, stand x2 from EOB and toilet.    Ambulation/Gait Ambulation/Gait assistance: Contact guard assist Gait Distance (Feet): 30 Feet Assistive device: None Gait Pattern/deviations: Step-through pattern, Trunk flexed, Decreased stride length Gait velocity: decr     General Gait Details: close guard for safety, slowed steps and reaching for environment to self-steady as needed  Stairs            Wheelchair Mobility     Tilt Bed    Modified Rankin (Stroke Patients Only)       Balance Overall balance assessment: Needs assistance Sitting-balance support: No upper extremity supported, Feet supported Sitting balance-Leahy Scale: Good     Standing balance support: No upper extremity supported, During functional activity Standing balance-Leahy Scale: Fair                               Pertinent Vitals/Pain Pain Assessment Pain Assessment: Faces Faces Pain Scale: Hurts a little bit Pain Location: abdomen, during stand>sit Pain Descriptors / Indicators: Sore, Discomfort Pain Intervention(s): Limited activity within patient's tolerance, Monitored during session, Repositioned    Home Living Family/patient expects to be discharged to:: Private residence Living Arrangements: Alone Available Help at Discharge: Family;Available PRN/intermittently Type of Home: House Home Access: Stairs to enter   Entrance Stairs-Number of Steps: 1   Home Layout: One level Home Equipment: Gilmer Mor - single point      Prior Function Prior  Level of Function : Independent/Modified Independent                     Extremity/Trunk Assessment   Upper Extremity Assessment Upper Extremity Assessment: Defer to OT evaluation    Lower Extremity Assessment Lower Extremity Assessment: Overall WFL for  tasks assessed    Cervical / Trunk Assessment Cervical / Trunk Assessment: Other exceptions Cervical / Trunk Exceptions: abd surgery  Communication   Communication Communication: No apparent difficulties  Cognition Arousal: Alert Behavior During Therapy: WFL for tasks assessed/performed Overall Cognitive Status: Within Functional Limits for tasks assessed                                          General Comments      Exercises     Assessment/Plan    PT Assessment Patient needs continued PT services  PT Problem List Decreased mobility;Decreased activity tolerance;Decreased balance;Pain;Decreased safety awareness;Decreased knowledge of use of DME       PT Treatment Interventions DME instruction;Gait training;Functional mobility training;Therapeutic activities;Patient/family education;Neuromuscular re-education;Stair training;Balance training    PT Goals (Current goals can be found in the Care Plan section)  Acute Rehab PT Goals Patient Stated Goal: home PT Goal Formulation: With patient/family Time For Goal Achievement: 10/21/23 Potential to Achieve Goals: Good    Frequency Min 1X/week     Co-evaluation               AM-PAC PT "6 Clicks" Mobility  Outcome Measure Help needed turning from your back to your side while in a flat bed without using bedrails?: A Little Help needed moving from lying on your back to sitting on the side of a flat bed without using bedrails?: A Little Help needed moving to and from a bed to a chair (including a wheelchair)?: A Little Help needed standing up from a chair using your arms (e.g., wheelchair or bedside chair)?: A Little Help needed to walk in hospital room?: A Little Help needed climbing 3-5 steps with a railing? : A Lot 6 Click Score: 17    End of Session   Activity Tolerance: Patient tolerated treatment well;Other (comment) (limited by nausea) Patient left: in chair;with family/visitor present;Other  (comment) (pt to call for RN when ready for back to bed) Nurse Communication: Mobility status PT Visit Diagnosis: Other abnormalities of gait and mobility (R26.89)    Time: 1610-9604 PT Time Calculation (min) (ACUTE ONLY): 26 min   Charges:   PT Evaluation $PT Eval Low Complexity: 1 Low PT Treatments $Therapeutic Activity: 8-22 mins PT General Charges $$ ACUTE PT VISIT: 1 Visit         Marye Round, PT DPT Acute Rehabilitation Services Secure Chat Preferred  Office [phone]   Matthews Franks E Christain [place] 10/07/2023, 10:39 AM

## 2023-10-07 NOTE — Anesthesia Postprocedure Evaluation (Signed)
Anesthesia Post Note  Patient: Danielle Fitzpatrick  Procedure(s) Performed: EXPLORATORY LAPAROTOMY (Abdomen)     Patient location during evaluation: PACU Anesthesia Type: General Level of consciousness: awake and alert Pain management: pain level controlled Vital Signs Assessment: post-procedure vital signs reviewed and stable Respiratory status: spontaneous breathing, nonlabored ventilation, respiratory function stable and patient connected to nasal cannula oxygen Cardiovascular status: blood pressure returned to baseline and stable Postop Assessment: no apparent nausea or vomiting Anesthetic complications: no   No notable events documented.  Last Vitals:  Vitals:   10/07/23 0015 10/07/23 0030  BP: 117/69 117/74  Pulse: 73 76  Resp: 13 16  Temp:    SpO2: 98% 99%    Last Pain:  Vitals:   10/07/23 0030  TempSrc:   PainSc: 9                  Collene Schlichter

## 2023-10-07 NOTE — Hospital Course (Addendum)
72 year old woman on Wellbutrin for depression and Aldactone for lower extremity edema who presented with abdominal pain.  CT abdomen pelvis showed volvulus.  She was taken urgently to the OR for exploratory laparotomy and detorsion of the colon.    Consultants General surgery   Procedures 11/5 exploratory laparotomy

## 2023-10-08 DIAGNOSIS — Z9889 Other specified postprocedural states: Secondary | ICD-10-CM | POA: Diagnosis not present

## 2023-10-08 DIAGNOSIS — K562 Volvulus: Secondary | ICD-10-CM | POA: Diagnosis not present

## 2023-10-08 DIAGNOSIS — E871 Hypo-osmolality and hyponatremia: Secondary | ICD-10-CM | POA: Diagnosis not present

## 2023-10-08 LAB — BASIC METABOLIC PANEL
Anion gap: 11 (ref 5–15)
BUN: 8 mg/dL (ref 8–23)
CO2: 23 mmol/L (ref 22–32)
Calcium: 8.3 mg/dL — ABNORMAL LOW (ref 8.9–10.3)
Chloride: 102 mmol/L (ref 98–111)
Creatinine, Ser: 0.57 mg/dL (ref 0.44–1.00)
GFR, Estimated: >60 mL/min (ref >60)
Glucose, Bld: 82 mg/dL (ref 70–99)
Potassium: 3.7 mmol/L (ref 3.5–5.1)
Sodium: 136 mmol/L (ref 135–145)

## 2023-10-08 LAB — CBC
HCT: 33.4 % — ABNORMAL LOW (ref 36.0–46.0)
Hemoglobin: 11.4 g/dL — ABNORMAL LOW (ref 12.0–15.0)
MCH: 32.3 pg (ref 26.0–34.0)
MCHC: 34.1 g/dL (ref 30.0–36.0)
MCV: 94.6 fL (ref 80.0–100.0)
Platelets: 386 10*3/uL (ref 150–400)
RBC: 3.53 MIL/uL — ABNORMAL LOW (ref 3.87–5.11)
RDW: 13.4 % (ref 11.5–15.5)
WBC: 10.6 10*3/uL — ABNORMAL HIGH (ref 4.0–10.5)
nRBC: 0 % (ref 0.0–0.2)

## 2023-10-08 MED ORDER — SODIUM CHLORIDE 0.9 % IV SOLN: INTRAVENOUS | Status: DC

## 2023-10-08 MED ORDER — LORATADINE 10 MG PO TABS
10.0000 mg | ORAL_TABLET | Freq: Every day | ORAL | Status: DC
  Filled 2023-10-08 (×2): qty 1

## 2023-10-08 MED ORDER — BUPROPION HCL ER (XL) 150 MG PO TB24
450.0000 mg | ORAL_TABLET | Freq: Every day | ORAL | Status: DC
  Administered 2023-10-08 – 2023-10-09 (×2): 450 mg via ORAL
  Filled 2023-10-08 (×2): qty 3

## 2023-10-08 NOTE — Progress Notes (Addendum)
   10/08/23 0931  Mobility  Activity Ambulated independently in hallway;Ambulated independently to bathroom  Level of Assistance Standby assist, set-up cues, supervision of patient - no hands on  Assistive Device None  Distance Ambulated (ft) 600 ft  Activity Response Tolerated well  Mobility Referral Yes  $Mobility charge 1 Mobility  Mobility Specialist Start Time (ACUTE ONLY) 0900  Mobility Specialist Stop Time (ACUTE ONLY) 0920  Mobility Specialist Time Calculation (min) (ACUTE ONLY) 20 min   Mobility Specialist: Progress Note  Pt agreeable to mobility session - received EOB. Required SB with no AD. C/o legs burning. Returned to chair with all needs met - call bell within reach. Daughter present.  Barnie Mort, BS Mobility Specialist Please contact via SecureChat or Rehab office at [phone].

## 2023-10-08 NOTE — Plan of Care (Signed)

## 2023-10-08 NOTE — Progress Notes (Signed)
Patient ID: Danielle Fitzpatrick, female   DOB: January 13, 1951, 72 y.o.   MRN: [id number] [facility] Surgery Progress Note  2 Days Post-Op  Subjective: No nausea, passing flatus. Eager to walk today.  Objective: Vital signs in last 24 hours: Temp:  [97.2 F (36.2 C)-98.4 F (36.9 C)] 98.4 F (36.9 C) (11/07 0424) Pulse Rate:  [81-85] 82 (11/07 0424) Resp:  [17-22] 18 (11/07 0424) BP: (121-142)/(67-81) 142/81 (11/07 0424) SpO2:  [95 %] 95 % (11/07 0424) Weight:  [67.2 kg] 67.2 kg (11/07 0425)    Intake/Output from previous day: 11/06 0701 - 11/07 0700 In: 1833.1 [P.O.:100; I.V.:1433.1; IV Piggyback:300] Out: 525 [Urine:300; Emesis/NG output:225] Intake/Output this shift: No intake/output data recorded.  PE: Gen:  Alert, NAD, pleasant HEENT: NG draining gastric contents Pulm:  rate and effort normal Abd: Soft, nondistended, nontender to palpation, honeycomb dressing with scant serosanguinous drainage.  Lab Results:  Recent Labs    10/06/23 1509 10/07/23 0604  WBC 11.5* 18.4*  HGB 12.4 11.1*  HCT 36.7 32.4*  PLT 447* 371   BMET Recent Labs    10/06/23 1509 10/07/23 0604  NA 131* 131*  K 3.7 4.2  CL 94* 102  CO2 26 22  GLUCOSE 123* 136*  BUN 13 7*  CREATININE 0.79 0.57  CALCIUM 9.1 8.0*   PT/INR No results for input(s): "LABPROT", "INR" in the last 72 hours. CMP     Component Value Date/Time   NA 131 (L) 10/07/2023 0604   NA 141 02/21/2017 0000   K 4.2 10/07/2023 0604   CL 102 10/07/2023 0604   CO2 22 10/07/2023 0604   GLUCOSE 136 (H) 10/07/2023 0604   BUN 7 (L) 10/07/2023 0604   BUN 12 02/21/2017 0000   CREATININE 0.57 10/07/2023 0604   CREATININE 0.62 09/13/2020 1003   CALCIUM 8.0 (L) 10/07/2023 0604   PROT 5.2 (L) 10/07/2023 0604   ALBUMIN 3.0 (L) 10/07/2023 0604   AST 20 10/07/2023 0604   ALT 24 10/07/2023 0604   ALKPHOS 66 10/07/2023 0604   BILITOT 0.6 10/07/2023 0604   GFRNONAA >60 10/07/2023 0604   Lipase     Component Value Date/Time    LIPASE 48 10/06/2023 1509       Studies/Results: CT ABDOMEN PELVIS W CONTRAST  Result Date: 10/06/2023 CLINICAL DATA:  Right lower quadrant abdominal pain EXAM: CT ABDOMEN AND PELVIS WITH CONTRAST TECHNIQUE: Multidetector CT imaging of the abdomen and pelvis was performed using the standard protocol following bolus administration of intravenous contrast. RADIATION DOSE REDUCTION: This exam was performed according to the departmental dose-optimization program which includes automated exposure control, adjustment of the mA and/or kV according to patient size and/or use of iterative reconstruction technique. CONTRAST:  OMNIPAQUE IOHEXOL 300 MG/ML  SOLN COMPARISON:  CT abdomen and pelvis dated 06/18/2022 FINDINGS: Lower chest: No focal consolidation or pulmonary nodule in the lung bases. No pleural effusion or pneumothorax demonstrated. Partially imaged heart size is normal. Hepatobiliary: No focal hepatic lesions. Mild periportal edema. No intra or extrahepatic biliary ductal dilation. Cholelithiasis. Pancreas: No focal lesions or main ductal dilation. Spleen: Normal in size without focal abnormality. Adrenals/Urinary Tract: No adrenal nodules. No suspicious renal mass, calculi or hydronephrosis. No focal bladder wall thickening. Stomach/Bowel: Normal appearance of the stomach. Markedly dilated cecum is inverted into the right upper quadrant with displacement of the hepatic flexure to the right lower quadrant where there is marked luminal narrowing and twisting of the mesentery (602:72). Colonic diverticulosis without acute diverticulitis. Normal appendix. Vascular/Lymphatic:  Aortic atherosclerosis. Retroaortic left renal vein. No enlarged abdominal or pelvic lymph nodes. Reproductive: No adnexal masses. Other: Trace free fluid. No free air or fluid collection. Punctate hypodensities along the anterior margin of the liver (301:24) are favored to represent peritoneal fat. Musculoskeletal: No acute or  abnormal lytic or blastic osseous lesions. Multilevel degenerative changes of the partially imaged thoracic and lumbar spine. Unchanged grade 1 anterolisthesis at L4-5. IMPRESSION: 1. Markedly dilated cecum is inverted into the right upper quadrant with displacement of the hepatic flexure to the right lower quadrant where there is marked luminal narrowing and twisting of the mesentery, consistent with cecal volvulus. 2. Normal appendix. 3. Cholelithiasis. 4.  Aortic Atherosclerosis (ICD10-I70.0). These results were called by telephone at the time of interpretation on 10/06/2023 at 7:33 pm to provider Lynden Oxford, who verbally acknowledged these results. Electronically Signed   By: Agustin Cree M.D.   On: 10/06/2023 19:35    Anti-infectives: Anti-infectives (From admission, onward)    None        Assessment/Plan Cecal volvulus   POD#2 s/p exploratory laparotomy 11/5 Dr. Bedelia Person - intra-op: all bowel appeared to be viable, the colon was detorsed - Passing flatus, NG tube removed - Advance to clear liquids - mobilize, PT consulted - on scheduled IV tylenol, toradol, robaxin - Resumed home wellbutrin, discussed with patient VTE - SCDs, lovenox    LOS: 1 day    Fritzi Mandes, MD [facility] Surgery 10/08/2023, 8:59 AM Please see Amion for pager number during day hours 7:00am-4:30pm

## 2023-10-08 NOTE — TOC Initial Note (Signed)
Transition of Care (TOC) - Initial/Assessment Note   Spoke to patient at bedside. Discussed home health PT/OT/RN patient agreeable . Aware home health will not be in the home daily or for long peroids of time.   Cory with [facility] accepted referral.   Confirmed face sheet information. Patient lives alone. Both children work  Patient Details  Name: Danielle Fitzpatrick MRN: [id number] Date of Birth: 10/04/1951  Transition of Care [facility]) CM/SW Contact:    Kingsley Plan, RN Phone Number: 10/08/2023, 12:13 PM  Clinical Narrative:                   Expected Discharge Plan: Home w Home Health Services Barriers to Discharge: Continued Medical Work up   Patient Goals and CMS Choice Patient states their goals for this hospitalization and ongoing recovery are:: to return to home CMS Medicare.gov Compare Post Acute Care list provided to:: Patient Choice offered to / list presented to : Patient      Expected Discharge Plan and Services   Discharge Planning Services: CM Consult Post Acute Care Choice: Home Health Living arrangements for the past 2 months: Single Family Home                 DME Arranged: N/A         HH Arranged: RN, PT, OT HH Agency: [facility] Date [facility] Agency Contacted: 10/08/23 Time HH Agency Contacted: 1213 Representative spoke with at [facility] Agency: Kandee Keen  Prior Living Arrangements/Services Living arrangements for the past 2 months: Single Family Home Lives with:: Self Patient language and need for interpreter reviewed:: Yes Do you feel safe going back to the place where you live?: Yes      Need for Family Participation in Patient Care: Yes (Comment) Care giver support system in place?: Yes (comment)   Criminal Activity/Legal Involvement Pertinent to Current Situation/Hospitalization: No - Comment as needed  Activities of Daily Living   ADL Screening (condition at time of admission) Independently performs ADLs?: Yes (appropriate for developmental  age) Is the patient deaf or have difficulty hearing?: No Does the patient have difficulty seeing, even when wearing glasses/contacts?: Yes Does the patient have difficulty concentrating, remembering, or making decisions?: No  Permission Sought/Granted   Permission granted to share information with : No              Emotional Assessment Appearance:: Appears stated age Attitude/Demeanor/Rapport: Engaged Affect (typically observed): Accepting Orientation: : Oriented to Self, Oriented to Place, Oriented to  Time, Oriented to Situation Alcohol / Substance Use: Not Applicable Psych Involvement: No (comment)  Admission diagnosis:  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] Cecal volvulus (HCC) [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.2] Right lower quadrant abdominal pain [R10.31] S/P exploratory laparotomy [Z98.890] 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   Allergic rhinitis 10/03/2019   Cough 10/03/2019   Menopausal syndrome 10/03/2019   Milia 10/03/2019   Neoplasm of uncertain behavior of skin 10/03/2019   Senile hyperkeratosis 01/10/2019   Anxiety 06/17/2017   Bilateral leg edema 06/17/2017   Other hemorrhoids 06/17/2017   Hyperlipidemia 02/12/2017   Symptomatic menopausal or female climacteric states 04/14/2012   Onychomycosis due to dermatophyte 04/14/2012   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   Enthesopathy 02/09/2012   Other lymphedema 07/07/2011   PCP:  Pearline Cables, MD Pharmacy:   [id number] - [place], [place] - [address] [place] [address] Phone: [phone] Fax: [phone]     Social Determinants of Health (SDOH) Social History: SDOH Screenings   Food Insecurity: No Food Insecurity (10/07/2023)  Housing: Patient Declined (10/07/2023)  Transportation Needs: No Transportation Needs (10/07/2023)  Utilities: Not At Risk (10/07/2023)  Depression  (PHQ2-9): Low Risk  (06/17/2022)  Tobacco Use: Low Risk  (10/06/2023)   SDOH Interventions:     Readmission Risk Interventions     No data to display

## 2023-10-08 NOTE — Progress Notes (Signed)
  Progress Note   Patient: Danielle Fitzpatrick KGM:[id number] DOB: [birth year] DOA: 10/06/2023     1 DOS: the patient was seen and examined on 10/08/2023   Brief hospital course: 72 year old woman on Wellbutrin for depression and Aldactone for lower extremity edema who presented with abdominal pain.  CT abdomen pelvis showed volvulus.  She was taken urgently to the OR for exploratory laparotomy and detorsion of the colon.    Consultants General surgery   Procedures 11/5 exploratory laparotomy  Assessment and Plan: Colonic volvulus status post exploratory laparotomy Management per general surgery.  NG tube is out.  Diet advanced to clears.   Mild acute hyponatremia--resolved Treated with IV fluids   Depression Continue Wellbutrin     Chronic lower extremity edema None presently.  Hold Aldactone.    Subjective:  Feels better NG tube out Ambulating Tolerating clear liquids  Physical Exam: Vitals:   10/07/23 2031 10/08/23 0424 10/08/23 0425 10/08/23 0937  BP: 121/68 (!) 142/81  138/64  Pulse: 81 82  84  Resp: (!) 22 18  19   Temp: 97.9 F (36.6 C) 98.4 F (36.9 C)  98 F (36.7 C)  TempSrc: Oral Oral  Oral  SpO2: 95% 95%  98%  Weight:   67.2 kg   Height:       Physical Exam Vitals reviewed.  Constitutional:      General: She is not in acute distress.    Appearance: She is not ill-appearing or toxic-appearing.  Cardiovascular:     Rate and Rhythm: Normal rate and regular rhythm.     Heart sounds: No murmur heard. Pulmonary:     Effort: Pulmonary effort is normal. No respiratory distress.     Breath sounds: No wheezing, rhonchi or rales.  Abdominal:     Palpations: Abdomen is soft.  Neurological:     Mental Status: She is alert.  Psychiatric:        Mood and Affect: Mood normal.        Behavior: Behavior normal.     Data Reviewed: BMP noted WBC 18.4 > 10.6 Hgb 11.4  Family Communication: daughter at bedside  Disposition: Status is: Inpatient Remains  inpatient appropriate because: s/p surgery     Time spent: 20 minutes  Author: Brendia Sacks, MD 10/08/2023 10:51 AM  For on call review [email or web address].

## 2023-10-09 ENCOUNTER — Telehealth: Payer: Self-pay | Admitting: Family Medicine

## 2023-10-09 DIAGNOSIS — K562 Volvulus: Secondary | ICD-10-CM | POA: Diagnosis not present

## 2023-10-09 DIAGNOSIS — Z9889 Other specified postprocedural states: Secondary | ICD-10-CM | POA: Diagnosis not present

## 2023-10-09 MED ORDER — SENNOSIDES-DOCUSATE SODIUM 8.6-50 MG PO TABS
1.0000 | ORAL_TABLET | Freq: Once | ORAL | Status: AC
  Administered 2023-10-09: 1 via ORAL
  Filled 2023-10-09: qty 1

## 2023-10-09 MED ORDER — PSYLLIUM 95 % PO PACK
1.0000 | PACK | Freq: Every day | ORAL | Status: DC
  Administered 2023-10-09: 1 via ORAL
  Filled 2023-10-09: qty 1

## 2023-10-09 MED ORDER — GLYCERIN (LAXATIVE) 2 G RE SUPP
1.0000 | Freq: Once | RECTAL | Status: AC
  Administered 2023-10-09: 1 via RECTAL
  Filled 2023-10-09: qty 1

## 2023-10-09 NOTE — Progress Notes (Signed)
I have reviewed and concur with this student's documentation.   Reva Bores, RN 10/09/2023 4:45 PM

## 2023-10-09 NOTE — Progress Notes (Signed)
  Progress Note   Patient: Danielle Fitzpatrick WUJ:[id number] DOB: [birth year] DOA: 10/06/2023     2 DOS: the patient was seen and examined on 10/09/2023   Brief hospital course: 72 year old woman on Wellbutrin for depression and Aldactone for lower extremity edema who presented with abdominal pain.  CT abdomen pelvis showed volvulus.  She was taken urgently to the OR for exploratory laparotomy and detorsion of the colon.    Consultants General surgery   Procedures 11/5 exploratory laparotomy   Assessment and Plan: Colonic volvulus status post exploratory laparotomy Management per general surgery.  Progressing nicely towards discharge.  Diet advanced. Home later today if tolerates diet and cleared by surgery.   Mild acute hyponatremia--resolved Treated with IV fluids   Depression Continue Wellbutrin     Chronic lower extremity edema None presently.  Hold Aldactone.      Subjective:  Feels better No pain Ambulating Ready for full diet  Physical Exam: Vitals:   10/08/23 2008 10/09/23 0344 10/09/23 0345 10/09/23 0741  BP: 132/74 129/68  124/73  Pulse: 85 77  84  Resp: 16 20  16   Temp: 98.6 F (37 C) 98.3 F (36.8 C)  98.6 F (37 C)  TempSrc: Oral Oral  Oral  SpO2: 95% 95%  96%  Weight:   65.7 kg   Height:       Physical Exam Vitals reviewed.  Constitutional:      General: She is not in acute distress.    Appearance: She is not ill-appearing or toxic-appearing.  Cardiovascular:     Rate and Rhythm: Normal rate and regular rhythm.     Heart sounds: No murmur heard. Pulmonary:     Effort: Pulmonary effort is normal. No respiratory distress.     Breath sounds: No wheezing, rhonchi or rales.  Neurological:     Mental Status: She is alert.  Psychiatric:        Mood and Affect: Mood normal.        Behavior: Behavior normal.     Data Reviewed: No new data  Family Communication: son at bedside  Disposition: Status is: Inpatient Remains inpatient appropriate  because: s/p ex lap     Time spent: 20 minutes  Author: Brendia Sacks, MD 10/09/2023 10:47 AM  For on call review [email or web address].

## 2023-10-09 NOTE — Plan of Care (Signed)

## 2023-10-09 NOTE — Progress Notes (Signed)
PT Cancellation Note  Patient Details Name: Arturo Gutermuth MRN: [id number] DOB: 07-10-1951   Cancelled Treatment:    Reason Eval/Treat Not Completed: (P) Patient declined, no reason specified Pt politely defers PT session, she reports no concerns re: bed mobility, transfers or gait without AD. Pt has gait belt for home in room if needed. Pt reports impending DC.   Dorathy Kinsman Usher Hedberg 10/09/2023, 12:36 PM

## 2023-10-09 NOTE — Progress Notes (Signed)
Discharge instructions (including medications) discussed with and copy provided to patient/caregiver. Patient verbalized understanding and dressed herself.

## 2023-10-09 NOTE — Telephone Encounter (Signed)
Danielle Fitzpatrick just wanted to notify us that the pt declined services.

## 2023-10-09 NOTE — Discharge Summary (Signed)
Physician Discharge Summary   Patient: Danielle Fitzpatrick MRN: [id number] DOB: 1951-10-23  Admit date:     10/06/2023  Discharge date: 10/09/23  Discharge Physician: Brendia Sacks   PCP: Pearline Cables, MD   Recommendations at discharge:   Routine postsurgical follow-up  Discharge Diagnoses: Principal Problem:   Volvulus of colon (HCC) Active Problems:   S/P exploratory laparotomy Mild acute hyponatremia Depression Chronic lower extremity edema  Resolved Problems:   * No resolved hospital problems. *  Hospital Course: 72 year old woman on Wellbutrin for depression and Aldactone for lower extremity edema who presented with abdominal pain.  CT abdomen pelvis showed volvulus.  She was taken urgently to the OR for exploratory laparotomy and detorsion of the colon.  Postoperative course was uneventful, diet was advanced and patient was cleared for discharge by general surgery.   Consultants General surgery   Procedures 11/5 exploratory laparotomy  Colonic volvulus status post exploratory laparotomy Management per general surgery.  Progressing nicely towards discharge.  Diet advanced. Home later today if tolerates diet and cleared by surgery.   Mild acute hyponatremia--resolved Treated with IV fluids   Depression Continue Wellbutrin     Chronic lower extremity edema None presently.  Hold Aldactone.  Disposition: Home Diet recommendation:  Regular diet DISCHARGE MEDICATION: Allergies as of 10/09/2023       Reactions   Crestor [rosuvastatin]    Shakes and tremors        Medication List     TAKE these medications    aspirin EC 81 MG tablet Take 81 mg by mouth.   Biotin 40981 MCG Tabs Take by mouth.   buPROPion 150 MG 24 hr tablet Commonly known as: Wellbutrin XL Take 3 tablets (450 mg total) by mouth daily.   cetirizine 10 MG tablet Commonly known as: ZYRTEC Take 10 mg by mouth daily.   Cyanocobalamin 1000 MCG Tbcr Take by mouth.    hydrocortisone 25 MG suppository Commonly known as: ANUSOL-HC Place 1 suppository (25 mg total) rectally 2 (two) times daily.   Magnesium 300 MG Caps Take by mouth.   spironolactone 100 MG tablet Commonly known as: ALDACTONE TAKE 1/2 TABLET BY MOUTH DAILY AS NEEDED FOR SWELLING   TYLENOL 500 MG tablet Generic drug: acetaminophen Take 500 mg by mouth 3 (three) times daily as needed for moderate pain (lower back pain).   valACYclovir 1000 MG tablet Commonly known as: Valtrex Take 2000 mg by mouth, repeat dose in 12 hours. What changed:  how much to take when to take this reasons to take this   Vitamin D 50 MCG (2000 UT) Caps Take by mouth.   VITAMIN-B COMPLEX PO Take by mouth.        Follow-up Information     [facility] Follow up.   Specialty: Home Health Services Contact information: [address] [place] [address] [phone]         [facility], [place]. Go on 10/19/2023.   Specialty: General Surgery Why: Your appointment is 11/18 at 10:30am with nurse for staple removal, Arrive early to check in, fill out paperwork, Designer, fashion/clothing ID and insurance information Contact information: [address] [place] [address] [phone]        Diamantina Monks, MD. Go on 11/12/2023.   Specialty: Surgery Why: Your appointment is 12/12 at 9:10am Arrive 15 minutes early for check in Contact information: [address] [facility] [place] [facility] [place] [address] [phone]  Feels well  Discharge Exam: Filed Weights   10/07/23 0357 10/08/23 0425 10/09/23 0345  Weight: 66.1 kg 67.2 kg 65.7 kg   Physical Exam Vitals reviewed.  Constitutional:      General: She is not in acute distress.    Appearance: She is not ill-appearing or toxic-appearing.  Cardiovascular:     Rate and Rhythm: Normal rate and regular rhythm.     Heart sounds: No  murmur heard. Pulmonary:     Effort: Pulmonary effort is normal. No respiratory distress.     Breath sounds: No wheezing, rhonchi or rales.  Neurological:     Mental Status: She is alert.  Psychiatric:        Mood and Affect: Mood normal.        Behavior: Behavior normal.      Condition at discharge: good  The results of significant diagnostics from this hospitalization (including imaging, microbiology, ancillary and laboratory) are listed below for reference.   Imaging Studies: CT ABDOMEN PELVIS W CONTRAST  Result Date: 10/06/2023 CLINICAL DATA:  Right lower quadrant abdominal pain EXAM: CT ABDOMEN AND PELVIS WITH CONTRAST TECHNIQUE: Multidetector CT imaging of the abdomen and pelvis was performed using the standard protocol following bolus administration of intravenous contrast. RADIATION DOSE REDUCTION: This exam was performed according to the departmental dose-optimization program which includes automated exposure control, adjustment of the mA and/or kV according to patient size and/or use of iterative reconstruction technique. CONTRAST:  OMNIPAQUE IOHEXOL 300 MG/ML  SOLN COMPARISON:  CT abdomen and pelvis dated 06/18/2022 FINDINGS: Lower chest: No focal consolidation or pulmonary nodule in the lung bases. No pleural effusion or pneumothorax demonstrated. Partially imaged heart size is normal. Hepatobiliary: No focal hepatic lesions. Mild periportal edema. No intra or extrahepatic biliary ductal dilation. Cholelithiasis. Pancreas: No focal lesions or main ductal dilation. Spleen: Normal in size without focal abnormality. Adrenals/Urinary Tract: No adrenal nodules. No suspicious renal mass, calculi or hydronephrosis. No focal bladder wall thickening. Stomach/Bowel: Normal appearance of the stomach. Markedly dilated cecum is inverted into the right upper quadrant with displacement of the hepatic flexure to the right lower quadrant where there is marked luminal narrowing and twisting of  the mesentery (602:72). Colonic diverticulosis without acute diverticulitis. Normal appendix. Vascular/Lymphatic: Aortic atherosclerosis. Retroaortic left renal vein. No enlarged abdominal or pelvic lymph nodes. Reproductive: No adnexal masses. Other: Trace free fluid. No free air or fluid collection. Punctate hypodensities along the anterior margin of the liver (301:24) are favored to represent peritoneal fat. Musculoskeletal: No acute or abnormal lytic or blastic osseous lesions. Multilevel degenerative changes of the partially imaged thoracic and lumbar spine. Unchanged grade 1 anterolisthesis at L4-5. IMPRESSION: 1. Markedly dilated cecum is inverted into the right upper quadrant with displacement of the hepatic flexure to the right lower quadrant where there is marked luminal narrowing and twisting of the mesentery, consistent with cecal volvulus. 2. Normal appendix. 3. Cholelithiasis. 4.  Aortic Atherosclerosis (ICD10-I70.0). These results were called by telephone at the time of interpretation on 10/06/2023 at 7:33 pm to provider Lynden Oxford, who verbally acknowledged these results. Electronically Signed   By: Agustin Cree M.D.   On: 10/06/2023 19:35    Microbiology: Results for orders placed or performed in visit on 09/07/23  Urine Culture     Status: None   Collection Time: 09/07/23  1:41 PM   Specimen: Urine  Result Value Ref Range Status   MICRO NUMBER: [id number]  Final   SPECIMEN QUALITY: Adequate  Final  Sample Source URINE  Final   STATUS: FINAL  Final   Result:   Final    Less than 10,000 CFU/mL of single Gram negative organism isolated. No further testing will be performed. If clinically indicated, recollection using a method to minimize contamination, with prompt transfer to Urine Culture Transport Tube, is recommended.    Labs: CBC: Recent Labs  Lab 10/06/23 1509 10/07/23 0604 10/08/23 0825  WBC 11.5* 18.4* 10.6*  NEUTROABS  --  16.9*  --   HGB 12.4 11.1* 11.4*  HCT  36.7 32.4* 33.4*  MCV 93.6 94.7 94.6  PLT 447* 371 386   Basic Metabolic Panel: Recent Labs  Lab 10/06/23 1509 10/07/23 0604 10/08/23 0825  NA 131* 131* 136  K 3.7 4.2 3.7  CL 94* 102 102  CO2 26 22 23   GLUCOSE 123* 136* 82  BUN 13 7* 8  CREATININE 0.79 0.57 0.57  CALCIUM 9.1 8.0* 8.3*  MG  --  1.9  --    Liver Function Tests: Recent Labs  Lab 10/06/23 1509 10/07/23 0604  AST 23 20  ALT 28 24  ALKPHOS 78 66  BILITOT 0.7 0.6  PROT 6.6 5.2*  ALBUMIN 4.0 3.0*   CBG: No results for input(s): "GLUCAP" in the last 168 hours.  Discharge time spent: less than 30 minutes.  Signed: Brendia Sacks, MD [facility] 10/09/2023

## 2023-10-09 NOTE — Progress Notes (Signed)
Patient ID: Danielle Fitzpatrick, female   DOB: [birth year], 72 y.o.   MRN: [id number] [facility] Surgery Progress Note  3 Days Post-Op  Subjective: CC-  Sitting up eating breakfast. Wants to go home. Tolerating soft diet without n/v. Passing flatus, no BM. Pain well controlled with tylenol. Uses glycerin suppository and Metamucil daily at home for bowel regimen.  Objective: Vital signs in last 24 hours: Temp:  [98 F (36.7 C)-98.6 F (37 C)] 98.6 F (37 C) (11/08 0741) Pulse Rate:  [77-85] 84 (11/08 0741) Resp:  [16-20] 16 (11/08 0741) BP: (124-138)/(64-74) 124/73 (11/08 0741) SpO2:  [95 %-98 %] 96 % (11/08 0741) Weight:  [65.7 kg] 65.7 kg (11/08 0345) Last BM Date : 10/06/23  Intake/Output from previous day: 11/07 0701 - 11/08 0700 In: 750 [P.O.:750] Out: -  Intake/Output this shift: No intake/output data recorded.  PE: Gen:  Alert, NAD, pleasant Pulm:  rate and effort normal on room air Abd: Soft, nondistended, nontender to palpation, midline incision cdi with staples present and mild surrounding ecchymosis but no erythema or drainage  Lab Results:  Recent Labs    10/07/23 0604 10/08/23 0825  WBC 18.4* 10.6*  HGB 11.1* 11.4*  HCT 32.4* 33.4*  PLT 371 386   BMET Recent Labs    10/07/23 0604 10/08/23 0825  NA 131* 136  K 4.2 3.7  CL 102 102  CO2 22 23  GLUCOSE 136* 82  BUN 7* 8  CREATININE 0.57 0.57  CALCIUM 8.0* 8.3*   PT/INR No results for input(s): "LABPROT", "INR" in the last 72 hours. CMP     Component Value Date/Time   NA 136 10/08/2023 0825   NA 141 02/21/2017 0000   K 3.7 10/08/2023 0825   CL 102 10/08/2023 0825   CO2 23 10/08/2023 0825   GLUCOSE 82 10/08/2023 0825   BUN 8 10/08/2023 0825   BUN 12 02/21/2017 0000   CREATININE 0.57 10/08/2023 0825   CREATININE 0.62 09/13/2020 1003   CALCIUM 8.3 (L) 10/08/2023 0825   PROT 5.2 (L) 10/07/2023 0604   ALBUMIN 3.0 (L) 10/07/2023 0604   AST 20 10/07/2023 0604   ALT 24 10/07/2023 0604    ALKPHOS 66 10/07/2023 0604   BILITOT 0.6 10/07/2023 0604   GFRNONAA >60 10/08/2023 0825   Lipase     Component Value Date/Time   LIPASE 48 10/06/2023 1509       Studies/Results: No results found.  Anti-infectives: Anti-infectives (From admission, onward)    None        Assessment/Plan Cecal volvulus   POD#3 s/p exploratory laparotomy 11/5 Dr. Bedelia Person - intra-op: all bowel appeared to be viable, the colon was detorsed - Advance to regular diet. Add home bowel regimen as above. Possible discharge later today - mobilize, PT has seen with recs for [facility] PT - continue tylenol  ID - none FEN - reg diet VTE - SCDs, lovenox Foley - none   HLD Depression Chronic LE edema   LOS: 2 days    Franne Forts, [facility] 10/09/2023, 8:31 AM Please see Amion for pager number during day hours 7:00am-4:30pm

## 2023-10-12 ENCOUNTER — Encounter: Payer: Self-pay | Admitting: Family Medicine

## 2023-10-12 ENCOUNTER — Telehealth: Payer: Self-pay

## 2023-10-12 NOTE — Transitions of Care (Post Inpatient/ED Visit) (Unsigned)
 10/12/2023  Name: Danielle Fitzpatrick MRN: [id number] DOB: Jun 15, 1951  Today's TOC FU Call Status: Today's TOC FU Call Status:: Successful TOC FU Call Completed TOC FU Call Complete Date: 10/12/23 Patient's Name and Date of Birth confirmed.  Transition Care Management Follow-up Telephone Call Date of Discharge: 10/09/23 Discharge Facility: [facility]) Type of Discharge: Inpatient Admission Primary Inpatient Discharge Diagnosis:: volvulus How have you been since you were released from the hospital?: Better Any questions or concerns?: No  Items Reviewed: Did you receive and understand the discharge instructions provided?: Yes Medications obtained,verified, and reconciled?: Yes (Medications Reviewed) Any new allergies since your discharge?: No Dietary orders reviewed?: Yes Type of Diet Ordered:: regular diet Do you have support at home?: Yes People in Home: child(ren), adult  Medications Reviewed Today: Medications Reviewed Today     Reviewed by Blanch Stang, Jordan Hawks, CMA (Certified Medical Assistant) on 10/12/23 at 1326  Med List Status: <None>   Medication Order Taking? Sig Documenting Provider Last Dose Status Informant  acetaminophen (TYLENOL) 500 MG tablet [id number] No Take 500 mg by mouth 3 (three) times daily as needed for moderate pain (lower back pain). [provider] 10/06/2023 Active Self, Pharmacy Records  aspirin EC 81 MG tablet [id number] No Take 81 mg by mouth. [provider] 10/06/2023 Active Self, Pharmacy Records  B Complex Vitamins (VITAMIN-B COMPLEX PO) [id number] No Take by mouth. [provider] 10/06/2023 Active Self, Pharmacy Records  Biotin 40347 MCG TABS [id number] No Take by mouth. [provider] 10/06/2023 Active Self, Pharmacy Records  buPROPion (WELLBUTRIN XL) 150 MG 24 hr tablet [id number] No Take 3 tablets (450 mg total) by mouth daily. Copland, Gwenlyn Found, MD 10/06/2023 Active Self, Pharmacy Records  cetirizine (ZYRTEC)  10 MG tablet [id number] No Take 10 mg by mouth daily. [provider] 10/06/2023 Active Self, Pharmacy Records  Cholecalciferol (VITAMIN D) 2000 units CAPS [id number] No Take by mouth. [provider] 10/06/2023 Active Self, Pharmacy Records  Cyanocobalamin 1000 MCG TBCR [id number] No Take by mouth. [provider] 10/06/2023 Active Self, Pharmacy Records  hydrocortisone (ANUSOL-HC) 25 MG suppository [id number] No Place 1 suppository (25 mg total) rectally 2 (two) times daily. Sherrilyn Rist, MD Lajoyce Lauber Self, Pharmacy Records           Med Note Nedra Hai, NICOLE   Tue Oct 06, 2023  9:44 PM) Pietro Cassis this medication on hand  Magnesium 300 MG CAPS [id number] No Take by mouth. [provider] 10/06/2023 Active Self, Pharmacy Records  spironolactone (ALDACTONE) 100 MG tablet [id number] No TAKE 1/2 TABLET BY MOUTH DAILY AS NEEDED FOR SWELLING Copland, Gwenlyn Found, MD 10/05/2023 Active Self, Pharmacy Records  valACYclovir (VALTREX) 1000 MG tablet [id number] No Take 2000 mg by mouth, repeat dose in 12 hours.  Patient taking differently: 1,000 mg every 12 (twelve) hours as needed. Take 2000 mg by mouth, repeat dose in 12 hours.   Copland, Gwenlyn Found, MD Lajoyce Lauber Self, Pharmacy Records           Med Note Nedra Hai, NICOLE   Tue Oct 06, 2023  9:32 PM) Only takes as needed            Home Care and Equipment/Supplies: Were Home Health Services Ordered?: Yes Name of Home Health Agency:: [facility]-patient declined services Has Agency set up a time to come to your home?: No Any new equipment or medical supplies ordered?: NA  Functional Questionnaire: Do you need assistance with bathing/showering or dressing?: No Do you need assistance  with meal preparation?: No Do you need assistance with eating?: No Do you have difficulty maintaining continence: No Do you need assistance with getting out of bed/getting out of a chair/moving?: No Do you have difficulty managing or taking your  medications?: No  Follow up appointments reviewed: PCP Follow-up appointment confirmed?: NA Specialist Hospital Follow-up appointment confirmed?: Yes Date of Specialist follow-up appointment?: 10/19/23 Follow-Up Specialty Provider:: [facility] Do you need transportation to your follow-up appointment?: No Do you understand care options if your condition(s) worsen?: Yes-patient verbalized understanding     Marykay Mccleod, CMA  [facility] AWV Team Direct Dial: [phone]

## 2023-10-21 ENCOUNTER — Other Ambulatory Visit: Payer: Self-pay | Admitting: Gastroenterology

## 2023-10-21 MED ORDER — HYDROCORTISONE ACETATE 25 MG RE SUPP: 25.0000 mg | Freq: Two times a day (BID) | RECTAL | 0 refills | Status: DC

## 2023-11-10 ENCOUNTER — Encounter (INDEPENDENT_AMBULATORY_CARE_PROVIDER_SITE_OTHER): Payer: Medicare Other | Admitting: Family Medicine

## 2023-11-10 ENCOUNTER — Other Ambulatory Visit: Payer: Self-pay

## 2023-11-10 DIAGNOSIS — R300 Dysuria: Secondary | ICD-10-CM

## 2023-11-10 DIAGNOSIS — R350 Frequency of micturition: Secondary | ICD-10-CM

## 2023-11-10 MED ORDER — CEPHALEXIN 500 MG PO CAPS: 500.0000 mg | ORAL_CAPSULE | Freq: Two times a day (BID) | ORAL | 0 refills | Status: DC

## 2023-11-10 NOTE — Telephone Encounter (Signed)

## 2023-11-10 NOTE — Telephone Encounter (Signed)
Pt called to speak with Melton Alar, stated she needed pcp to call in a medication. Advised pt of Tierra's mychart message stating she would need to come in for a lab appt. She stated that was absolutely ridiculous and she has never had to do that and that is not what pcp would want. Pt seemed very frustrated and did not want to hear an explanation as to why she needed to come in. Marylene Land made aware and spoke with pt. She refused an appt with another provider today and refused a lab appt. She is aware pcp is not in today and requested a message sent to se if pcp will call in rx.

## 2023-11-11 ENCOUNTER — Other Ambulatory Visit: Payer: Medicare Other

## 2023-11-11 DIAGNOSIS — R300 Dysuria: Secondary | ICD-10-CM

## 2023-11-11 NOTE — Progress Notes (Signed)
Pt provided urine sample

## 2023-11-12 LAB — URINE CULTURE
MICRO NUMBER:: 15837429
SPECIMEN QUALITY:: ADEQUATE

## 2023-11-13 ENCOUNTER — Encounter: Payer: Self-pay | Admitting: Family Medicine

## 2023-11-13 NOTE — Telephone Encounter (Signed)
Received urine culture, it is actually negative.  Sent message to patient  Component Ref Range & Units (hover) 2 d ago  MICRO NUMBER: [id number]  SPECIMEN QUALITY: Adequate  Sample Source NOT GIVEN  STATUS: FINAL  Result: Less than 10,000 CFU/mL of single Gram negative organism isolated. No further testing will be performed. If clinically indicated, recollection using a method to minimize contamination, with prompt transfer to Urine Culture Transport Tube, is recommended.  Resulting Agency [facility] [place]

## 2023-11-17 NOTE — Progress Notes (Deleted)
[facility] [address] [place], [place] [address] [phone]  Date:  11/18/2023   Name:  Danielle Fitzpatrick   DOB:  10/08/1951   MRN:  [id number]  PCP:  Pearline Cables, MD    Chief Complaint: No chief complaint on file.   History of Present Illness:  Danielle Fitzpatrick is a 72 y.o. very pleasant female patient who presents with the following:  Today with concern of a "growth"-history of hypertension, allergies, anxiety and depression I saw her most recently in April, although we have exchanged several messages since that time most recently about UTI symptoms  She was admitted in November with a cecal volvulus-inpatient from 11/5 through 11/8, she had an exploratory laparotomy and detorsion of her colon   Flu shot Colon cancer screening-she had a colonoscopy last year Recommend Tdap Recommend Shingrix  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   Allergic rhinitis 10/03/2019   Cough 10/03/2019   Menopausal syndrome 10/03/2019   Milia 10/03/2019   Neoplasm of uncertain behavior of skin 10/03/2019   Senile hyperkeratosis 01/10/2019   Anxiety 06/17/2017   Bilateral leg edema 06/17/2017   Other hemorrhoids 06/17/2017   Hyperlipidemia 02/12/2017   Symptomatic menopausal or female climacteric states 04/14/2012   Onychomycosis due to dermatophyte 04/14/2012   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   Enthesopathy 02/09/2012   Other lymphedema 07/07/2011    Past Medical History:  Diagnosis Date   Allergy    Anxiety    Arthritis    Chicken pox    Depression    Diverticulitis    Diverticulosis    Hyperlipidemia     Past Surgical History:  Procedure Laterality Date   AUGMENTATION MAMMAPLASTY     LAPAROTOMY N/A 10/06/2023   Procedure: EXPLORATORY LAPAROTOMY;  Surgeon: Diamantina Monks, MD;  Location: [facility] OR;   Service: General;  Laterality: N/A;    Social History   Tobacco Use   Smoking status: Never   Smokeless tobacco: Never  Vaping Use   Vaping status: Never Used  Substance Use Topics   Alcohol use: Yes    Comment: occasionally   Drug use: No    Family History  Problem Relation Age of Onset   Depression Mother    Hyperlipidemia Mother    Hypertension Mother    Hypertension Father    Hyperlipidemia Father    Heart disease Father    Hearing loss Father    Diabetes Father    Depression Father    Rheum arthritis Father    Depression Sister    Alcohol abuse Brother    Depression Brother    Early death Brother    Heart attack Brother    Hypertension Brother    Hearing loss Maternal Grandmother    Diabetes Maternal Grandfather    Early death Maternal Grandfather    Heart disease Maternal Grandfather    Alcohol abuse Paternal Grandmother    Early death Paternal Grandmother    Hyperlipidemia Daughter    Anxiety disorder Son    Wilson's disease Neg Hx    Colon cancer Neg Hx    Stomach cancer Neg Hx    Esophageal cancer Neg Hx    Rectal cancer Neg Hx     Allergies  Allergen Reactions   Crestor [Rosuvastatin]     Shakes and tremors    Medication  list has been reviewed and updated.  Current Outpatient Medications on File Prior to Visit  Medication Sig Dispense Refill   acetaminophen (TYLENOL) 500 MG tablet Take 500 mg by mouth 3 (three) times daily as needed for moderate pain (lower back pain).     aspirin EC 81 MG tablet Take 81 mg by mouth.     B Complex Vitamins (VITAMIN-B COMPLEX PO) Take by mouth.     Biotin 32440 MCG TABS Take by mouth.     buPROPion (WELLBUTRIN XL) 150 MG 24 hr tablet Take 3 tablets (450 mg total) by mouth daily. 270 tablet 3   cephALEXin (KEFLEX) 500 MG capsule Take 1 capsule (500 mg total) by mouth 2 (two) times daily. 14 capsule 0   cetirizine (ZYRTEC) 10 MG tablet Take 10 mg by mouth daily.     Cholecalciferol (VITAMIN D) 2000 units CAPS  Take by mouth.     Cyanocobalamin 1000 MCG TBCR Take by mouth.     hydrocortisone (ANUSOL-HC) 25 MG suppository Place 1 suppository (25 mg total) rectally 2 (two) times daily. 12 suppository 0   Magnesium 300 MG CAPS Take by mouth.     spironolactone (ALDACTONE) 100 MG tablet TAKE 1/2 TABLET BY MOUTH DAILY AS NEEDED FOR SWELLING 45 tablet 3   valACYclovir (VALTREX) 1000 MG tablet Take 2000 mg by mouth, repeat dose in 12 hours. (Patient taking differently: 1,000 mg every 12 (twelve) hours as needed. Take 2000 mg by mouth, repeat dose in 12 hours.) 20 tablet 0   No current facility-administered medications on file prior to visit.    Review of Systems:  As per HPI- otherwise negative.   Physical Examination: There were no vitals filed for this visit. There were no vitals filed for this visit. There is no height or weight on file to calculate BMI. Ideal Body Weight:    GEN: no acute distress. HEENT: Atraumatic, Normocephalic.  Ears and Nose: No external deformity. CV: RRR, No M/G/R. No JVD. No thrill. No extra heart sounds. PULM: CTA B, no wheezes, crackles, rhonchi. No retractions. No resp. distress. No accessory muscle use. ABD: S, NT, ND, +BS. No rebound. No HSM. EXTR: No c/c/e PSYCH: Normally interactive. Conversant.    Assessment and Plan: ***  Signed Abbe Amsterdam, MD

## 2023-11-18 ENCOUNTER — Ambulatory Visit: Payer: Medicare Other | Admitting: Family Medicine

## 2023-12-07 ENCOUNTER — Encounter: Payer: Self-pay | Admitting: Family Medicine

## 2023-12-13 NOTE — Progress Notes (Addendum)
 [facility] [address], [address] [place], [place] [address] [phone]  Date:  12/14/2023   Name:  Danielle Fitzpatrick   DOB:  [birth year]   MRN:  [id number]  PCP:  Watt Harlene BROCKS, MD    Chief Complaint: Pre-op Exam (Concerns/ questions: back pain)   History of Present Illness:  Shawnya Mayor is a 73 y.o. very pleasant female patient who presents with the following:  Patient seen today for preoperative visit-she plans to have knee surgery per Dr. Kay- they plan to do a right TKA Most recent visit with myself was in April 2024 History of hyperlipidemia  Shingrix- done  Cologuard -patient had a colonoscopy 2023-1 large, 10 mm polyp removed.  Plan for 3-year recheck due to size of polyp Tetanus- recommneded  Flu vaccine- done  Covid UTD  Some lab work done in November EKG 10/2023-normal Stress test 2019  She is doing water aerobics, just started back pilates No CP or SOB with water aerobics or walking her dog.  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   Enthesopathy 02/09/2012   Other lymphedema 07/07/2011    Past Medical History:  Diagnosis Date   Allergy    Anxiety    Arthritis    Chicken pox    Depression    Diverticulitis    Diverticulosis    Hyperlipidemia     Past Surgical History:   Procedure Laterality Date   AUGMENTATION MAMMAPLASTY     LAPAROTOMY N/A 10/06/2023   Procedure: EXPLORATORY LAPAROTOMY;  Surgeon: Paola Dreama SAILOR, MD;  Location: [facility] OR;  Service: General;  Laterality: N/A;    Social History   Tobacco Use   Smoking status: Never   Smokeless tobacco: Never  Vaping Use   Vaping status: Never Used  Substance Use Topics   Alcohol use: Yes    Comment: occasionally   Drug use: No    Family History  Problem Relation Age of Onset   Depression Mother    Hyperlipidemia Mother    Hypertension Mother    Hypertension Father    Hyperlipidemia Father    Heart disease Father    Hearing loss Father    Diabetes Father    Depression Father    Rheum arthritis Father    Depression Sister    Alcohol abuse Brother    Depression Brother    Early death Brother    Heart attack Brother    Hypertension Brother    Hearing loss Maternal Grandmother    Diabetes Maternal Grandfather    Early death Maternal Grandfather    Heart disease Maternal Grandfather    Alcohol abuse Paternal Grandmother    Early death Paternal Grandmother    Hyperlipidemia Daughter    Anxiety disorder Son    Wilson's disease Neg Hx    Colon cancer Neg Hx  Stomach cancer Neg Hx    Esophageal cancer Neg Hx    Rectal cancer Neg Hx     Allergies  Allergen Reactions   Crestor  [Rosuvastatin ]     Shakes and tremors    Medication list has been reviewed and updated.  Current Outpatient Medications on File Prior to Visit  Medication Sig Dispense Refill   acetaminophen  (TYLENOL ) 500 MG tablet Take 500 mg by mouth 3 (three) times daily as needed for moderate pain (lower back pain).     aspirin EC 81 MG tablet Take 81 mg by mouth.     B Complex Vitamins (VITAMIN-B COMPLEX PO) Take by mouth.     Biotin 89999 MCG TABS Take by mouth.     buPROPion  (WELLBUTRIN  XL) 150 MG 24 hr tablet Take 3 tablets (450 mg total) by mouth daily. 270 tablet 3   cetirizine (ZYRTEC) 10 MG tablet Take 10 mg by  mouth daily.     Cholecalciferol (VITAMIN D ) 2000 units CAPS Take by mouth.     Cyanocobalamin  1000 MCG TBCR Take by mouth.     hydrocortisone  (ANUSOL -HC) 25 MG suppository Place 1 suppository (25 mg total) rectally 2 (two) times daily. 12 suppository 0   Magnesium 300 MG CAPS Take by mouth.     spironolactone  (ALDACTONE ) 100 MG tablet TAKE 1/2 TABLET BY MOUTH DAILY AS NEEDED FOR SWELLING 45 tablet 3   valACYclovir  (VALTREX ) 1000 MG tablet Take 2000 mg by mouth, repeat dose in 12 hours. (Patient taking differently: 1,000 mg every 12 (twelve) hours as needed. Take 2000 mg by mouth, repeat dose in 12 hours.) 20 tablet 0   No current facility-administered medications on file prior to visit.    Review of Systems:  As per HPI- otherwise negative.   Physical Examination: Vitals:   12/14/23 1305  BP: 122/60  Pulse: 92  Resp: 18  Temp: 98.2 F (36.8 C)  SpO2: 94%   Vitals:   12/14/23 1305  Weight: 132 lb (59.9 kg)  Height: 5' 2 (1.575 m)   Body mass index is 24.14 kg/m. Ideal Body Weight: Weight in (lb) to have BMI = 25: 136.4  GEN: no acute distress. Normal weight, looks well  HEENT: Atraumatic, Normocephalic.  Ears and Nose: No external deformity. CV: RRR, No M/G/R. No JVD. No thrill. No extra heart sounds. PULM: CTA B, no wheezes, crackles, rhonchi. No retractions. No resp. distress. No accessory muscle use. ABD: S, NT, ND, +BS. No rebound. No HSM. EXTR: No c/c/e PSYCH: Normally interactive. Conversant.    Assessment and Plan: Preoperative clearance - Plan: CBC, Basic metabolic panel, Hemoglobin A1c, DG Chest 2 View  Elevated glucose - Plan: Hemoglobin A1c  I will obtain labs and chest film for Dr Kay and assuming ok plan for surgery Signed Harlene Schroeder, MD  Received results 1/14- message to pt and will clear for surgery  Results for orders placed or performed in visit on 12/14/23  CBC   Collection Time: 12/14/23  1:31 PM  Result Value Ref Range   WBC 6.9  4.0 - 10.5 K/uL   RBC 4.09 3.87 - 5.11 Mil/uL   Platelets 370.0 150.0 - 400.0 K/uL   Hemoglobin 13.3 12.0 - 15.0 g/dL   HCT 59.7 63.9 - 53.9 %   MCV 98.3 78.0 - 100.0 fl   MCHC 32.9 30.0 - 36.0 g/dL   RDW 87.2 88.4 - 84.4 %  Basic metabolic panel   Collection Time: 12/14/23  1:31 PM  Result Value Ref  Range   Sodium 140 135 - 145 mEq/L   Potassium 4.1 3.5 - 5.1 mEq/L   Chloride 102 96 - 112 mEq/L   CO2 29 19 - 32 mEq/L   Glucose, Bld 107 (H) 70 - 99 mg/dL   BUN 9 6 - 23 mg/dL   Creatinine, Ser 9.35 0.40 - 1.20 mg/dL   GFR 10.83 >39.99 mL/min   Calcium  9.7 8.4 - 10.5 mg/dL  Hemoglobin J8r   Collection Time: 12/14/23  1:31 PM  Result Value Ref Range   Hgb A1c MFr Bld 5.5 4.6 - 6.5 %    DG Chest 2 View Result Date: 12/14/2023 CLINICAL DATA:  Preop for knee surgery. EXAM: CHEST - 2 VIEW COMPARISON:  None Available. FINDINGS: Normal heart size.The cardiomediastinal contours are normal. The lungs are clear. Pulmonary vasculature is normal. No consolidation, pleural effusion, or pneumothorax. No acute osseous abnormalities are seen. IMPRESSION: No active cardiopulmonary disease. Electronically Signed   By: Andrea Gasman M.D.   On: 12/14/2023 15:14

## 2023-12-13 NOTE — Patient Instructions (Addendum)
 It was great to see you again today Best of luck with your upcoming knee surgery- assuming all results are normal I will let Dr Kay know ok to go ahead  Recommend shingles vaccine series, tetanus booster, COVID booster if not up-to-date at your pharmacy

## 2023-12-14 ENCOUNTER — Encounter: Payer: Self-pay | Admitting: Family Medicine

## 2023-12-14 ENCOUNTER — Ambulatory Visit (INDEPENDENT_AMBULATORY_CARE_PROVIDER_SITE_OTHER): Payer: Medicare Other | Admitting: Family Medicine

## 2023-12-14 ENCOUNTER — Ambulatory Visit (HOSPITAL_BASED_OUTPATIENT_CLINIC_OR_DEPARTMENT_OTHER)
Admission: RE | Admit: 2023-12-14 | Discharge: 2023-12-14 | Disposition: A | Discharge status: Home / Self Care | Payer: Medicare Other | Source: Ambulatory Visit | Attending: Family Medicine | Admitting: Family Medicine

## 2023-12-14 VITALS — BP 122/60 | HR 92 | Temp 98.2°F | Resp 18 | Ht 62.0 in | Wt 132.0 lb

## 2023-12-14 DIAGNOSIS — R7309 Other abnormal glucose: Secondary | ICD-10-CM

## 2023-12-14 DIAGNOSIS — Z01818 Encounter for other preprocedural examination: Secondary | ICD-10-CM

## 2023-12-14 LAB — CBC
HCT: 40.2 % (ref 36.0–46.0)
Hemoglobin: 13.3 g/dL (ref 12.0–15.0)
MCHC: 32.9 g/dL (ref 30.0–36.0)
MCV: 98.3 fL (ref 78.0–100.0)
Platelets: 370 10*3/uL (ref 150.0–400.0)
RBC: 4.09 Mil/uL (ref 3.87–5.11)
RDW: 12.7 % (ref 11.5–15.5)
WBC: 6.9 10*3/uL (ref 4.0–10.5)

## 2023-12-14 LAB — BASIC METABOLIC PANEL
BUN: 9 mg/dL (ref 6–23)
CO2: 29 meq/L (ref 19–32)
Calcium: 9.7 mg/dL (ref 8.4–10.5)
Chloride: 102 meq/L (ref 96–112)
Creatinine, Ser: 0.64 mg/dL (ref 0.40–1.20)
GFR: 89.16 mL/min (ref >60.00)
Glucose, Bld: 107 mg/dL — ABNORMAL HIGH (ref 70–99)
Potassium: 4.1 meq/L (ref 3.5–5.1)
Sodium: 140 meq/L (ref 135–145)

## 2023-12-14 LAB — HEMOGLOBIN A1C: Hgb A1c MFr Bld: 5.5 % (ref 4.6–6.5)

## 2023-12-25 ENCOUNTER — Encounter: Payer: Self-pay | Admitting: Family Medicine

## 2023-12-25 NOTE — Telephone Encounter (Signed)
Pt called- form in S drive. Does she need an appt for clearance?

## 2023-12-25 NOTE — Telephone Encounter (Signed)
This has already been faxed over to the surgical office and sent to scan. I will call to f/u.

## 2024-02-13 ENCOUNTER — Other Ambulatory Visit: Payer: Self-pay | Admitting: Family Medicine

## 2024-02-18 ENCOUNTER — Other Ambulatory Visit: Payer: Self-pay

## 2024-02-18 ENCOUNTER — Ambulatory Visit: Payer: Medicare Other | Attending: Orthopedic Surgery | Admitting: Physical Therapy

## 2024-02-18 ENCOUNTER — Encounter: Payer: Self-pay | Admitting: Physical Therapy

## 2024-02-18 DIAGNOSIS — M25661 Stiffness of right knee, not elsewhere classified: Secondary | ICD-10-CM | POA: Insufficient documentation

## 2024-02-18 DIAGNOSIS — M6281 Muscle weakness (generalized): Secondary | ICD-10-CM | POA: Diagnosis present

## 2024-02-18 DIAGNOSIS — R262 Difficulty in walking, not elsewhere classified: Secondary | ICD-10-CM | POA: Insufficient documentation

## 2024-02-18 DIAGNOSIS — R600 Localized edema: Secondary | ICD-10-CM | POA: Diagnosis present

## 2024-02-18 DIAGNOSIS — M25561 Pain in right knee: Secondary | ICD-10-CM | POA: Insufficient documentation

## 2024-02-18 NOTE — Therapy (Signed)
 OUTPATIENT PHYSICAL THERAPY LOWER EXTREMITY EVALUATION   Patient Name: Danielle Fitzpatrick MRN: [id number] DOB:March 26, 1951, 73 y.o., female Today's Date: 02/18/2024  END OF SESSION:  PT End of Session - 02/18/24 1328     Visit Number 1    Date for PT Re-Evaluation 03/31/24    Authorization Type Medicare + [employer]    Progress Note Due on Visit 10    PT Start Time 1322    PT Stop Time 1405    PT Time Calculation (min) 43 min             Past Medical History:  Diagnosis Date   Allergy    Anxiety    Arthritis    Chicken pox    Depression    Diverticulitis    Diverticulosis    Hyperlipidemia    Past Surgical History:  Procedure Laterality Date   AUGMENTATION MAMMAPLASTY     LAPAROTOMY N/A 10/06/2023   Procedure: EXPLORATORY LAPAROTOMY;  Surgeon: Diamantina Monks, MD;  Location: [facility] OR;  Service: General;  Laterality: N/A;   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 Had R TKA on 02/15/24.  She had a lot of nausea and vomiting for 12 hours after surgery, has started to feel a little better now, has been trying to do knee exercises now.  Blue foam thing is a little bit of torture.    NEXT MD VISIT: 03/01/2024  PERTINENT HISTORY: R knee OA, recent abdominal surgery (10/06/23), degenerative disc disease of lumbar spine, chronic LBP PAIN:  Are you having pain? Yes: NPRS scale: 4/10  Pain location: R knee Pain description: ache, post surgical  Aggravating factors: bending Relieving factors: tylenol, ice  PRECAUTIONS: Knee  RED FLAGS: None   WEIGHT BEARING RESTRICTIONS: No  FALLS:  Has patient fallen in last 6 months? No  LIVING ENVIRONMENT: Lives with: lives alone  adult children is assisting Lives in: House/apartment Stairs: No Has following equipment at home: Single point cane, Environmental consultant - 2 wheeled, and shower chair  OCCUPATION: retired  PLOF: Independent and Leisure: pilates and water aerobics   PATIENT GOALS: return to activities    OBJECTIVE:  Note: Objective measures were completed at Evaluation unless otherwise noted.  DIAGNOSTIC FINDINGS: DG lumbar spine 03/18/23 IMPRESSION: Progressive, severe degenerative disc disease at L4-5 and L5-S1 with unchanged grade 2 anterolisthesis at L4-L5 and moderate-severe facet arthropathy at these levels.   Unchanged moderate degenerative disc disease at  L1-L2 and L2-L3.  PATIENT SURVEYS:  LEFS 11/80  COGNITION: Overall cognitive status: Within functional limits for tasks assessed     SENSATION: WFL  EDEMA:  Circumferential:  knee L 32, R 36 cm thigh L 36 R 46 cm   MUSCLE LENGTH: NA  POSTURE: No Significant postural limitations  PALPATION: Negative squeeze test  LOWER EXTREMITY ROM:  Active ROM Right eval Left eval  Knee flexion 90 130  Knee extension Lacking 11 0    (Blank rows = not tested)  LOWER EXTREMITY MMT:  MMT Right eval Left eval  Hip flexion 3 5  Hip extension    Hip abduction    Hip adduction    Knee flexion 3+ 5  Knee extension 2+ 25 deg knee lag 5  Ankle dorsiflexion 4 5  Ankle plantarflexion     (Blank rows = not tested)   GAIT: Distance walked: 50' Assistive device utilized: Environmental consultant - 2 wheeled Level of assistance: Modified independence Comments: 0.6 m/s, reciprocal step, decreased heel strike on Right.    TODAY'S TREATMENT:                                                                                                                              DATE:   02/18/24 EVAL Self Care: Education on wound care, s/s infection, precautions, importance of mobilization, sleep positioning, pain management, edema management.  Therapeutic Exercise: to improve strength and mobility.  Demo, verbal and tactile cues throughout for technique. Initial HEP - quad sets supine and seated, knee AROM, knee flexion stretch seated.        PATIENT EDUCATION:  Education details: fingings, POC, initial HEP Person educated: Patient Education method: Explanation, Demonstration, Verbal cues, and Handouts Education comprehension: verbalized understanding and returned demonstration  HOME EXERCISE PROGRAM: Access Code: [email or web address] URL: [email or web address] Date: 02/18/2024 Prepared by: Harrie Foreman  Exercises - Seated Knee Flexion Slide  - 3 x daily - 7 x weekly - 1 sets - 10 reps - Seated Heel Slide  - 1 x daily - 7 x weekly - 3 sets - 10 reps - Seated Long Arc Quad  - 3 x daily - 7 x weekly - 1 sets - 10 reps - Supine Quad Set on Towel Roll  - 3 x daily - 7 x weekly - 1 sets - 10 reps - Seated Quad Set  - 3 x daily - 7 x weekly - 1 sets - 10 reps  ASSESSMENT:  CLINICAL IMPRESSION: Patient is a 73 y.o. fmeale who was seen today for physical therapy evaluation and treatment for s/p Right TKR on 02/15/2024.  Exam is typical  and as expected, with significant localized edema, limited knee ROM, poor functional strength, gait and balance impairment, and poor functional activity tolerance. Thomos Lemons will benefit from skilled PT services to address all limitations, reduce pain, and reach optimal level of function.   OBJECTIVE IMPAIRMENTS: Abnormal gait, decreased activity tolerance, decreased balance, decreased  endurance, decreased mobility, difficulty walking, decreased ROM, decreased strength, hypomobility, increased edema, increased fascial restrictions, impaired perceived functional ability, increased muscle spasms, and pain.   ACTIVITY LIMITATIONS: carrying, lifting, bending, sitting, standing, squatting, sleeping, stairs, transfers, bed mobility, dressing, and locomotion level  PARTICIPATION LIMITATIONS: meal prep, cleaning, laundry, driving, shopping, community activity, and yard work  PERSONAL FACTORS: Age, Past/current experiences, Transportation, and 3+ comorbidities: recent abdominal surgery (10/06/23), degenerative disc disease of lumbar spine, chronic LBP  are also affecting patient's functional outcome.   REHAB POTENTIAL: Good  CLINICAL DECISION MAKING: Evolving/moderate complexity  EVALUATION COMPLEXITY: Moderate   GOALS: Goals reviewed with patient? Yes   SHORT TERM GOALS: Target date: 03/03/2024    Independent with initial HEP. Baseline:  Goal status: INITIAL  2.  Patient will have full right knee extension for safety with gait.  Baseline: lacking 10 deg.  Goal status: INITIAL  LONG TERM GOALS: Target date: 03/31/2024   Independent with advanced/ongoing HEP to improve outcomes and carryover.  Baseline:  Goal status: INITIAL  2.  Patient will demonstrate right knee flexion to 120 deg to ascend/descend stairs. Baseline: 90 Goal status: INITIAL  3. Patient will be able to ambulate 600' safely without AD and normal gait pattern to access community.  Baseline: 2WRW Goal status:  INITIAL  4.  Patient will be able to ascend/descend stairs with 1 HR and reciprocal step pattern safely to access home and community.  Baseline: unable  Goal status: INITIAL  5.  Patient will demonstrate > 19/24 on DGI to demonstrate decreased risk of falls.   Baseline: NT Goal status: INITIAL  6.  Patient will demonstrate improved functional LE strength by completing 5x STS in <14 seconds.   Baseline: NT Goal status: INITIAL  7.  Patient will demonstrate 40/80 on LEFS to demonstrate improved mobility.  Baseline: 11/80 Goal status: INITIAL  PLAN:  PT FREQUENCY: 2-3x/week  PT DURATION: 6 weeks  PLANNED INTERVENTIONS: 97110-Therapeutic exercises, 97530- Therapeutic activity, 97112- Neuromuscular re-education, 97535- Self Care, 78295- Manual therapy, [phone]- Gait training, [phone]- Orthotic Fit/training, [phone]- Electrical stimulation (unattended), [phone]- Electrical stimulation (manual), 97016- Vasopneumatic device, Patient/Family education, Balance training, Stair training, Taping, Dry Needling, Joint mobilization, Joint manipulation, Spinal manipulation, Spinal mobilization, Scar mobilization, Cryotherapy, and Moist heat  PLAN FOR NEXT SESSION: review progress supine exercises, add hip strengthening exercises, ROM measurements weekly, progress strength, balance, gait as tolerated.  Modalities PRN.    Jena Gauss, PT, DPT 02/18/2024, 2:48 PM

## 2024-02-20 ENCOUNTER — Other Ambulatory Visit: Payer: Self-pay | Admitting: Family Medicine

## 2024-02-20 DIAGNOSIS — R600 Localized edema: Secondary | ICD-10-CM

## 2024-02-22 ENCOUNTER — Ambulatory Visit: Payer: Medicare Other

## 2024-02-22 DIAGNOSIS — M25661 Stiffness of right knee, not elsewhere classified: Secondary | ICD-10-CM

## 2024-02-22 DIAGNOSIS — M25561 Pain in right knee: Secondary | ICD-10-CM | POA: Diagnosis not present

## 2024-02-22 DIAGNOSIS — R600 Localized edema: Secondary | ICD-10-CM

## 2024-02-22 DIAGNOSIS — M6281 Muscle weakness (generalized): Secondary | ICD-10-CM

## 2024-02-22 DIAGNOSIS — R262 Difficulty in walking, not elsewhere classified: Secondary | ICD-10-CM

## 2024-02-22 NOTE — Therapy (Signed)
 OUTPATIENT PHYSICAL THERAPY LOWER EXTREMITY TREATMENT   Patient Name: Danielle Fitzpatrick MRN: [id number] DOB:[name] 25, 1951, 73 y.o., female Today's Date: 02/22/2024  END OF SESSION:  PT End of Session - 02/22/24 1316     Visit Number 2    Date for PT Re-Evaluation 03/31/24    Authorization Type Medicare + [employer]    Progress Note Due on Visit 10    PT Start Time 1310    PT Stop Time 1403    PT Time Calculation (min) 53 min    Activity Tolerance Patient tolerated treatment well    Behavior During Therapy WFL for tasks assessed/performed              Past Medical History:  Diagnosis Date   Allergy    Anxiety    Arthritis    Chicken pox    Depression    Diverticulitis    Diverticulosis    Hyperlipidemia    Past Surgical History:  Procedure Laterality Date   AUGMENTATION MAMMAPLASTY     LAPAROTOMY N/A 10/06/2023   Procedure: EXPLORATORY LAPAROTOMY;  Surgeon: Diamantina Monks, MD;  Location: [facility] OR;  Service: General;  Laterality: N/A;   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   Enthesopathy 02/09/2012   Other lymphedema 07/07/2011     PCP: Pearline Cables, MD   REFERRING PROVIDER: Beverely Low, MD  REFERRING DIAG:  [phone] (ICD-10-CM) - Other specified arthritis, right knee  Z96.651 (ICD-10-CM) - Total knee replacement status, right    THERAPY DIAG:  Acute pain of right knee  Stiffness of right knee, not elsewhere  classified  Muscle weakness (generalized)  Localized edema  Difficulty in walking, not elsewhere classified  Rationale for Evaluation and Treatment: Rehabilitation  ONSET DATE: DOS 02/15/2024  SUBJECTIVE:   SUBJECTIVE STATEMENT: Pt. Reports night pain is the worst,    NEXT MD VISIT: 03/01/2024  PERTINENT HISTORY: R knee OA, recent abdominal surgery (10/06/23), degenerative disc disease of lumbar spine, chronic LBP PAIN:  Are you having pain? Yes: NPRS scale: 4/10  Pain location: R knee Pain description: ache, post surgical  Aggravating factors: bending Relieving factors: tylenol, ice  PRECAUTIONS: Knee  RED FLAGS: None   WEIGHT BEARING RESTRICTIONS: No  FALLS:  Has patient fallen in last 6 months? No  LIVING ENVIRONMENT: Lives with: lives alone  adult children is assisting Lives in: House/apartment Stairs: No Has following equipment at home: Single point cane, Environmental consultant - 2 wheeled, and shower chair  OCCUPATION: retired  PLOF: Independent and Leisure: pilates and water aerobics   PATIENT GOALS: return to activities    OBJECTIVE:  Note: Objective measures were completed at Evaluation unless otherwise noted.  DIAGNOSTIC FINDINGS: DG lumbar spine 03/18/23 IMPRESSION: Progressive, severe degenerative disc disease at L4-5 and L5-S1 with unchanged grade 2 anterolisthesis at L4-L5 and moderate-severe facet arthropathy at these levels.   Unchanged moderate degenerative disc disease at L1-L2 and L2-L3.  PATIENT SURVEYS:  LEFS 11/80  COGNITION: Overall cognitive status: Within functional limits for tasks  assessed     SENSATION: WFL  EDEMA:  Circumferential:  knee L 32, R 36 cm thigh L 36 R 46 cm   MUSCLE LENGTH: NA  POSTURE: No Significant postural limitations  PALPATION: Negative squeeze test  LOWER EXTREMITY ROM:  Active ROM Right eval Left eval  Knee flexion 90 130  Knee extension Lacking 11 0   (Blank rows = not tested)  LOWER EXTREMITY  MMT:  MMT Right eval Left eval  Hip flexion 3 5  Hip extension    Hip abduction    Hip adduction    Knee flexion 3+ 5  Knee extension 2+ 25 deg knee lag 5  Ankle dorsiflexion 4 5  Ankle plantarflexion     (Blank rows = not tested)   GAIT: Distance walked: 50' Assistive device utilized: Environmental consultant - 2 wheeled Level of assistance: Modified independence Comments: 0.6 m/s, reciprocal step, decreased heel strike on Right.    TODAY'S TREATMENT:                                                                                                                              DATE:  02/22/24 Therapeutic Exercise: to improve strength, ROM, flexibility, and endurance  Nustep L5x52min UE/LE Seated heel slides 2x10 Seated LAQ x 10 RLE- cues to avoid hip flexion Supine R QS 10x5"; 2 sets towel under knee Supine heel slides 2x10 Standing ant/post x 10   GAIT TRAINING: To normalize gait pattern and improve safety. With rolling walker; supervision cues to increase heel strike and hip/knee flexion; unsteady with SPC today  Game Ready x 10 min R knee med compression 34 deg   02/18/24 EVAL Self Care: Education on wound care, s/s infection, precautions, importance of mobilization, sleep positioning, pain management, edema management.  Therapeutic Exercise: to improve strength and mobility.  Demo, verbal and tactile cues throughout for technique. Initial HEP - quad sets supine and seated, knee AROM, knee flexion stretch seated.        PATIENT EDUCATION:  Education details: fingings, POC, initial HEP Person educated: Patient Education method: Explanation, Demonstration, Verbal cues, and Handouts Education comprehension: verbalized understanding and returned demonstration  HOME EXERCISE PROGRAM: Access Code: [email or web address] URL: [email or web address] Date: 02/18/2024 Prepared by: Harrie Foreman  Exercises - Seated Knee Flexion Slide  - 3 x daily - 7 x weekly - 1 sets - 10 reps -  Seated Heel Slide  - 1 x daily - 7 x weekly - 3 sets - 10 reps - Seated Long Arc Quad  - 3 x daily - 7 x weekly - 1 sets - 10 reps - Supine Quad Set on Towel Roll  - 3 x daily - 7 x weekly - 1 sets - 10 reps - Seated Quad Set  - 3 x daily - 7 x weekly - 1 sets - 10 reps  ASSESSMENT:  CLINICAL IMPRESSION: Patient is a 73 y.o. fmeale who was seen today for physical therapy evaluation and  treatment for s/p Right TKR on 02/15/2024.  Initiated R knee ROM exercises and strengthening per tolerance. Reviewed gait training, provided instruction on correct her gait pattern, she was unsteady on [facility] so advised her to continue using walker. Thomos Lemons will benefit from skilled PT services to address all limitations, reduce pain, and reach optimal level of function.   OBJECTIVE IMPAIRMENTS: Abnormal gait, decreased activity tolerance, decreased balance, decreased endurance, decreased mobility, difficulty walking, decreased ROM, decreased strength, hypomobility, increased edema, increased fascial restrictions, impaired perceived functional ability, increased muscle spasms, and pain.   ACTIVITY LIMITATIONS: carrying, lifting, bending, sitting, standing, squatting, sleeping, stairs, transfers, bed mobility, dressing, and locomotion level  PARTICIPATION LIMITATIONS: meal prep, cleaning, laundry, driving, shopping, community activity, and yard work  PERSONAL FACTORS: Age, Past/current experiences, Transportation, and 3+ comorbidities: recent abdominal surgery (10/06/23), degenerative disc disease of lumbar spine, chronic LBP  are also affecting patient's functional outcome.   REHAB POTENTIAL: Good  CLINICAL DECISION MAKING: Evolving/moderate complexity  EVALUATION COMPLEXITY: Moderate   GOALS: Goals reviewed with patient? Yes   SHORT TERM GOALS: Target date: 03/03/2024    Independent with initial HEP. Baseline:  Goal status: INITIAL  2.  Patient will have full right knee extension for safety  with gait.  Baseline: lacking 10 deg.  Goal status: INITIAL  LONG TERM GOALS: Target date: 03/31/2024   Independent with advanced/ongoing HEP to improve outcomes and carryover.  Baseline:  Goal status: INITIAL  2.  Patient will demonstrate right knee flexion to 120 deg to ascend/descend stairs. Baseline: 90 Goal status: INITIAL  3. Patient will be able to ambulate 600' safely without AD and normal gait pattern to access community.  Baseline: 2WRW Goal status: INITIAL  4.  Patient will be able to ascend/descend stairs with 1 HR and reciprocal step pattern safely to access home and community.  Baseline: unable  Goal status: INITIAL   5.  Patient will demonstrate > 19/24 on DGI to demonstrate decreased risk of falls.   Baseline: NT Goal status: INITIAL  6.  Patient will demonstrate improved functional LE strength by completing 5x STS in <14 seconds.   Baseline: NT Goal status: INITIAL  7.  Patient will demonstrate 40/80 on LEFS to demonstrate improved mobility.  Baseline: 11/80 Goal status: INITIAL  PLAN:  PT FREQUENCY: 2-3x/week  PT DURATION: 6 weeks  PLANNED INTERVENTIONS: 97110-Therapeutic exercises, 97530- Therapeutic activity, 97112- Neuromuscular re-education, 97535- Self Care, 78295- Manual therapy, [phone]- Gait training, [phone]- Orthotic Fit/training, [phone]- Electrical stimulation (unattended), [phone]- Electrical stimulation (manual), [id number]- Vasopneumatic device, Patient/Family education, Balance training, Stair training, Taping, Dry Needling, Joint mobilization, Joint manipulation, Spinal manipulation, Spinal mobilization, Scar mobilization, Cryotherapy, and Moist heat  PLAN FOR NEXT SESSION: review progress supine exercises, add hip strengthening exercises, ROM measurements weekly, progress strength, balance, gait as tolerated.  Modalities PRN.    Darleene Cleaver, PTA 02/22/2024, 1:55 PM

## 2024-02-24 ENCOUNTER — Ambulatory Visit: Payer: Medicare Other

## 2024-02-24 DIAGNOSIS — M25561 Pain in right knee: Secondary | ICD-10-CM

## 2024-02-24 DIAGNOSIS — M6281 Muscle weakness (generalized): Secondary | ICD-10-CM

## 2024-02-24 DIAGNOSIS — R262 Difficulty in walking, not elsewhere classified: Secondary | ICD-10-CM

## 2024-02-24 DIAGNOSIS — R600 Localized edema: Secondary | ICD-10-CM

## 2024-02-24 DIAGNOSIS — M25661 Stiffness of right knee, not elsewhere classified: Secondary | ICD-10-CM

## 2024-02-24 NOTE — Therapy (Signed)
 OUTPATIENT PHYSICAL THERAPY LOWER EXTREMITY TREATMENT   Patient Name: Danielle Fitzpatrick MRN: [id number] DOB:[birth year], 73 y.o., female Today's Date: 02/24/2024  END OF SESSION:  PT End of Session - 02/24/24 1448     Visit Number 3    Date for PT Re-Evaluation 03/31/24    Authorization Type Medicare + AARP    Progress Note Due on Visit 10    PT Start Time 1404    PT Stop Time 1455    PT Time Calculation (min) 51 min    Activity Tolerance Patient tolerated treatment well    Behavior During Therapy WFL for tasks assessed/performed               Past Medical History:  Diagnosis Date   Allergy    Anxiety    Arthritis    Chicken pox    Depression    Diverticulitis    Diverticulosis    Hyperlipidemia    Past Surgical History:  Procedure Laterality Date   AUGMENTATION MAMMAPLASTY     LAPAROTOMY N/A 10/06/2023   Procedure: EXPLORATORY LAPAROTOMY;  Surgeon: Diamantina Monks, MD;  Location: [facility] OR;  Service: General;  Laterality: N/A;   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   Enthesopathy 02/09/2012   Other lymphedema 07/07/2011     PCP: Pearline Cables, MD   REFERRING PROVIDER: Beverely Low, MD  REFERRING DIAG:  [phone] (ICD-10-CM) - Other specified arthritis, right knee  Z96.651 (ICD-10-CM) - Total knee replacement status, right    THERAPY DIAG:  Acute pain of right knee  Stiffness of right knee, not elsewhere  classified  Muscle weakness (generalized)  Localized edema  Difficulty in walking, not elsewhere classified  Rationale for Evaluation and Treatment: Rehabilitation  ONSET DATE: DOS 02/15/2024  SUBJECTIVE:   SUBJECTIVE STATEMENT: " Always taking pain medication, noting R glute/SIJ paint today."  NEXT MD VISIT: 03/01/2024  PERTINENT HISTORY: R knee OA, recent abdominal surgery (10/06/23), degenerative disc disease of lumbar spine, chronic LBP PAIN:  Are you having pain? Yes: NPRS scale: 4/10  Pain location: R knee Pain description: ache, post surgical  Aggravating factors: bending Relieving factors: tylenol, ice  PRECAUTIONS: Knee  RED FLAGS: None   WEIGHT BEARING RESTRICTIONS: No  FALLS:  Has patient fallen in last 6 months? No  LIVING ENVIRONMENT: Lives with: lives alone  adult children is assisting Lives in: House/apartment Stairs: No Has following equipment at home: Single point cane, Environmental consultant - 2 wheeled, and shower chair  OCCUPATION: retired  PLOF: Independent and Leisure: pilates and water aerobics   PATIENT GOALS: return to activities    OBJECTIVE:  Note: Objective measures were completed at Evaluation unless otherwise noted.  DIAGNOSTIC FINDINGS: DG lumbar spine 03/18/23 IMPRESSION: Progressive, severe degenerative disc disease at L4-5 and L5-S1 with unchanged grade 2 anterolisthesis at L4-L5 and moderate-severe facet arthropathy at these levels.   Unchanged moderate degenerative disc disease at L1-L2 and L2-L3.  PATIENT SURVEYS:  LEFS 11/80  COGNITION: Overall cognitive status: Within functional limits  for tasks assessed     SENSATION: WFL  EDEMA:  Circumferential:  knee L 32, R 36 cm thigh L 36 R 46 cm   MUSCLE LENGTH: NA  POSTURE: No Significant postural limitations  PALPATION: Negative squeeze test  LOWER EXTREMITY ROM:  Active ROM Right eval Left eval  Knee flexion 90 130  Knee extension Lacking 11 0   (Blank rows = not  tested)  LOWER EXTREMITY MMT:  MMT Right eval Left eval  Hip flexion 3 5  Hip extension    Hip abduction    Hip adduction    Knee flexion 3+ 5  Knee extension 2+ 25 deg knee lag 5  Ankle dorsiflexion 4 5  Ankle plantarflexion     (Blank rows = not tested)   GAIT: Distance walked: 50' Assistive device utilized: Environmental consultant - 2 wheeled Level of assistance: Modified independence Comments: 0.6 m/s, reciprocal step, decreased heel strike on Right.    TODAY'S TREATMENT:                                                                                                                              DATE: 02/24/24 Therapeutic Exercise: to improve strength, ROM, flexibility, and endurance  Nustep L5x60min UE/LE  Rec Bike Partial rev x 2 min Supine QS - cues to avoid engaging glutes towel under knee Supine SLR x 10 Thomas stretch x 1 min R side Seated R LAQ x 10  GAIT TRAINING: To normalize gait pattern and improve safety. Cues to avoid R hip circumduction, to increase heel strike, increase knee flexion ; 270 feet with RW supervision  Manual Therapy: to decrease muscle spasm, pain and improve mobility.  STM to R glute med/min, lumbar paraspinals in sidelying  Game Ready x 10 min R knee med compression 34 deg  02/22/24 Therapeutic Exercise: to improve strength, ROM, flexibility, and endurance  Nustep L5x33min UE/LE Seated heel slides 2x10 Seated LAQ x 10 RLE- cues to avoid hip flexion Supine R QS 10x5"; 2 sets towel under knee Supine heel slides 2x10 Standing ant/post x 10   GAIT TRAINING: To normalize gait pattern and improve safety. With rolling walker; supervision cues to increase heel strike and hip/knee flexion; unsteady with SPC today  Game Ready x 10 min R knee med compression 34 deg   02/18/24 EVAL Self Care: Education on wound care, s/s infection, precautions, importance of mobilization, sleep positioning, pain management, edema management.  Therapeutic Exercise: to  improve strength and mobility.  Demo, verbal and tactile cues throughout for technique. Initial HEP - quad sets supine and seated, knee AROM, knee flexion stretch seated.        PATIENT EDUCATION:  Education details: HEP Person educated: Patient Education method: Programmer, multimedia, Facilities manager, Verbal cues, and Handouts Education comprehension: verbalized understanding and returned demonstration  HOME EXERCISE PROGRAM: Access Code: [email or web address] URL: [email or web address] Date: 02/24/2024 Prepared by: Verta Ellen  Exercises - Seated Knee Flexion Slide  - 3 x daily -  7 x weekly - 1 sets - 10 reps - Seated Heel Slide  - 1 x daily - 7 x weekly - 3 sets - 10 reps - Seated Long Arc Quad  - 3 x daily - 7 x weekly - 1 sets - 10 reps - Supine Quad Set on Towel Roll  - 3 x daily - 7 x weekly - 1 sets - 10 reps - Seated Quad Set  - 3 x daily - 7 x weekly - 1 sets - 10 reps - Modified Thomas Stretch  - 1 x daily - 7 x weekly - 2 sets - 2 reps - 1 min hold  ASSESSMENT:  CLINICAL IMPRESSION: Patient is a 73 y.o. fmeale who was seen today for physical therapy evaluation and treatment for s/p Right TKR on 02/15/2024. Pt with good response to treatment. More tightness noted today in her R glutes from compensatory gait patterns, able to address with STM. Introduced hip flexor stretching to improve her gait as well. Cues needed to avoid glute activation with quad sets. Game ready post session for edema and pain post exercise. Thomos Lemons will benefit from skilled PT services to address all limitations, reduce pain, and reach optimal level of function.   OBJECTIVE IMPAIRMENTS: Abnormal gait, decreased activity tolerance, decreased balance, decreased endurance, decreased mobility, difficulty walking, decreased ROM, decreased strength, hypomobility, increased edema, increased fascial restrictions, impaired perceived functional ability, increased muscle spasms, and pain.   ACTIVITY  LIMITATIONS: carrying, lifting, bending, sitting, standing, squatting, sleeping, stairs, transfers, bed mobility, dressing, and locomotion level  PARTICIPATION LIMITATIONS: meal prep, cleaning, laundry, driving, shopping, community activity, and yard work  PERSONAL FACTORS: Age, Past/current experiences, Transportation, and 3+ comorbidities: recent abdominal surgery (10/06/23), degenerative disc disease of lumbar spine, chronic LBP  are also affecting patient's functional outcome.   REHAB POTENTIAL: Good  CLINICAL DECISION MAKING: Evolving/moderate complexity  EVALUATION COMPLEXITY: Moderate   GOALS: Goals reviewed with patient? Yes   SHORT TERM GOALS: Target date: 03/03/2024    Independent with initial HEP. Baseline:  Goal status: IN PROGRESS  2.  Patient will have full right knee extension for safety with gait.  Baseline: lacking 10 deg.  Goal status: IN PROGRESS  LONG TERM GOALS: Target date: 03/31/2024   Independent with advanced/ongoing HEP to improve outcomes and carryover.  Baseline:  Goal status: IN PROGRESS  2.  Patient will demonstrate right knee flexion to 120 deg to ascend/descend stairs. Baseline: 90 Goal status: IN PROGRESS  3. Patient will be able to ambulate 600' safely without AD and normal gait pattern to access community.  Baseline: 2WRW Goal status: IN PROGRESS  4.  Patient will be able to ascend/descend stairs with 1 HR and reciprocal step pattern safely to access home and community.  Baseline: unable  Goal status: IN PROGRESS   5.  Patient will demonstrate > 19/24 on DGI to demonstrate decreased risk of falls.   Baseline: NT Goal status: IN PROGRESS  6.  Patient will demonstrate improved functional LE strength by completing 5x STS in <14 seconds.   Baseline: NT Goal status: IN PROGRESS  7.  Patient will demonstrate 40/80 on LEFS to demonstrate improved mobility.  Baseline: 11/80 Goal status: IN PROGRESS  PLAN:  PT FREQUENCY: 2-3x/week  PT  DURATION: 6 weeks  PLANNED INTERVENTIONS: 97110-Therapeutic exercises, 97530- Therapeutic activity, [id number]- Neuromuscular re-education, 97535- Self Care, [id number]- Manual therapy, [phone]- Gait training, [phone]- Orthotic Fit/training, [phone]- Electrical stimulation (unattended), [phone]- Electrical stimulation (manual), [id number]- Vasopneumatic device, Patient/Family education,  Balance training, Stair training, Taping, Dry Needling, Joint mobilization, Joint manipulation, Spinal manipulation, Spinal mobilization, Scar mobilization, Cryotherapy, and Moist heat  PLAN FOR NEXT SESSION: measure knee ROM; review progress supine exercises, add hip strengthening exercises, ROM measurements weekly, progress strength, balance, gait as tolerated.  Modalities PRN.    Darleene Cleaver, PTA 02/24/2024, 2:48 PM

## 2024-02-26 ENCOUNTER — Ambulatory Visit: Payer: Medicare Other | Admitting: Physical Therapy

## 2024-02-26 ENCOUNTER — Encounter: Payer: Self-pay | Admitting: Physical Therapy

## 2024-02-26 DIAGNOSIS — M25561 Pain in right knee: Secondary | ICD-10-CM

## 2024-02-26 DIAGNOSIS — M6281 Muscle weakness (generalized): Secondary | ICD-10-CM

## 2024-02-26 DIAGNOSIS — R600 Localized edema: Secondary | ICD-10-CM

## 2024-02-26 DIAGNOSIS — R262 Difficulty in walking, not elsewhere classified: Secondary | ICD-10-CM

## 2024-02-26 DIAGNOSIS — M25661 Stiffness of right knee, not elsewhere classified: Secondary | ICD-10-CM

## 2024-02-26 NOTE — Patient Instructions (Signed)

## 2024-02-26 NOTE — Therapy (Signed)
 OUTPATIENT PHYSICAL THERAPY LOWER EXTREMITY TREATMENT   Patient Name: Danielle Fitzpatrick MRN: [id number] DOB:1951/02/12, 73 y.o., female Today's Date: 02/26/2024  END OF SESSION:  PT End of Session - 02/26/24 1205     Visit Number 4    Date for PT Re-Evaluation 03/31/24    Authorization Type Medicare + [employer]    Progress Note Due on Visit 10    PT Start Time 1100    PT Stop Time 1200    PT Time Calculation (min) 60 min    Activity Tolerance Patient tolerated treatment well    Behavior During Therapy WFL for tasks assessed/performed               Past Medical History:  Diagnosis Date   Allergy    Anxiety    Arthritis    Chicken pox    Depression    Diverticulitis    Diverticulosis    Hyperlipidemia    Past Surgical History:  Procedure Laterality Date   AUGMENTATION MAMMAPLASTY     LAPAROTOMY N/A 10/06/2023   Procedure: EXPLORATORY LAPAROTOMY;  Surgeon: Diamantina Monks, MD;  Location: [facility] OR;  Service: General;  Laterality: N/A;   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   Enthesopathy 02/09/2012   Other lymphedema 07/07/2011     PCP: Pearline Cables, MD   REFERRING PROVIDER: Beverely Low, MD  REFERRING DIAG:  [phone] (ICD-10-CM) - Other specified arthritis, right knee  Z96.651 (ICD-10-CM) - Total knee replacement status, right    THERAPY DIAG:  Acute pain of right knee  Stiffness of right knee, not elsewhere  classified  Muscle weakness (generalized)  Localized edema  Difficulty in walking, not elsewhere classified  Rationale for Evaluation and Treatment: Rehabilitation  ONSET DATE: DOS 02/15/2024  SUBJECTIVE:   SUBJECTIVE STATEMENT: Reports has a big knot in her back which is really bothering her, felt better after PTA worked on it last session.  Knee throbs at night, very tired because of lack of sleep.  Doing her exercises daily.   NEXT MD VISIT: 03/01/2024  PERTINENT HISTORY: R knee OA, recent abdominal surgery (10/06/23), degenerative disc disease of lumbar spine, chronic LBP PAIN:  Are you having pain? Yes: NPRS scale: 4/10  Pain location: R knee Pain description: ache, post surgical  Aggravating factors: bending Relieving factors: tylenol, ice  PRECAUTIONS: Knee  RED FLAGS: None   WEIGHT BEARING RESTRICTIONS: No  FALLS:  Has patient fallen in last 6 months? No  LIVING ENVIRONMENT: Lives with: lives alone  adult children is assisting Lives in: House/apartment Stairs: No Has following equipment at home: Single point cane, Environmental consultant - 2 wheeled, and shower chair  OCCUPATION: retired  PLOF: Independent and Leisure: pilates and water aerobics   PATIENT GOALS: return to activities    OBJECTIVE:  Note: Objective measures were completed at Evaluation unless otherwise noted.  DIAGNOSTIC FINDINGS: DG lumbar spine 03/18/23 IMPRESSION: Progressive, severe degenerative disc disease at L4-5 and L5-S1 with unchanged grade 2 anterolisthesis at L4-L5 and moderate-severe  facet arthropathy at these levels.   Unchanged moderate degenerative disc disease at L1-L2 and L2-L3.  PATIENT SURVEYS:  LEFS 11/80  COGNITION: Overall cognitive status: Within functional limits for tasks assessed     SENSATION: WFL  EDEMA:  Circumferential:  knee L 32, R 36 cm thigh L 36 R 46 cm   MUSCLE LENGTH: NA  POSTURE: No Significant postural limitations  PALPATION: Negative squeeze  test  LOWER EXTREMITY ROM:  Active ROM Right eval Left eval Right 02/26/24  Knee flexion 90 130 95  Knee extension Lacking 11 0 Lacking 6   (Blank rows = not tested)  LOWER EXTREMITY MMT:  MMT Right eval Left eval  Hip flexion 3 5  Hip extension    Hip abduction    Hip adduction    Knee flexion 3+ 5  Knee extension 2+ 25 deg knee lag 5  Ankle dorsiflexion 4 5  Ankle plantarflexion     (Blank rows = not tested)   GAIT: Distance walked: 50' Assistive device utilized: Environmental consultant - 2 wheeled Level of assistance: Modified independence Comments: 0.6 m/s, reciprocal step, decreased heel strike on Right.    TODAY'S TREATMENT:                                                                                                                              DATE:  02/26/24 Therapeutic Exercise: to improve strength and mobility.  Demo, verbal and tactile cues throughout for technique. Bike partial revolutions x 7 min  SAQ x 15 SLR x 10  Heel slides x 10  Bridge x 4 - painful -discontinued Gait Training With SPC, checked height of cane, reviewed use of cane, tried both hands, R hand most comfortable for knee, gait 2 x 50' with cues for placement.  Manual Therapy: to decrease muscle spasm and pain and improve mobility STM/TPR to R glute, STM/TPR to R hamstrings, skilled palpation and monitoring during dry needling. Trigger Point Dry Needling  Initial Treatment: Pt instructed on Dry Needling rational, procedures, and possible side effects. Pt instructed to expect mild to moderate muscle soreness later in the day and/or into the next day.  Pt instructed in methods to reduce muscle soreness. Pt instructed to continue prescribed HEP. Patient was educated on signs and symptoms of infection and other risk factors and advised to seek medical attention should they occur.  Patient verbalized understanding of these instructions and education.   Patient Verbal Consent Given: Yes Education  Handout Provided: Yes Muscles Treated: R glut max Electrical Stimulation Performed: No Treatment Response/Outcome: Twitch Response Elicited and Palpable Increase in Muscle Length  Vasopneumatic: Gameready at end of session for post session soreness/edema.  10 min, low compression, 34 deg (coldest).    02/24/24 Therapeutic Exercise: to improve strength, ROM, flexibility, and endurance  Nustep L5x11min UE/LE  Rec Bike Partial rev x 2 min Supine QS - cues to avoid engaging glutes towel under knee Supine SLR x 10 Maisie Fus  stretch x 1 min R side Seated R LAQ x 10  GAIT TRAINING: To normalize gait pattern and improve safety. Cues to avoid R hip circumduction, to increase heel strike, increase knee flexion ; 270 feet with RW supervision  Manual Therapy: to decrease muscle spasm, pain and improve mobility.  STM to R glute med/min, lumbar paraspinals in sidelying  Game Ready x 10 min R knee med compression 34 deg  02/22/24 Therapeutic Exercise: to improve strength, ROM, flexibility, and endurance  Nustep L5x69min UE/LE Seated heel slides 2x10 Seated LAQ x 10 RLE- cues to avoid hip flexion Supine R QS 10x5"; 2 sets towel under knee Supine heel slides 2x10 Standing ant/post x 10   GAIT TRAINING: To normalize gait pattern and improve safety. With rolling walker; supervision cues to increase heel strike and hip/knee flexion; unsteady with SPC today  Game Ready x 10 min R knee med compression 34 deg       PATIENT EDUCATION:  Education details: TrDN Person educated: Patient Education method: Chief Technology Officer Education comprehension: verbalized understanding  HOME EXERCISE PROGRAM: Access Code: [email or web address] URL: [email or web address] Date: 02/24/2024 Prepared by: Verta Ellen  Exercises - Seated Knee Flexion Slide  - 3 x daily - 7 x weekly - 1 sets - 10 reps - Seated Heel Slide  - 1 x daily - 7 x weekly - 3 sets - 10 reps - Seated Long Arc Quad  - 3 x daily - 7  x weekly - 1 sets - 10 reps - Supine Quad Set on Towel Roll  - 3 x daily - 7 x weekly - 1 sets - 10 reps - Seated Quad Set  - 3 x daily - 7 x weekly - 1 sets - 10 reps - Modified Thomas Stretch  - 1 x daily - 7 x weekly - 2 sets - 2 reps - 1 min hold  ASSESSMENT:  CLINICAL IMPRESSION: Patient is a 73 y.o. fmeale who was seen today for physical therapy treatment for s/p Right TKR on 02/15/2024. She reports continued spasm in R glute affecting QOL with very large palpable trigger point, so after explanation of DN rational, procedures, outcomes and potential side effects, patient verbalized consent to DN treatment in conjunction with manual STM/DTM and TPR to reduce ttp/muscle tension. Muscles treated as indicated above. DN produced normal response with good twitches elicited resulting in palpable reduction in pain/ttp and muscle tension, with patient noting less pain upon initiation of movement following DN. Pt educated to expect mild to moderate muscle soreness for up to 24-48 hrs and instructed to continue prescribed home exercise program and current activity level with pt verbalizing understanding of theses instructions. She also brought her cane, worked on gait training with cane, no LOB or unsteadiness.  ROM and strength continues to progress, as able to perform SAQ today, and ROM was 6-95.  Game ready post session for edema and pain post exercise. Danielle Fitzpatrick continues to demonstrate potential for improvement and would benefit from continued skilled therapy to address impairments.      OBJECTIVE IMPAIRMENTS: Abnormal gait, decreased activity tolerance, decreased balance, decreased endurance, decreased mobility, difficulty walking, decreased ROM, decreased strength, hypomobility, increased edema, increased fascial restrictions, impaired perceived functional ability, increased muscle spasms, and pain.   ACTIVITY LIMITATIONS: carrying, lifting, bending, sitting, standing, squatting, sleeping,  stairs, transfers, bed mobility, dressing, and locomotion level  PARTICIPATION LIMITATIONS: meal prep, cleaning, laundry, driving, shopping, community activity, and yard work  PERSONAL FACTORS: Age, Past/current experiences, Transportation,  and 3+ comorbidities: recent abdominal surgery (10/06/23), degenerative disc disease of lumbar spine, chronic LBP  are also affecting patient's functional outcome.   REHAB POTENTIAL: Good  CLINICAL DECISION MAKING: Evolving/moderate complexity  EVALUATION COMPLEXITY: Moderate   GOALS: Goals reviewed with patient? Yes   SHORT TERM GOALS: Target date: 03/03/2024    Independent with initial HEP. Baseline:  Goal status: MET 02/26/24  2.  Patient will have full right knee extension for safety with gait.  Baseline: lacking 10 deg.  Goal status: IN PROGRESS 02/26/24 lacking 6 deg.   LONG TERM GOALS: Target date: 03/31/2024   Independent with advanced/ongoing HEP to improve outcomes and carryover.  Baseline:  Goal status: IN PROGRESS  2.  Patient will demonstrate right knee flexion to 120 deg to ascend/descend stairs. Baseline: 90 Goal status: IN PROGRESS  3. Patient will be able to ambulate 600' safely without AD and normal gait pattern to access community.  Baseline: 2WRW Goal status: IN PROGRESS  4.  Patient will be able to ascend/descend stairs with 1 HR and reciprocal step pattern safely to access home and community.  Baseline: unable  Goal status: IN PROGRESS   5.  Patient will demonstrate > 19/24 on DGI to demonstrate decreased risk of falls.   Baseline: NT Goal status: IN PROGRESS  6.  Patient will demonstrate improved functional LE strength by completing 5x STS in <14 seconds.   Baseline: NT Goal status: IN PROGRESS  7.  Patient will demonstrate 40/80 on LEFS to demonstrate improved mobility.  Baseline: 11/80 Goal status: IN PROGRESS  PLAN:  PT FREQUENCY: 2-3x/week  PT DURATION: 6 weeks  PLANNED INTERVENTIONS:  97110-Therapeutic exercises, 97530- Therapeutic activity, 97112- Neuromuscular re-education, [phone]- Self Care, 19147- Manual therapy, [phone]- Gait training, [phone]- Orthotic Fit/training, [phone]- Electrical stimulation (unattended), [phone]- Electrical stimulation (manual), 97016- Vasopneumatic device, Patient/Family education, Balance training, Stair training, Taping, Dry Needling, Joint mobilization, Joint manipulation, Spinal manipulation, Spinal mobilization, Scar mobilization, Cryotherapy, and Moist heat  PLAN FOR NEXT SESSION: review progress supine exercises, add hip strengthening exercises, ROM measurements weekly, progress strength, balance, gait as tolerated.  Modalities PRN.    Jena Gauss, PT 02/26/2024, 12:24 PM

## 2024-02-29 ENCOUNTER — Ambulatory Visit: Payer: Medicare Other

## 2024-02-29 DIAGNOSIS — R262 Difficulty in walking, not elsewhere classified: Secondary | ICD-10-CM

## 2024-02-29 DIAGNOSIS — M6281 Muscle weakness (generalized): Secondary | ICD-10-CM

## 2024-02-29 DIAGNOSIS — M25661 Stiffness of right knee, not elsewhere classified: Secondary | ICD-10-CM

## 2024-02-29 DIAGNOSIS — M25561 Pain in right knee: Secondary | ICD-10-CM | POA: Diagnosis not present

## 2024-02-29 DIAGNOSIS — R600 Localized edema: Secondary | ICD-10-CM

## 2024-02-29 NOTE — Therapy (Signed)
 OUTPATIENT PHYSICAL THERAPY LOWER EXTREMITY TREATMENT   Patient Name: Remmy Crass MRN: [id number] DOB:10/02/1951, 73 y.o., female Today's Date: 02/29/2024  END OF SESSION:  PT End of Session - 02/29/24 1356     Visit Number 5    Date for PT Re-Evaluation 03/31/24    Authorization Type Medicare + AARP    Progress Note Due on Visit 10    PT Start Time 1312    PT Stop Time 1405    PT Time Calculation (min) 53 min    Activity Tolerance Patient tolerated treatment well    Behavior During Therapy WFL for tasks assessed/performed                Past Medical History:  Diagnosis Date   Allergy    Anxiety    Arthritis    Chicken pox    Depression    Diverticulitis    Diverticulosis    Hyperlipidemia    Past Surgical History:  Procedure Laterality Date   AUGMENTATION MAMMAPLASTY     LAPAROTOMY N/A 10/06/2023   Procedure: EXPLORATORY LAPAROTOMY;  Surgeon: Diamantina Monks, MD;  Location: [facility] OR;  Service: General;  Laterality: N/A;   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   Enthesopathy 02/09/2012   Other lymphedema 07/07/2011     PCP: Pearline Cables, MD   REFERRING PROVIDER: Beverely Low, MD  REFERRING DIAG:  [phone] (ICD-10-CM) - Other specified arthritis, right knee  Z96.651 (ICD-10-CM) - Total knee replacement status, right    THERAPY DIAG:  Acute pain of right knee  Stiffness of right knee, not elsewhere  classified  Muscle weakness (generalized)  Localized edema  Difficulty in walking, not elsewhere classified  Rationale for Evaluation and Treatment: Rehabilitation  ONSET DATE: DOS 02/15/2024  SUBJECTIVE:   SUBJECTIVE STATEMENT: Pt not sleeping much at night.   NEXT MD VISIT: 03/01/2024  PERTINENT HISTORY: R knee OA, recent abdominal surgery (10/06/23), degenerative disc disease of lumbar spine, chronic LBP PAIN:  Are you having pain? Yes: NPRS scale: 4/10  Pain location: R knee Pain description: ache, post surgical  Aggravating factors: bending Relieving factors: tylenol, ice  PRECAUTIONS: Knee  RED FLAGS: None   WEIGHT BEARING RESTRICTIONS: No  FALLS:  Has patient fallen in last 6 months? No  LIVING ENVIRONMENT: Lives with: lives alone  adult children is assisting Lives in: House/apartment Stairs: No Has following equipment at home: Single point cane, Environmental consultant - 2 wheeled, and shower chair  OCCUPATION: retired  PLOF: Independent and Leisure: pilates and water aerobics   PATIENT GOALS: return to activities    OBJECTIVE:  Note: Objective measures were completed at Evaluation unless otherwise noted.  DIAGNOSTIC FINDINGS: DG lumbar spine 03/18/23 IMPRESSION: Progressive, severe degenerative disc disease at L4-5 and L5-S1 with unchanged grade 2 anterolisthesis at L4-L5 and moderate-severe facet arthropathy at these levels.   Unchanged moderate degenerative disc disease at L1-L2 and L2-L3.  PATIENT SURVEYS:  LEFS 11/80  COGNITION: Overall cognitive status: Within functional limits for tasks  assessed     SENSATION: WFL  EDEMA:  Circumferential:  knee L 32, R 36 cm thigh L 36 R 46 cm   MUSCLE LENGTH: NA  POSTURE: No Significant postural limitations  PALPATION: Negative squeeze test  LOWER EXTREMITY ROM:  Active ROM Right eval Left eval Right 02/26/24 R 02/29/24  Knee flexion 90 130 95 100 A/109 P  Knee extension Lacking 11 0 Lacking 6 4    (Blank rows = not tested)  LOWER EXTREMITY MMT:  MMT Right eval Left eval  Hip flexion 3 5  Hip extension    Hip abduction    Hip adduction    Knee flexion 3+ 5  Knee extension 2+ 25 deg knee lag 5  Ankle dorsiflexion 4 5  Ankle plantarflexion     (Blank rows = not tested)   GAIT: Distance walked: 50' Assistive device utilized: Environmental consultant - 2 wheeled Level of assistance: Modified independence Comments: 0.6 m/s, reciprocal step, decreased heel strike on Right.    TODAY'S TREATMENT:                                                                                                                              DATE: 02/29/24 Therapeutic Exercise: to improve strength and mobility.  Demo, verbal and tactile cues throughout for technique. Bike partial revolutions x 6 min Seated R LAQ x 10 Seated marching x 10 bil Supine R thomas stretch with L SKTC x 1 min Long sitting heel slides AAROM 2x10 Supine R knee extension stretch w/ towel roll under heel Hamstring stretch seated 2x30" Gait Training With SPC- 90 ft x2 cues to increase knee flexion Vasopneumatic: Gameready at end of session for post session soreness/edema.  10 min, med compression, 34 deg (coldest).  02/26/24 Therapeutic Exercise: to improve strength and mobility.  Demo, verbal and tactile cues throughout for technique. Bike partial revolutions x 7 min  SAQ x 15 SLR x 10  Heel slides x 10  Bridge x 4 - painful -discontinued Gait Training With SPC, checked height of cane, reviewed use of cane, tried both hands, R hand most comfortable for knee, gait 2 x 50' with cues for placement.  Manual Therapy: to decrease muscle spasm and pain and improve mobility STM/TPR to R glute, STM/TPR to R hamstrings, skilled palpation and monitoring during dry needling. Trigger Point Dry Needling  Initial Treatment: Pt instructed on Dry Needling rational, procedures, and possible side effects. Pt instructed to expect mild to moderate muscle  soreness later in the day and/or into the next day.  Pt instructed in methods to reduce muscle soreness. Pt instructed to continue prescribed HEP. Patient was educated on signs and symptoms of infection and other risk factors and advised to seek medical attention should they occur.  Patient verbalized understanding of these instructions and education.   Patient Verbal Consent Given: Yes Education Handout Provided: Yes Muscles Treated: R glut max Electrical Stimulation Performed: No Treatment Response/Outcome: Twitch Response  Elicited and Palpable Increase in Muscle Length  Vasopneumatic: Gameready at end of session for post session soreness/edema.  10 min, low compression, 34 deg (coldest).    02/24/24 Therapeutic Exercise: to improve strength, ROM, flexibility, and endurance  Nustep L5x66min UE/LE  Rec Bike Partial rev x 2 min Supine QS - cues to avoid engaging glutes towel under knee Supine SLR x 10 Thomas stretch x 1 min R side Seated R LAQ x 10  GAIT TRAINING: To normalize gait pattern and improve safety. Cues to avoid R hip circumduction, to increase heel strike, increase knee flexion ; 270 feet with RW supervision  Manual Therapy: to decrease muscle spasm, pain and improve mobility.  STM to R glute med/min, lumbar paraspinals in sidelying  Game Ready x 10 min R knee med compression 34 deg  02/22/24 Therapeutic Exercise: to improve strength, ROM, flexibility, and endurance  Nustep L5x67min UE/LE Seated heel slides 2x10 Seated LAQ x 10 RLE- cues to avoid hip flexion Supine R QS 10x5"; 2 sets towel under knee Supine heel slides 2x10 Standing ant/post x 10   GAIT TRAINING: To normalize gait pattern and improve safety. With rolling walker; supervision cues to increase heel strike and hip/knee flexion; unsteady with SPC today  Game Ready x 10 min R knee med compression 34 deg       PATIENT EDUCATION:  Education details: TrDN Person educated: Patient Education method:  Chief Technology Officer Education comprehension: verbalized understanding  HOME EXERCISE PROGRAM: Access Code: [email or web address] URL: [email or web address] Date: 02/24/2024 Prepared by: Verta Ellen  Exercises - Seated Knee Flexion Slide  - 3 x daily - 7 x weekly - 1 sets - 10 reps - Seated Heel Slide  - 1 x daily - 7 x weekly - 3 sets - 10 reps - Seated Long Arc Quad  - 3 x daily - 7 x weekly - 1 sets - 10 reps - Supine Quad Set on Towel Roll  - 3 x daily - 7 x weekly - 1 sets - 10 reps - Seated Quad Set  - 3 x daily - 7 x weekly - 1 sets - 10 reps - Modified Thomas Stretch  - 1 x daily - 7 x weekly - 2 sets - 2 reps - 1 min hold  ASSESSMENT:  CLINICAL IMPRESSION: Pt continues to report some R LB and glute stiffness. Her ROM is improving well now measuring at 4-100 deg. She still is limited with lying supine d/t LBP and having nocturnal knee pain. Continued focus on ROM and with her improving motion would be able to progress to more strengthening next visit. Thomos Lemons continues to demonstrate potential for improvement and would benefit from continued skilled therapy to address impairments.      OBJECTIVE IMPAIRMENTS: Abnormal gait, decreased activity tolerance, decreased balance, decreased endurance, decreased mobility, difficulty walking, decreased ROM, decreased strength, hypomobility, increased edema, increased fascial restrictions, impaired perceived functional ability, increased muscle spasms, and pain.   ACTIVITY LIMITATIONS: carrying, lifting, bending, sitting, standing, squatting, sleeping, stairs, transfers, bed mobility, dressing, and locomotion level  PARTICIPATION LIMITATIONS: meal prep, cleaning, laundry, driving, shopping, community activity, and yard work  PERSONAL FACTORS: Age, Past/current experiences, Transportation, and 3+ comorbidities: recent abdominal surgery (10/06/23), degenerative disc disease of lumbar spine, chronic LBP  are also affecting  patient's functional outcome.   REHAB POTENTIAL: Good  CLINICAL DECISION MAKING: Evolving/moderate complexity  EVALUATION COMPLEXITY: Moderate   GOALS: Goals reviewed with patient? Yes   SHORT TERM GOALS: Target date: 03/03/2024  Independent with initial HEP. Baseline:  Goal status: MET 02/26/24  2.  Patient will have full right knee extension for safety with gait.  Baseline: lacking 10 deg.  Goal status: IN PROGRESS 02/26/24 lacking 6 deg.   LONG TERM GOALS: Target date: 03/31/2024   Independent with advanced/ongoing HEP to improve outcomes and carryover.  Baseline:  Goal status: IN PROGRESS  2.  Patient will demonstrate right knee flexion to 120 deg to ascend/descend stairs. Baseline: 90 Goal status: IN PROGRESS  3. Patient will be able to ambulate 600' safely without AD and normal gait pattern to access community.  Baseline: 2WRW Goal status: IN PROGRESS  4.  Patient will be able to ascend/descend stairs with 1 HR and reciprocal step pattern safely to access home and community.  Baseline: unable  Goal status: IN PROGRESS   5.  Patient will demonstrate > 19/24 on DGI to demonstrate decreased risk of falls.   Baseline: NT Goal status: IN PROGRESS  6.  Patient will demonstrate improved functional LE strength by completing 5x STS in <14 seconds.   Baseline: NT Goal status: IN PROGRESS  7.  Patient will demonstrate 40/80 on LEFS to demonstrate improved mobility.  Baseline: 11/80 Goal status: IN PROGRESS  PLAN:  PT FREQUENCY: 2-3x/week  PT DURATION: 6 weeks  PLANNED INTERVENTIONS: 97110-Therapeutic exercises, 97530- Therapeutic activity, 97112- Neuromuscular re-education, 97535- Self Care, 21308- Manual therapy, [phone]- Gait training, [phone]- Orthotic Fit/training, [phone]- Electrical stimulation (unattended), [phone]- Electrical stimulation (manual), 97016- Vasopneumatic device, Patient/Family education, Balance training, Stair training, Taping, Dry Needling, Joint  mobilization, Joint manipulation, Spinal manipulation, Spinal mobilization, Scar mobilization, Cryotherapy, and Moist heat  PLAN FOR NEXT SESSION: add hip strengthening exercises, ROM measurements weekly, progress strength, balance, gait as tolerated.  Modalities PRN.    Darleene Cleaver, PTA 02/29/2024, 2:41 PM

## 2024-03-02 ENCOUNTER — Ambulatory Visit: Payer: Medicare Other | Attending: Orthopedic Surgery

## 2024-03-02 DIAGNOSIS — M6281 Muscle weakness (generalized): Secondary | ICD-10-CM

## 2024-03-02 DIAGNOSIS — M25661 Stiffness of right knee, not elsewhere classified: Secondary | ICD-10-CM | POA: Diagnosis present

## 2024-03-02 DIAGNOSIS — R262 Difficulty in walking, not elsewhere classified: Secondary | ICD-10-CM | POA: Diagnosis present

## 2024-03-02 DIAGNOSIS — R600 Localized edema: Secondary | ICD-10-CM | POA: Diagnosis present

## 2024-03-02 DIAGNOSIS — M25561 Pain in right knee: Secondary | ICD-10-CM | POA: Diagnosis present

## 2024-03-02 NOTE — Therapy (Signed)
 OUTPATIENT PHYSICAL THERAPY LOWER EXTREMITY TREATMENT   Patient Name: Danielle Fitzpatrick MRN: [id number] DOB:1951/10/13, 73 y.o., female Today's Date: 03/02/2024  END OF SESSION:  PT End of Session - 03/02/24 1401     Visit Number 6    Date for PT Re-Evaluation 03/31/24    Authorization Type Medicare + [employer]    Progress Note Due on Visit 10    PT Start Time 1314    PT Stop Time 1405    PT Time Calculation (min) 51 min    Activity Tolerance Patient tolerated treatment well    Behavior During Therapy WFL for tasks assessed/performed                 Past Medical History:  Diagnosis Date   Allergy    Anxiety    Arthritis    Chicken pox    Depression    Diverticulitis    Diverticulosis    Hyperlipidemia    Past Surgical History:  Procedure Laterality Date   AUGMENTATION MAMMAPLASTY     LAPAROTOMY N/A 10/06/2023   Procedure: EXPLORATORY LAPAROTOMY;  Surgeon: Diamantina Monks, MD;  Location: [facility] OR;  Service: General;  Laterality: N/A;   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   Enthesopathy 02/09/2012   Other lymphedema 07/07/2011     PCP: Pearline Cables, MD   REFERRING PROVIDER: Beverely Low, MD  REFERRING DIAG:  [phone] (ICD-10-CM) - Other specified arthritis, right knee  Z96.651 (ICD-10-CM) - Total knee replacement status, right    THERAPY DIAG:  Acute pain of right knee  Stiffness of right knee, not elsewhere  classified  Muscle weakness (generalized)  Localized edema  Difficulty in walking, not elsewhere classified  Rationale for Evaluation and Treatment: Rehabilitation  ONSET DATE: DOS 02/15/2024  SUBJECTIVE:   SUBJECTIVE STATEMENT: "Got the staples out yesterday. Doctor seemed to be pleased."  NEXT MD VISIT: 30 days from last appointment  PERTINENT HISTORY: R knee OA, recent abdominal surgery (10/06/23), degenerative disc disease of lumbar spine, chronic LBP PAIN:  Are you having pain? Yes: NPRS scale: 4/10  Pain location: R knee Pain description: ache, post surgical  Aggravating factors: bending Relieving factors: tylenol, ice  PRECAUTIONS: Knee  RED FLAGS: None   WEIGHT BEARING RESTRICTIONS: No  FALLS:  Has patient fallen in last 6 months? No  LIVING ENVIRONMENT: Lives with: lives alone  adult children is assisting Lives in: House/apartment Stairs: No Has following equipment at home: Single point cane, Environmental consultant - 2 wheeled, and shower chair  OCCUPATION: retired  PLOF: Independent and Leisure: pilates and water aerobics   PATIENT GOALS: return to activities    OBJECTIVE:  Note: Objective measures were completed at Evaluation unless otherwise noted.  DIAGNOSTIC FINDINGS: DG lumbar spine 03/18/23 IMPRESSION: Progressive, severe degenerative disc disease at L4-5 and L5-S1 with unchanged grade 2 anterolisthesis at L4-L5 and moderate-severe facet arthropathy at these levels.   Unchanged moderate degenerative disc disease at L1-L2 and L2-L3.  PATIENT SURVEYS:  LEFS 11/80  COGNITION:  Overall cognitive status: Within functional limits for tasks assessed     SENSATION: WFL  EDEMA:  Circumferential:  knee L 32, R 36 cm thigh L 36 R 46 cm   MUSCLE LENGTH: NA  POSTURE: No Significant postural limitations  PALPATION: Negative squeeze test  LOWER EXTREMITY ROM:  Active ROM Right eval Left eval Right 02/26/24 R 02/29/24  Knee flexion 90 130 95 100  A/109 P  Knee extension Lacking 11 0 Lacking 6 4   (Blank rows = not tested)  LOWER EXTREMITY MMT:  MMT Right eval Left eval  Hip flexion 3 5  Hip extension    Hip abduction    Hip adduction    Knee flexion 3+ 5  Knee extension 2+ 25 deg knee lag 5  Ankle dorsiflexion 4 5  Ankle plantarflexion     (Blank rows = not tested)   GAIT: Distance walked: 50' Assistive device utilized: Environmental consultant - 2 wheeled Level of assistance: Modified independence Comments: 0.6 m/s, reciprocal step, decreased heel strike on Right.    TODAY'S TREATMENT:                                                                                                                              DATE: 03/02/24 Therapeutic Exercise: to improve strength and mobility.  Demo, verbal and tactile cues throughout for technique. Bike x 6 min full rev Seated gastroc stretch with strap 2x30" Runner stretch 2x30" Church pews x 10 Retro steps x 10 Standing hip abduction x 10 bil Standing hip extension x 10 bil Standing marching x 10 bil Seated R LAQ x 10 2lb Manual Therapy: to decrease muscle spasm, pain and improve mobility.  STM to R medial/lateral gastroc  Vasopneumatic: Gameready at end of session for post session soreness/edema.  10 min, med compression, 34 deg (coldest).  02/29/24 Therapeutic Exercise: to improve strength and mobility.  Demo, verbal and tactile cues throughout for technique. Bike partial revolutions x 6 min Seated R LAQ x 10 Seated marching x 10 bil Supine R thomas stretch with L SKTC x 1 min Long sitting heel slides AAROM 2x10 Supine R knee extension stretch w/ towel roll under heel Hamstring stretch seated 2x30" Gait Training With SPC- 90 ft x2 cues to increase knee flexion Vasopneumatic: Gameready at end of session for post session soreness/edema.  10 min, med compression, 34 deg (coldest).  02/26/24 Therapeutic Exercise: to improve strength and mobility.  Demo, verbal and tactile cues throughout  for technique. Bike partial revolutions x 7 min  SAQ x 15 SLR x 10  Heel slides x 10  Bridge x 4 - painful -discontinued Gait Training With SPC, checked height of cane, reviewed use of cane, tried both hands, R hand most comfortable for knee, gait 2 x 50' with cues for placement.  Manual Therapy: to decrease muscle spasm and pain and improve mobility STM/TPR to R glute, STM/TPR to R hamstrings, skilled palpation and monitoring during dry  needling. Trigger Point Dry Needling  Initial Treatment: Pt instructed on Dry Needling rational, procedures, and possible side effects. Pt instructed to expect mild to moderate muscle soreness later in the day and/or into the next day.  Pt instructed in methods to reduce muscle soreness. Pt instructed to continue prescribed HEP. Patient was educated on signs and symptoms of infection and other risk factors and advised to seek medical attention should they occur.  Patient verbalized understanding of these instructions and education.   Patient Verbal Consent Given: Yes Education Handout Provided: Yes Muscles Treated: R glut max Electrical Stimulation Performed: No Treatment Response/Outcome: Twitch Response Elicited and Palpable Increase in Muscle Length  Vasopneumatic: Gameready at end of session for post session soreness/edema.  10 min, low compression, 34 deg (coldest).    02/24/24 Therapeutic Exercise: to improve strength, ROM, flexibility, and endurance  Nustep L5x57min UE/LE  Rec Bike Partial rev x 2 min Supine QS - cues to avoid engaging glutes towel under knee Supine SLR x 10 Thomas stretch x 1 min R side Seated R LAQ x 10  GAIT TRAINING: To normalize gait pattern and improve safety. Cues to avoid R hip circumduction, to increase heel strike, increase knee flexion ; 270 feet with RW supervision  Manual Therapy: to decrease muscle spasm, pain and improve mobility.  STM to R glute med/min, lumbar paraspinals in sidelying  Game Ready x 10  min R knee med compression 34 deg  02/22/24 Therapeutic Exercise: to improve strength, ROM, flexibility, and endurance  Nustep L5x43min UE/LE Seated heel slides 2x10 Seated LAQ x 10 RLE- cues to avoid hip flexion Supine R QS 10x5"; 2 sets towel under knee Supine heel slides 2x10 Standing ant/post x 10   GAIT TRAINING: To normalize gait pattern and improve safety. With rolling walker; supervision cues to increase heel strike and hip/knee flexion; unsteady with SPC today  Game Ready x 10 min R knee med compression 34 deg       PATIENT EDUCATION:  Education details: HEP Person educated: Patient Education method: Chief Technology Officer Education comprehension: verbalized understanding  HOME EXERCISE PROGRAM: Access Code: [email or web address] URL: [email or web address] Date: 03/02/2024 Prepared by: Verta Ellen  Exercises - Seated Knee Flexion Slide  - 3 x daily - 7 x weekly - 1 sets - 10 reps - Seated Heel Slide  - 1 x daily - 7 x weekly - 3 sets - 10 reps - Seated Long Arc Quad  - 3 x daily - 7 x weekly - 1 sets - 10 reps - Supine Quad Set on Towel Roll  - 3 x daily - 7 x weekly - 1 sets - 10 reps - Seated Quad Set  - 3 x daily - 7 x weekly - 1 sets - 10 reps - Modified Thomas Stretch  - 1 x daily - 7 x weekly - 2 sets - 2 reps - 1 min hold - Standing Hip Abduction with Counter Support  - 1 x daily - 7 x weekly - 2 sets - 10 reps - Standing Hip Extension with Counter Support  - 1 x daily - 7 x weekly - 2 sets - 10 reps - Standing March with Counter Support  - 1 x daily - 7 x weekly - 2 sets - 10 reps - Gastroc Stretch on Wall  - 1 x daily - 7 x weekly - 2 sets - 2 reps - 30 seconds hold  ASSESSMENT:  CLINICAL IMPRESSION: Pt notes a good report from her doctor. She  has a pretty tight spot over her medial gastroc with some bruising still present. She responded well to the progression of her exercises. Worked on improving weight acceptance on her R LE with standing  exercises. Concluded session with GR for swelling.  Thomos Lemons continues to demonstrate potential for improvement and would benefit from continued skilled therapy to address impairments.      OBJECTIVE IMPAIRMENTS: Abnormal gait, decreased activity tolerance, decreased balance, decreased endurance, decreased mobility, difficulty walking, decreased ROM, decreased strength, hypomobility, increased edema, increased fascial restrictions, impaired perceived functional ability, increased muscle spasms, and pain.   ACTIVITY LIMITATIONS: carrying, lifting, bending, sitting, standing, squatting, sleeping, stairs, transfers, bed mobility, dressing, and locomotion level  PARTICIPATION LIMITATIONS: meal prep, cleaning, laundry, driving, shopping, community activity, and yard work  PERSONAL FACTORS: Age, Past/current experiences, Transportation, and 3+ comorbidities: recent abdominal surgery (10/06/23), degenerative disc disease of lumbar spine, chronic LBP  are also affecting patient's functional outcome.   REHAB POTENTIAL: Good  CLINICAL DECISION MAKING: Evolving/moderate complexity  EVALUATION COMPLEXITY: Moderate   GOALS: Goals reviewed with patient? Yes   SHORT TERM GOALS: Target date: 03/03/2024    Independent with initial HEP. Baseline:  Goal status: MET 02/26/24  2.  Patient will have full right knee extension for safety with gait.  Baseline: lacking 10 deg.  Goal status: IN PROGRESS 02/26/24 lacking 6 deg.   LONG TERM GOALS: Target date: 03/31/2024   Independent with advanced/ongoing HEP to improve outcomes and carryover.  Baseline:  Goal status: IN PROGRESS  2.  Patient will demonstrate right knee flexion to 120 deg to ascend/descend stairs. Baseline: 90 Goal status: IN PROGRESS  3. Patient will be able to ambulate 600' safely without AD and normal gait pattern to access community.  Baseline: 2WRW Goal status: IN PROGRESS  4.  Patient will be able to ascend/descend stairs  with 1 HR and reciprocal step pattern safely to access home and community.  Baseline: unable  Goal status: IN PROGRESS   5.  Patient will demonstrate > 19/24 on DGI to demonstrate decreased risk of falls.   Baseline: NT Goal status: IN PROGRESS  6.  Patient will demonstrate improved functional LE strength by completing 5x STS in <14 seconds.   Baseline: NT Goal status: IN PROGRESS  7.  Patient will demonstrate 40/80 on LEFS to demonstrate improved mobility.  Baseline: 11/80 Goal status: IN PROGRESS  PLAN:  PT FREQUENCY: 2-3x/week  PT DURATION: 6 weeks  PLANNED INTERVENTIONS: 97110-Therapeutic exercises, 97530- Therapeutic activity, 97112- Neuromuscular re-education, 97535- Self Care, 16109- Manual therapy, [phone]- Gait training, [phone]- Orthotic Fit/training, [phone]- Electrical stimulation (unattended), [phone]- Electrical stimulation (manual), 97016- Vasopneumatic device, Patient/Family education, Balance training, Stair training, Taping, Dry Needling, Joint mobilization, Joint manipulation, Spinal manipulation, Spinal mobilization, Scar mobilization, Cryotherapy, and Moist heat  PLAN FOR NEXT SESSION: add hip strengthening exercises, ROM measurements weekly, progress strength, balance, gait as tolerated.  Modalities PRN.    Darleene Cleaver, PTA 03/02/2024, 2:01 PM

## 2024-03-04 ENCOUNTER — Ambulatory Visit: Payer: Medicare Other

## 2024-03-04 DIAGNOSIS — M25561 Pain in right knee: Secondary | ICD-10-CM

## 2024-03-04 DIAGNOSIS — M6281 Muscle weakness (generalized): Secondary | ICD-10-CM

## 2024-03-04 DIAGNOSIS — R262 Difficulty in walking, not elsewhere classified: Secondary | ICD-10-CM

## 2024-03-04 DIAGNOSIS — M25661 Stiffness of right knee, not elsewhere classified: Secondary | ICD-10-CM

## 2024-03-04 DIAGNOSIS — R600 Localized edema: Secondary | ICD-10-CM

## 2024-03-04 NOTE — Therapy (Signed)
 OUTPATIENT PHYSICAL THERAPY LOWER EXTREMITY TREATMENT   Patient Name: Danielle Fitzpatrick MRN: [id number] DOB:1951-08-17, 73 y.o., female Today's Date: 03/04/2024  END OF SESSION:  PT End of Session - 03/04/24 1058     Visit Number 7    Date for PT Re-Evaluation 03/31/24    Authorization Type Medicare + [employer]    Progress Note Due on Visit 10    PT Start Time 1055    PT Stop Time 1203    PT Time Calculation (min) 68 min    Activity Tolerance Patient tolerated treatment well    Behavior During Therapy WFL for tasks assessed/performed                  Past Medical History:  Diagnosis Date   Allergy    Anxiety    Arthritis    Chicken pox    Depression    Diverticulitis    Diverticulosis    Hyperlipidemia    Past Surgical History:  Procedure Laterality Date   AUGMENTATION MAMMAPLASTY     LAPAROTOMY N/A 10/06/2023   Procedure: EXPLORATORY LAPAROTOMY;  Surgeon: Diamantina Monks, MD;  Location: [facility] OR;  Service: General;  Laterality: N/A;   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   Enthesopathy 02/09/2012   Other lymphedema 07/07/2011     PCP: Pearline Cables, MD   REFERRING PROVIDER: Beverely Low, MD  REFERRING DIAG:  [phone] (ICD-10-CM) - Other specified arthritis, right knee  Z96.651 (ICD-10-CM) - Total knee replacement status, right    THERAPY DIAG:  Acute pain of right knee  Stiffness of right knee, not elsewhere  classified  Muscle weakness (generalized)  Localized edema  Difficulty in walking, not elsewhere classified  Rationale for Evaluation and Treatment: Rehabilitation  ONSET DATE: DOS 02/15/2024  SUBJECTIVE:   SUBJECTIVE STATEMENT: " I was sore in the thigh, calf, and hip after last session."  NEXT MD VISIT: 30 days from last appointment  PERTINENT HISTORY: R knee OA, recent abdominal surgery (10/06/23), degenerative disc disease of lumbar spine, chronic LBP PAIN:  Are you having pain? Yes: NPRS scale: 3/10  Pain location: R knee Pain description: ache, post surgical  Aggravating factors: bending Relieving factors: tylenol, ice  PRECAUTIONS: Knee  RED FLAGS: None   WEIGHT BEARING RESTRICTIONS: No  FALLS:  Has patient fallen in last 6 months? No  LIVING ENVIRONMENT: Lives with: lives alone  adult children is assisting Lives in: House/apartment Stairs: No Has following equipment at home: Single point cane, Environmental consultant - 2 wheeled, and shower chair  OCCUPATION: retired  PLOF: Independent and Leisure: pilates and water aerobics   PATIENT GOALS: return to activities    OBJECTIVE:  Note: Objective measures were completed at Evaluation unless otherwise noted.  DIAGNOSTIC FINDINGS: DG lumbar spine 03/18/23 IMPRESSION: Progressive, severe degenerative disc disease at L4-5 and L5-S1 with unchanged grade 2 anterolisthesis at L4-L5 and moderate-severe facet arthropathy at these levels.   Unchanged moderate degenerative disc disease at L1-L2 and L2-L3.  PATIENT SURVEYS:  LEFS 11/80  COGNITION: Overall cognitive status: Within functional limits for tasks assessed     SENSATION: WFL  EDEMA:  Circumferential:  knee L 32, R 36 cm thigh L 36 R 46 cm   MUSCLE LENGTH: NA  POSTURE: No Significant postural limitations  PALPATION: Negative squeeze test  LOWER EXTREMITY ROM:  Active ROM Right eval Left eval Right 02/26/24 R 02/29/24  Knee flexion 90 130 95 100  A/109 P  Knee extension Lacking 11 0 Lacking 6 4   (Blank rows = not tested)  LOWER EXTREMITY MMT:  MMT Right eval Left eval  Hip flexion 3 5  Hip extension    Hip abduction    Hip adduction    Knee flexion 3+ 5  Knee extension 2+ 25 deg knee lag 5  Ankle dorsiflexion 4 5  Ankle plantarflexion     (Blank rows = not tested)   GAIT: Distance walked: 50' Assistive device utilized: Environmental consultant - 2 wheeled Level of assistance: Modified independence Comments: 0.6 m/s, reciprocal step, decreased heel strike on Right.    TODAY'S TREATMENT:                                                                                                                              DATE: 03/04/24 Therapeutic Exercise: to improve strength and mobility.  Demo, verbal and tactile cues throughout for technique. Bike x 6 min full rev Supine quad stretch with strap - pt reported too much pain Supine ITB stretch with strap x 30" Therapeutic Activity: to improve functional performance. Stairs: reviewed up with good/down with bad prinicpal Standing heel raises 2x10 Runner stretch and gatsroc stretch with strap x 1 min Step ups 4' x 10 RLE Lateral step ups 2' x 10 RLE  Manual Therapy: to decrease muscle spasm, pain and improve mobility IASTM to R quads, ITB with s/s hand grip tool  Vasopneumatic: Gameready at end of session for post session soreness/edema.  10 min, med compression, 34 deg (coldest).  03/02/24 Therapeutic Exercise: to improve strength and mobility.  Demo, verbal and tactile cues throughout for technique. Bike x 6 min full rev Seated gastroc stretch with strap 2x30" Runner stretch 2x30" Church pews x 10 Retro steps x 10 Standing hip abduction x 10 bil Standing hip extension x 10 bil Standing marching x 10 bil Seated R LAQ x 10 2lb Manual Therapy: to decrease muscle spasm, pain and improve mobility.  STM to R medial/lateral gastroc  Vasopneumatic: Gameready at end of session for post  session soreness/edema.  10 min, med compression, 34 deg (coldest).  02/29/24 Therapeutic Exercise: to improve strength and mobility.  Demo, verbal and tactile cues throughout for technique. Bike partial revolutions x 6 min Seated R LAQ x 10 Seated marching x 10 bil Supine R thomas stretch with L SKTC x 1 min Long sitting heel slides AAROM 2x10 Supine R knee extension stretch w/ towel roll under heel Hamstring stretch seated 2x30" Gait  Training With SPC- 90 ft x2 cues to increase knee flexion Vasopneumatic: Gameready at end of session for post session soreness/edema.  10 min, med compression, 34 deg (coldest).  02/26/24 Therapeutic Exercise: to improve strength and mobility.  Demo, verbal and tactile cues throughout for technique. Bike partial revolutions x 7 min  SAQ x 15 SLR x 10  Heel slides x 10  Bridge x 4 - painful -discontinued Gait Training With SPC, checked height of cane, reviewed use of cane, tried both hands, R hand most comfortable for knee, gait 2 x 50' with cues for placement.  Manual Therapy: to decrease muscle spasm and pain and improve mobility STM/TPR to R glute, STM/TPR to R hamstrings, skilled palpation and monitoring during dry needling. Trigger Point Dry Needling  Initial Treatment: Pt instructed on Dry Needling rational, procedures, and possible side effects. Pt instructed to expect mild to moderate muscle soreness later in the day and/or into the next day.  Pt instructed in methods to reduce muscle soreness. Pt instructed to continue prescribed HEP. Patient was educated on signs and symptoms of infection and other risk factors and advised to seek medical attention should they occur.  Patient verbalized understanding of these instructions and education.   Patient Verbal Consent Given: Yes Education Handout Provided: Yes Muscles Treated: R glut max Electrical Stimulation Performed: No Treatment Response/Outcome: Twitch Response Elicited and Palpable Increase  in Muscle Length  Vasopneumatic: Gameready at end of session for post session soreness/edema.  10 min, low compression, 34 deg (coldest).    02/24/24 Therapeutic Exercise: to improve strength, ROM, flexibility, and endurance  Nustep L5x24min UE/LE  Rec Bike Partial rev x 2 min Supine QS - cues to avoid engaging glutes towel under knee Supine SLR x 10 Thomas stretch x 1 min R side Seated R LAQ x 10  GAIT TRAINING: To normalize gait pattern and improve safety. Cues to avoid R hip circumduction, to increase heel strike, increase knee flexion ; 270 feet with RW supervision  Manual Therapy: to decrease muscle spasm, pain and improve mobility.  STM to R glute med/min, lumbar paraspinals in sidelying  Game Ready x 10 min R knee med compression 34 deg  02/22/24 Therapeutic Exercise: to improve strength, ROM, flexibility, and endurance  Nustep L5x36min UE/LE Seated heel slides 2x10 Seated LAQ x 10 RLE- cues to avoid hip flexion Supine R QS 10x5"; 2 sets towel under knee Supine heel slides 2x10 Standing ant/post x 10   GAIT TRAINING: To normalize gait pattern and improve safety. With rolling walker; supervision cues to increase heel strike and hip/knee flexion; unsteady with SPC today  Game Ready x 10 min R knee med compression 34 deg       PATIENT EDUCATION:  Education details: HEP Person educated: Patient Education method: Chief Technology Officer Education comprehension: verbalized understanding  HOME EXERCISE PROGRAM: Access Code: [email or web address] URL: [email or web address] Date: 03/02/2024 Prepared by: Verta Ellen  Exercises - Seated Knee Flexion Slide  - 3 x daily - 7 x weekly - 1 sets - 10 reps - Seated Heel Slide  - 1 x daily - 7 x weekly - 3 sets - 10 reps - Seated Long Arc Quad  - 3 x daily - 7 x weekly - 1 sets - 10 reps - Supine Quad Set on Towel Roll  - 3 x daily - 7 x weekly - 1 sets - 10 reps - Seated Quad Set  - 3 x daily - 7 x weekly - 1 sets - 10  reps - Modified Thomas Stretch  - 1 x daily - 7 x weekly - 2 sets - 2 reps - 1 min hold - Standing Hip Abduction with Counter Support  - 1 x daily - 7 x weekly - 2 sets - 10 reps - Standing Hip Extension with Counter Support  - 1 x daily - 7 x weekly - 2 sets - 10 reps - Standing March with Counter Support  - 1 x daily - 7 x weekly - 2 sets - 10 reps - Gastroc Stretch on Wall  - 1 x daily - 7 x weekly - 2 sets - 2 reps - 30 seconds hold  ASSESSMENT:  CLINICAL IMPRESSION: Reviewed stairs with patient to ensure safety and proper navigation. Worked on calf strengthening to assist with ability to drive her car in future. Worked on stair stepping with RLE, cues needed to avoid hip compensations with lateral step ups. She was tight in the R VL, RF, and ITB, many TrPs palpated, post manual she noted less pain and tension. Concluded session with GR for swelling.  Danielle Fitzpatrick continues to demonstrate potential for improvement and would benefit from continued skilled therapy to address impairments.      OBJECTIVE IMPAIRMENTS: Abnormal gait, decreased activity tolerance, decreased balance, decreased endurance, decreased mobility, difficulty walking, decreased ROM, decreased strength, hypomobility, increased edema, increased fascial restrictions, impaired perceived functional ability, increased muscle spasms, and pain.   ACTIVITY LIMITATIONS: carrying, lifting, bending, sitting, standing, squatting, sleeping, stairs, transfers, bed mobility, dressing, and locomotion level  PARTICIPATION LIMITATIONS: meal prep, cleaning, laundry, driving, shopping, community activity, and yard work  PERSONAL FACTORS: Age, Past/current experiences, Transportation, and 3+ comorbidities: recent abdominal surgery (10/06/23), degenerative disc disease of lumbar spine, chronic LBP  are also affecting patient's functional outcome.   REHAB POTENTIAL: Good  CLINICAL DECISION MAKING: Evolving/moderate  complexity  EVALUATION COMPLEXITY: Moderate   GOALS: Goals reviewed with patient? Yes   SHORT TERM GOALS: Target date: 03/03/2024    Independent with initial HEP. Baseline:  Goal status: MET 02/26/24  2.  Patient will have full right knee extension for safety with gait.  Baseline: lacking 10 deg.  Goal status: IN PROGRESS 02/26/24 lacking 6 deg.   LONG TERM GOALS: Target date: 03/31/2024   Independent with advanced/ongoing HEP to improve outcomes and carryover.  Baseline:  Goal status: IN PROGRESS  2.  Patient will demonstrate right knee flexion to 120 deg to ascend/descend stairs. Baseline: 90 Goal status: IN PROGRESS  3. Patient will be able to ambulate 600' safely without AD and normal gait pattern to access community.  Baseline: 2WRW Goal status: IN PROGRESS  4.  Patient will be able to ascend/descend stairs with 1 HR and reciprocal step pattern safely to access home and community.  Baseline: unable  Goal status: IN PROGRESS   5.  Patient will demonstrate > 19/24 on DGI to demonstrate decreased risk of falls.   Baseline: NT Goal status: IN PROGRESS  6.  Patient will demonstrate improved functional LE strength by completing 5x STS in <14 seconds.   Baseline: NT Goal status: IN PROGRESS  7.  Patient will demonstrate 40/80 on LEFS to demonstrate improved mobility.  Baseline: 11/80 Goal status: IN PROGRESS  PLAN:  PT FREQUENCY: 2-3x/week  PT DURATION: 6 weeks  PLANNED INTERVENTIONS: 97110-Therapeutic exercises, 97530- Therapeutic activity, [id number]- Neuromuscular re-education, 97535- Self Care, [id number]- Manual therapy, [phone]- Gait training, [phone]- Orthotic Fit/training, [phone]- Electrical stimulation (unattended), [phone]- Electrical stimulation (manual), [id number]- Vasopneumatic device, Patient/Family education,  Balance training, Stair training, Taping, Dry Needling, Joint mobilization, Joint manipulation, Spinal manipulation, Spinal mobilization, Scar mobilization, Cryotherapy,  and Moist heat  PLAN FOR NEXT SESSION: add hip strengthening exercises, ROM measurements weekly, progress strength, balance, gait as tolerated.  Modalities PRN.    Darleene Cleaver, PTA 03/04/2024, 12:01 PM

## 2024-03-07 ENCOUNTER — Ambulatory Visit: Payer: Medicare Other

## 2024-03-07 DIAGNOSIS — M6281 Muscle weakness (generalized): Secondary | ICD-10-CM

## 2024-03-07 DIAGNOSIS — M25661 Stiffness of right knee, not elsewhere classified: Secondary | ICD-10-CM

## 2024-03-07 DIAGNOSIS — R600 Localized edema: Secondary | ICD-10-CM

## 2024-03-07 DIAGNOSIS — M25561 Pain in right knee: Secondary | ICD-10-CM | POA: Diagnosis not present

## 2024-03-07 DIAGNOSIS — R262 Difficulty in walking, not elsewhere classified: Secondary | ICD-10-CM

## 2024-03-07 NOTE — Therapy (Signed)
 OUTPATIENT PHYSICAL THERAPY LOWER EXTREMITY TREATMENT   Patient Name: Danielle Fitzpatrick MRN: [id number] DOB:09/21/1951, 73 y.o., female Today's Date: 03/07/2024  END OF SESSION:  PT End of Session - 03/07/24 1336     Visit Number 8    Date for PT Re-Evaluation 03/31/24    Authorization Type Medicare + [employer]    Progress Note Due on Visit 10    PT Start Time 1310    PT Stop Time 1407    PT Time Calculation (min) 57 min    Activity Tolerance Patient tolerated treatment well    Behavior During Therapy WFL for tasks assessed/performed                   Past Medical History:  Diagnosis Date   Allergy    Anxiety    Arthritis    Chicken pox    Depression    Diverticulitis    Diverticulosis    Hyperlipidemia    Past Surgical History:  Procedure Laterality Date   AUGMENTATION MAMMAPLASTY     LAPAROTOMY N/A 10/06/2023   Procedure: EXPLORATORY LAPAROTOMY;  Surgeon: Diamantina Monks, MD;  Location: [facility] OR;  Service: General;  Laterality: N/A;   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   Enthesopathy 02/09/2012   Other lymphedema 07/07/2011     PCP: Pearline Cables, MD   REFERRING PROVIDER: Beverely Low, MD  REFERRING DIAG:  [phone] (ICD-10-CM) - Other specified arthritis, right knee  Z96.651 (ICD-10-CM) - Total knee replacement status, right    THERAPY DIAG:  Acute pain of right knee  Stiffness of right knee, not elsewhere  classified  Muscle weakness (generalized)  Localized edema  Difficulty in walking, not elsewhere classified  Rationale for Evaluation and Treatment: Rehabilitation  ONSET DATE: DOS 02/15/2024  SUBJECTIVE:   SUBJECTIVE STATEMENT: Pt reports that she sleep very well on Saturday night.   NEXT MD VISIT: 30 days from last appointment  PERTINENT HISTORY: R knee OA, recent abdominal surgery (10/06/23), degenerative disc disease of lumbar spine, chronic LBP PAIN:  Are you having pain? Yes: NPRS scale: 2/10  Pain location: R knee Pain description: ache, post surgical  Aggravating factors: bending Relieving factors: tylenol, ice  PRECAUTIONS: Knee  RED FLAGS: None   WEIGHT BEARING RESTRICTIONS: No  FALLS:  Has patient fallen in last 6 months? No  LIVING ENVIRONMENT: Lives with: lives alone  adult children is assisting Lives in: House/apartment Stairs: No Has following equipment at home: Single point cane, Environmental consultant - 2 wheeled, and shower chair  OCCUPATION: retired  PLOF: Independent and Leisure: pilates and water aerobics   PATIENT GOALS: return to activities    OBJECTIVE:  Note: Objective measures were completed at Evaluation unless otherwise noted.  DIAGNOSTIC FINDINGS: DG lumbar spine 03/18/23 IMPRESSION: Progressive, severe degenerative disc disease at L4-5 and L5-S1 with unchanged grade 2 anterolisthesis at L4-L5 and moderate-severe facet arthropathy at these levels.   Unchanged moderate degenerative disc disease at L1-L2 and L2-L3.  PATIENT SURVEYS:  LEFS  11/80  COGNITION: Overall cognitive status: Within functional limits for tasks assessed     SENSATION: WFL  EDEMA:  Circumferential:  knee L 32, R 36 cm thigh L 36 R 46 cm   MUSCLE LENGTH: NA  POSTURE: No Significant postural limitations  PALPATION: Negative squeeze test  LOWER EXTREMITY ROM:  Active ROM Right eval Left eval Right 02/26/24 R 02/29/24 R 03/07/24  Knee flexion 90 130 95 100  A/109 P 105 A  Knee extension Lacking 11 0 Lacking 6 4 2  A   (Blank rows = not tested)  LOWER EXTREMITY MMT:  MMT Right eval Left eval  Hip flexion 3 5  Hip extension    Hip abduction    Hip adduction    Knee flexion 3+ 5  Knee extension 2+ 25 deg knee lag 5  Ankle dorsiflexion 4 5  Ankle plantarflexion     (Blank rows = not tested)   GAIT: Distance walked: 50' Assistive device utilized: Environmental consultant - 2 wheeled Level of assistance: Modified independence Comments: 0.6 m/s, reciprocal step, decreased heel strike on Right.    TODAY'S TREATMENT:                                                                                                                              DATE: 03/07/24 Therapeutic Exercise: to improve strength and mobility.  Demo, verbal and tactile cues throughout for technique. Bike x 6 min full rev  Neuromuscular Re-education: Step ups 4' 2 x 10 RLE Lateral step ups 4' 2 x 10 RLE Standing hip abduction x 10 bil 2lb Standing hip extension x 10 bil 2lb Standing marching x 10 bil 2lb Standing HS curls x 10 bil 2lb  Manual Therapy: to decrease muscle spasm, pain and improve mobility.  IASTM to R ITB, VL in S/L with edge tool Patellar mobs, scar tissue mobilization demo  Vasopneumatic: Gameready at end of session for post session soreness/edema.  10 min, med compression, 34 deg (coldest).   03/04/24 Therapeutic Exercise: to improve strength and mobility.  Demo, verbal and tactile cues throughout for technique. Bike x 6 min full rev Supine quad stretch with strap - pt reported too much pain Supine ITB stretch with strap x 30" Therapeutic Activity: to improve functional performance. Stairs: reviewed up with good/down with bad prinicpal Standing heel raises 2x10 Runner stretch and gatsroc stretch with strap x 1 min Step ups 4' x 10 RLE Lateral step ups 2' x 10 RLE  Manual Therapy: to decrease muscle spasm, pain and improve mobility IASTM to R quads, ITB with  s/s hand grip tool  Vasopneumatic: Gameready at end of session for post session soreness/edema.  10 min, med compression, 34 deg (coldest).  03/02/24 Therapeutic Exercise: to improve strength and mobility.  Demo, verbal and tactile cues throughout for technique. Bike x 6 min full rev Seated gastroc stretch with strap 2x30" Runner stretch 2x30" [employer] pews x 10 Retro steps x 10 Standing  hip abduction x 10 bil Standing hip extension x 10 bil Standing marching x 10 bil Seated R LAQ x 10 2lb Manual Therapy: to decrease muscle spasm, pain and improve mobility.  STM to R medial/lateral gastroc  Vasopneumatic: Gameready at end of session for post session soreness/edema.  10 min, med compression, 34 deg (coldest).  02/29/24 Therapeutic Exercise: to improve strength and mobility.  Demo, verbal and tactile cues throughout for technique. Bike partial revolutions x 6 min Seated R LAQ x 10 Seated marching x 10 bil Supine R thomas stretch with L SKTC x 1 min Long sitting heel slides AAROM 2x10 Supine R knee extension stretch w/ towel roll under heel Hamstring stretch seated 2x30" Gait Training With SPC- 90 ft x2 cues to increase knee flexion Vasopneumatic: Gameready at end of session for post session soreness/edema.  10 min, med compression, 34 deg (coldest).  02/26/24 Therapeutic Exercise: to improve strength and mobility.  Demo, verbal and tactile cues throughout for technique. Bike partial revolutions x 7 min  SAQ x 15 SLR x 10  Heel slides x 10  Bridge x 4 - painful -discontinued Gait Training With SPC, checked height of cane, reviewed use of cane, tried both hands, R hand most comfortable for knee, gait 2 x 50' with cues for placement.  Manual Therapy: to decrease muscle spasm and pain and improve mobility STM/TPR to R glute, STM/TPR to R hamstrings, skilled palpation and monitoring during dry needling. Trigger Point Dry Needling  Initial Treatment: Pt instructed on Dry Needling  rational, procedures, and possible side effects. Pt instructed to expect mild to moderate muscle soreness later in the day and/or into the next day.  Pt instructed in methods to reduce muscle soreness. Pt instructed to continue prescribed HEP. Patient was educated on signs and symptoms of infection and other risk factors and advised to seek medical attention should they occur.  Patient verbalized understanding of these instructions and education.   Patient Verbal Consent Given: Yes Education Handout Provided: Yes Muscles Treated: R glut max Electrical Stimulation Performed: No Treatment Response/Outcome: Twitch Response Elicited and Palpable Increase in Muscle Length  Vasopneumatic: Gameready at end of session for post session soreness/edema.  10 min, low compression, 34 deg (coldest).    02/24/24 Therapeutic Exercise: to improve strength, ROM, flexibility, and endurance  Nustep L5x8min UE/LE  Rec Bike Partial rev x 2 min Supine QS - cues to avoid engaging glutes towel under knee Supine SLR x 10 Thomas stretch x 1 min R side Seated R LAQ x 10  GAIT TRAINING: To normalize gait pattern and improve safety. Cues to avoid R hip circumduction, to increase heel strike, increase knee flexion ; 270 feet with RW supervision  Manual Therapy: to decrease muscle spasm, pain and improve mobility.  STM to R glute med/min, lumbar paraspinals in sidelying  Game Ready x 10 min R knee med compression 34 deg  02/22/24 Therapeutic Exercise: to improve strength, ROM, flexibility, and endurance  Nustep L5x72min UE/LE Seated heel slides 2x10 Seated LAQ x 10 RLE- cues to avoid hip flexion Supine R QS 10x5"; 2 sets towel under knee Supine heel slides 2x10 Standing ant/post x 10   GAIT TRAINING: To normalize gait pattern and improve safety. With rolling walker; supervision cues to increase heel strike and hip/knee flexion; unsteady with SPC today  Game Ready x 10 min R knee med compression 34 deg        PATIENT EDUCATION:  Education details: HEP Person educated: Patient Education method:  Explanation and Handouts Education comprehension: verbalized understanding  HOME EXERCISE PROGRAM: Access Code: [email or web address] URL: [email or web address] Date: 03/02/2024 Prepared by: Verta Ellen  Exercises - Seated Knee Flexion Slide  - 3 x daily - 7 x weekly - 1 sets - 10 reps - Seated Heel Slide  - 1 x daily - 7 x weekly - 3 sets - 10 reps - Seated Long Arc Quad  - 3 x daily - 7 x weekly - 1 sets - 10 reps - Supine Quad Set on Towel Roll  - 3 x daily - 7 x weekly - 1 sets - 10 reps - Seated Quad Set  - 3 x daily - 7 x weekly - 1 sets - 10 reps - Modified Thomas Stretch  - 1 x daily - 7 x weekly - 2 sets - 2 reps - 1 min hold - Standing Hip Abduction with Counter Support  - 1 x daily - 7 x weekly - 2 sets - 10 reps - Standing Hip Extension with Counter Support  - 1 x daily - 7 x weekly - 2 sets - 10 reps - Standing March with Counter Support  - 1 x daily - 7 x weekly - 2 sets - 10 reps - Gastroc Stretch on Wall  - 1 x daily - 7 x weekly - 2 sets - 2 reps - 30 seconds hold  ASSESSMENT:  CLINICAL IMPRESSION: Pt arrives continuing to do well and progress with R knee ROM. We are working on motor control with stair navigation using RLE and single leg stability with standing hip exercises. Patellar mobility and scar mobility was good. She does have a hard lump behind her medial knee that she reports has not gone down, I suggested she reach out to her MD about this. Overall she is doing well.   Thomos Lemons continues to demonstrate potential for improvement and would benefit from continued skilled therapy to address impairments.      OBJECTIVE IMPAIRMENTS: Abnormal gait, decreased activity tolerance, decreased balance, decreased endurance, decreased mobility, difficulty walking, decreased ROM, decreased strength, hypomobility, increased edema, increased fascial restrictions,  impaired perceived functional ability, increased muscle spasms, and pain.   ACTIVITY LIMITATIONS: carrying, lifting, bending, sitting, standing, squatting, sleeping, stairs, transfers, bed mobility, dressing, and locomotion level  PARTICIPATION LIMITATIONS: meal prep, cleaning, laundry, driving, shopping, community activity, and yard work  PERSONAL FACTORS: Age, Past/current experiences, Transportation, and 3+ comorbidities: recent abdominal surgery (10/06/23), degenerative disc disease of lumbar spine, chronic LBP  are also affecting patient's functional outcome.   REHAB POTENTIAL: Good  CLINICAL DECISION MAKING: Evolving/moderate complexity  EVALUATION COMPLEXITY: Moderate   GOALS: Goals reviewed with patient? Yes   SHORT TERM GOALS: Target date: 03/03/2024    Independent with initial HEP. Baseline:  Goal status: MET 02/26/24  2.  Patient will have full right knee extension for safety with gait.  Baseline: lacking 10 deg.  Goal status: IN PROGRESS 03/07/24- 2 deg of extension  LONG TERM GOALS: Target date: 03/31/2024   Independent with advanced/ongoing HEP to improve outcomes and carryover.  Baseline:  Goal status: IN PROGRESS  2.  Patient will demonstrate right knee flexion to 120 deg to ascend/descend stairs. Baseline: 90 Goal status: IN PROGRESS- 03/07/24 105 deg  3. Patient will be able to ambulate 600' safely without AD and normal gait pattern to access community.  Baseline: 2WRW Goal status: IN PROGRESS  4.  Patient will be able to ascend/descend stairs with 1 HR and reciprocal step pattern safely  to access home and community.  Baseline: unable  Goal status: IN PROGRESS   5.  Patient will demonstrate > 19/24 on DGI to demonstrate decreased risk of falls.   Baseline: NT Goal status: IN PROGRESS  6.  Patient will demonstrate improved functional LE strength by completing 5x STS in <14 seconds.   Baseline: NT Goal status: IN PROGRESS  7.  Patient will demonstrate 40/80  on LEFS to demonstrate improved mobility.  Baseline: 11/80 Goal status: IN PROGRESS  PLAN:  PT FREQUENCY: 2-3x/week  PT DURATION: 6 weeks  PLANNED INTERVENTIONS: 97110-Therapeutic exercises, 97530- Therapeutic activity, 97112- Neuromuscular re-education, [phone]- Self Care, 69629- Manual therapy, [phone]- Gait training, [phone]- Orthotic Fit/training, [phone]- Electrical stimulation (unattended), [phone]- Electrical stimulation (manual), [id number]- Vasopneumatic device, Patient/Family education, Balance training, Stair training, Taping, Dry Needling, Joint mobilization, Joint manipulation, Spinal manipulation, Spinal mobilization, Scar mobilization, Cryotherapy, and Moist heat  PLAN FOR NEXT SESSION: progress strength, balance, gait as tolerated.  Modalities PRN.    Darleene Cleaver, PTA 03/07/2024, 2:20 PM

## 2024-03-10 ENCOUNTER — Other Ambulatory Visit: Payer: Self-pay

## 2024-03-10 ENCOUNTER — Ambulatory Visit: Payer: Medicare Other

## 2024-03-10 DIAGNOSIS — M25561 Pain in right knee: Secondary | ICD-10-CM | POA: Diagnosis not present

## 2024-03-10 DIAGNOSIS — M6281 Muscle weakness (generalized): Secondary | ICD-10-CM

## 2024-03-10 DIAGNOSIS — M25661 Stiffness of right knee, not elsewhere classified: Secondary | ICD-10-CM

## 2024-03-10 DIAGNOSIS — R600 Localized edema: Secondary | ICD-10-CM

## 2024-03-10 DIAGNOSIS — R262 Difficulty in walking, not elsewhere classified: Secondary | ICD-10-CM

## 2024-03-10 NOTE — Therapy (Signed)
 OUTPATIENT PHYSICAL THERAPY LOWER EXTREMITY TREATMENT   Patient Name: Danielle Fitzpatrick MRN: [id number] DOB:[birth year], 73 y.o., female Today's Date: 03/10/2024  END OF SESSION:  PT End of Session - 03/10/24 1524     Visit Number 9    Date for PT Re-Evaluation 03/31/24    Authorization Type Medicare + [employer]    Progress Note Due on Visit 10    PT Start Time 1315    PT Stop Time 1402    PT Time Calculation (min) 47 min    Activity Tolerance Patient tolerated treatment well    Behavior During Therapy WFL for tasks assessed/performed                    Past Medical History:  Diagnosis Date   Allergy    Anxiety    Arthritis    Chicken pox    Depression    Diverticulitis    Diverticulosis    Hyperlipidemia    Past Surgical History:  Procedure Laterality Date   AUGMENTATION MAMMAPLASTY     LAPAROTOMY N/A 10/06/2023   Procedure: EXPLORATORY LAPAROTOMY;  Surgeon: Diamantina Monks, MD;  Location: [facility] OR;  Service: General;  Laterality: N/A;   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   Enthesopathy 02/09/2012   Other lymphedema 07/07/2011     PCP: Pearline Cables, MD   REFERRING PROVIDER: Beverely Low, MD  REFERRING DIAG:  [id number] (ICD-10-CM) - Other specified arthritis, right knee  Z96.651 (ICD-10-CM) - Total knee replacement status, right    THERAPY DIAG:  Acute pain of right knee  Stiffness of right knee, not elsewhere  classified  Muscle weakness (generalized)  Localized edema  Difficulty in walking, not elsewhere classified  Rationale for Evaluation and Treatment: Rehabilitation  ONSET DATE: DOS 02/15/2024  SUBJECTIVE:   SUBJECTIVE STATEMENT: Pt reports that she sleep very well on Saturday night.   NEXT MD VISIT: 30 days from last appointment  PERTINENT HISTORY: R knee OA, recent abdominal surgery (10/06/23), degenerative disc disease of lumbar spine, chronic LBP PAIN:  Are you having pain? Yes: NPRS scale: 2/10  Pain location: R knee Pain description: ache, post surgical  Aggravating factors: bending Relieving factors: tylenol, ice  PRECAUTIONS: Knee  RED FLAGS: None   WEIGHT BEARING RESTRICTIONS: No  FALLS:  Has patient fallen in last 6 months? No  LIVING ENVIRONMENT: Lives with: lives alone  adult children is assisting Lives in: House/apartment Stairs: No Has following equipment at home: Single point cane, Environmental consultant - 2 wheeled, and shower chair  OCCUPATION: retired  PLOF: Independent and Leisure: pilates and water aerobics   PATIENT GOALS: return to activities    OBJECTIVE:  Note: Objective measures were completed at Evaluation unless otherwise noted.  DIAGNOSTIC FINDINGS: DG lumbar spine 03/18/23 IMPRESSION: Progressive, severe degenerative disc disease at L4-5 and L5-S1 with unchanged grade 2 anterolisthesis at L4-L5 and moderate-severe facet arthropathy at these levels.   Unchanged moderate degenerative disc disease at L1-L2 and L2-L3.  PATIENT SURVEYS:  LEFS 11/80  COGNITION: Overall cognitive status: Within functional limits for tasks assessed     SENSATION: [facility]  EDEMA:  Circumferential:  knee L 32, R 36 cm thigh L 36 R 46 cm   MUSCLE LENGTH: NA  POSTURE: No Significant postural limitations  PALPATION: Negative squeeze test  LOWER EXTREMITY ROM:  Active ROM Right eval Left eval Right 02/26/24 R 02/29/24 R 03/07/24  Knee flexion 90 130 95 100  A/109 P 105 A  Knee extension Lacking 11 0 Lacking 6 4 2  A   (Blank rows = not tested)  LOWER EXTREMITY MMT:  MMT Right eval Left eval  Hip flexion 3 5  Hip extension    Hip abduction    Hip adduction    Knee flexion 3+ 5  Knee extension 2+ 25 deg knee lag 5  Ankle dorsiflexion 4 5  Ankle plantarflexion     (Blank rows = not tested)   GAIT: Distance walked: 50' Assistive device utilized: Environmental consultant - 2 wheeled Level of assistance: Modified independence Comments: 0.6 m/s, reciprocal step, decreased heel strike on Right.    TODAY'S TREATMENT:                                                                                                                              DATE: 03/10/24: Therapeutic activities:  Recumbent cycle 6 min level 1 Assessed pts report of R lat/ post heel pain with resting Supine cross friction massage over R peroneals, inst in self massage with rolling pin or massage stick that she has at home.  Also kinesiotaping R foot, 2 pieces extending from medial calcaneus to ant lower leg , following lat ankle, to bunch up fat pad more laterally under heel. Seated long arc quad -22 degrees lag Performed long arc quads with assistance, manual pressure by PT for terminal extension and for slowed, eccentric lowering. 15 reps Lateral heel taps with L LE, R foot on 2" block, cues to engage distal quads Gait training, emphasis on elongated step L, tends to avoid full R knee ext in pre swing . Seated R resisted hamstring curls 15 reps manual resistance by PT   Vasopneumatic: Gameready at end of session for post session soreness/edema.  10 min, med compression, 34 deg (coldest).    03/07/24 Therapeutic Exercise: to improve strength and mobility.  Demo, verbal and tactile cues throughout for technique. Bike x 6 min full rev  Neuromuscular Re-education: Step ups 4' 2 x 10 RLE Lateral step ups 4' 2 x 10 RLE Standing hip abduction x 10 bil 2lb Standing hip extension x 10 bil  2lb Standing marching x 10 bil 2lb Standing HS curls x 10 bil 2lb  Manual Therapy: to decrease muscle spasm, pain and improve mobility.  IASTM to R ITB, VL in S/L with edge tool Patellar mobs, scar tissue mobilization demo  Vasopneumatic: Gameready at end of session for post session soreness/edema.  10 min, med compression, 34 deg (  coldest).   03/04/24 Therapeutic Exercise: to improve strength and mobility.  Demo, verbal and tactile cues throughout for technique. Bike x 6 min full rev Supine quad stretch with strap - pt reported too much pain Supine ITB stretch with strap x 30" Therapeutic Activity: to improve functional performance. Stairs: reviewed up with good/down with bad prinicpal Standing heel raises 2x10 Runner stretch and gatsroc stretch with strap x 1 min Step ups 4' x 10 RLE Lateral step ups 2' x 10 RLE  Manual Therapy: to decrease muscle spasm, pain and improve mobility IASTM to R quads, ITB with s/s hand grip tool  Vasopneumatic: Gameready at end of session for post session soreness/edema.  10 min, med compression, 34 deg (coldest).  03/02/24 Therapeutic Exercise: to improve strength and mobility.  Demo, verbal and tactile cues throughout for technique. Bike x 6 min full rev Seated gastroc stretch with strap 2x30" Runner stretch 2x30" Church pews x 10 Retro steps x 10 Standing hip abduction x 10 bil Standing hip extension x 10 bil Standing marching x 10 bil Seated R LAQ x 10 2lb Manual Therapy: to decrease muscle spasm, pain and improve mobility.  STM to R medial/lateral gastroc  Vasopneumatic: Gameready at end of session for post session soreness/edema.  10 min, med compression, 34 deg (coldest).  02/29/24 Therapeutic Exercise: to improve strength and mobility.  Demo, verbal and tactile cues throughout for technique. Bike partial revolutions x 6 min Seated R LAQ x 10 Seated marching x 10 bil Supine R thomas stretch with L SKTC x 1 min Long sitting heel slides  AAROM 2x10 Supine R knee extension stretch w/ towel roll under heel Hamstring stretch seated 2x30" Gait Training With SPC- 90 ft x2 cues to increase knee flexion Vasopneumatic: Gameready at end of session for post session soreness/edema.  10 min, med compression, 34 deg (coldest).  02/26/24 Therapeutic Exercise: to improve strength and mobility.  Demo, verbal and tactile cues throughout for technique. Bike partial revolutions x 7 min  SAQ x 15 SLR x 10  Heel slides x 10  Bridge x 4 - painful -discontinued Gait Training With SPC, checked height of cane, reviewed use of cane, tried both hands, R hand most comfortable for knee, gait 2 x 50' with cues for placement.  Manual Therapy: to decrease muscle spasm and pain and improve mobility STM/TPR to R glute, STM/TPR to R hamstrings, skilled palpation and monitoring during dry needling. Trigger Point Dry Needling  Initial Treatment: Pt instructed on Dry Needling rational, procedures, and possible side effects. Pt instructed to expect mild to moderate muscle soreness later in the day and/or into the next day.  Pt instructed in methods to reduce muscle soreness. Pt instructed to continue prescribed HEP. Patient was educated on signs and symptoms of infection and other risk factors and advised to seek medical attention should they occur.  Patient verbalized understanding of these instructions and education.   Patient Verbal Consent Given: Yes Education Handout Provided: Yes Muscles Treated: R glut max Electrical Stimulation Performed: No Treatment Response/Outcome: Twitch Response Elicited and Palpable Increase in Muscle Length  Vasopneumatic: Gameready at end of session for post session soreness/edema.  10 min, low compression, 34 deg (coldest).    02/24/24 Therapeutic Exercise: to improve strength, ROM, flexibility, and endurance  Nustep L5x39min UE/LE  Rec Bike Partial rev x 2 min Supine QS - cues to avoid engaging glutes towel under  knee Supine SLR x 10 Thomas stretch x 1 min R side Seated R  LAQ x 10  GAIT TRAINING: To normalize gait pattern and improve safety. Cues to avoid R hip circumduction, to increase heel strike, increase knee flexion ; 270 feet with RW supervision  Manual Therapy: to decrease muscle spasm, pain and improve mobility.  STM to R glute med/min, lumbar paraspinals in sidelying  Game Ready x 10 min R knee med compression 34 deg  02/22/24 Therapeutic Exercise: to improve strength, ROM, flexibility, and endurance  Nustep L5x74min UE/LE Seated heel slides 2x10 Seated LAQ x 10 RLE- cues to avoid hip flexion Supine R QS 10x5"; 2 sets towel under knee Supine heel slides 2x10 Standing ant/post x 10   GAIT TRAINING: To normalize gait pattern and improve safety. With rolling walker; supervision cues to increase heel strike and hip/knee flexion; unsteady with SPC today  Game Ready x 10 min R knee med compression 34 deg       PATIENT EDUCATION:  Education details: HEP Person educated: Patient Education method: Chief Technology Officer Education comprehension: verbalized understanding  HOME EXERCISE PROGRAM: Access Code: [email or web address] URL: [email or web address] Date: 03/02/2024 Prepared by: Verta Ellen  Exercises - Seated Knee Flexion Slide  - 3 x daily - 7 x weekly - 1 sets - 10 reps - Seated Heel Slide  - 1 x daily - 7 x weekly - 3 sets - 10 reps - Seated Long Arc Quad  - 3 x daily - 7 x weekly - 1 sets - 10 reps - Supine Quad Set on Towel Roll  - 3 x daily - 7 x weekly - 1 sets - 10 reps - Seated Quad Set  - 3 x daily - 7 x weekly - 1 sets - 10 reps - Modified Thomas Stretch  - 1 x daily - 7 x weekly - 2 sets - 2 reps - 1 min hold - Standing Hip Abduction with Counter Support  - 1 x daily - 7 x weekly - 2 sets - 10 reps - Standing Hip Extension with Counter Support  - 1 x daily - 7 x weekly - 2 sets - 10 reps - Standing March with Counter Support  - 1 x daily - 7 x weekly - 2  sets - 10 reps - Gastroc Stretch on Wall  - 1 x daily - 7 x weekly - 2 sets - 2 reps - 30 seconds hold  ASSESSMENT:  CLINICAL IMPRESSION: Pt arrives continuing to do well and progress with R knee ROM. She is having difficulty sleeping, tried to address her R lat heel pain and sleeping discomfort.  Has some slight gait changes, related to her residual R knee terminal extension stiffness and weakness.    Thomos Lemons continues to demonstrate potential for improvement and would benefit from continued skilled therapy to address impairments.      OBJECTIVE IMPAIRMENTS: Abnormal gait, decreased activity tolerance, decreased balance, decreased endurance, decreased mobility, difficulty walking, decreased ROM, decreased strength, hypomobility, increased edema, increased fascial restrictions, impaired perceived functional ability, increased muscle spasms, and pain.   ACTIVITY LIMITATIONS: carrying, lifting, bending, sitting, standing, squatting, sleeping, stairs, transfers, bed mobility, dressing, and locomotion level  PARTICIPATION LIMITATIONS: meal prep, cleaning, laundry, driving, shopping, community activity, and yard work  PERSONAL FACTORS: Age, Past/current experiences, Transportation, and 3+ comorbidities: recent abdominal surgery (10/06/23), degenerative disc disease of lumbar spine, chronic LBP  are also affecting patient's functional outcome.   REHAB POTENTIAL: Good  CLINICAL DECISION MAKING: Evolving/moderate complexity  EVALUATION COMPLEXITY: Moderate   GOALS: Goals reviewed with patient? Yes  SHORT TERM GOALS: Target date: 03/03/2024    Independent with initial HEP. Baseline:  Goal status: MET 02/26/24  2.  Patient will have full right knee extension for safety with gait.  Baseline: lacking 10 deg.  Goal status: IN PROGRESS 03/07/24- 2 deg of extension  LONG TERM GOALS: Target date: 03/31/2024   Independent with advanced/ongoing HEP to improve outcomes and carryover.   Baseline:  Goal status: IN PROGRESS  2.  Patient will demonstrate right knee flexion to 120 deg to ascend/descend stairs. Baseline: 90 Goal status: IN PROGRESS- 03/07/24 105 deg  3. Patient will be able to ambulate 600' safely without AD and normal gait pattern to access community.  Baseline: 2WRW Goal status: IN PROGRESS  4.  Patient will be able to ascend/descend stairs with 1 HR and reciprocal step pattern safely to access home and community.  Baseline: unable  Goal status: IN PROGRESS   5.  Patient will demonstrate > 19/24 on DGI to demonstrate decreased risk of falls.   Baseline: NT Goal status: IN PROGRESS  6.  Patient will demonstrate improved functional LE strength by completing 5x STS in <14 seconds.   Baseline: NT Goal status: IN PROGRESS  7.  Patient will demonstrate 40/80 on LEFS to demonstrate improved mobility.  Baseline: 11/80 Goal status: IN PROGRESS  PLAN:  PT FREQUENCY: 2-3x/week  PT DURATION: 6 weeks  PLANNED INTERVENTIONS: 97110-Therapeutic exercises, 97530- Therapeutic activity, 97112- Neuromuscular re-education, [phone]- Self Care, 60454- Manual therapy, [phone]- Gait training, [phone]- Orthotic Fit/training, [phone]- Electrical stimulation (unattended), [phone]- Electrical stimulation (manual), 97016- Vasopneumatic device, Patient/Family education, Balance training, Stair training, Taping, Dry Needling, Joint mobilization, Joint manipulation, Spinal manipulation, Spinal mobilization, Scar mobilization, Cryotherapy, and Moist heat  PLAN FOR NEXT SESSION: progress strength, balance, gait as tolerated.  Modalities PRN.    Jahfari Ambers L Mykel Mohl, PT, DPT, OCS 03/10/2024, 3:27 PM

## 2024-03-12 ENCOUNTER — Encounter: Payer: Self-pay | Admitting: Family Medicine

## 2024-03-12 DIAGNOSIS — K5792 Diverticulitis of intestine, part unspecified, without perforation or abscess without bleeding: Secondary | ICD-10-CM

## 2024-03-14 ENCOUNTER — Other Ambulatory Visit: Payer: Self-pay | Admitting: Family Medicine

## 2024-03-14 ENCOUNTER — Ambulatory Visit: Payer: Medicare Other

## 2024-03-14 DIAGNOSIS — K5792 Diverticulitis of intestine, part unspecified, without perforation or abscess without bleeding: Secondary | ICD-10-CM

## 2024-03-14 MED ORDER — AMOXICILLIN-POT CLAVULANATE 875-125 MG PO TABS: 1.0000 | ORAL_TABLET | Freq: Two times a day (BID) | ORAL | 0 refills | Status: DC

## 2024-03-14 MED ORDER — FLUCONAZOLE 150 MG PO TABS: 150.0000 mg | ORAL_TABLET | Freq: Once | ORAL | 0 refills | Status: AC

## 2024-03-14 NOTE — Addendum Note (Signed)
 Addended by: Gates Kasal C on: 03/14/2024 07:56 AM   Modules accepted: Orders

## 2024-03-16 NOTE — Patient Instructions (Signed)
 Good to see you again today

## 2024-03-16 NOTE — Progress Notes (Unsigned)
 [facility] [address] [place], [place] [address] [phone]  Date:  03/17/2024   Name:  Danielle Fitzpatrick   DOB:  02-[name]-1951   MRN:  [id number]  PCP:  Kaylee Partridge, MD    Chief Complaint: No chief complaint on file.   History of Present Illness:  Ellaina Schuler is a 73 y.o. very pleasant female patient who presents with the following:  Patient seen today for periodic follow-up.  Most recent visit with myself was in January for preoperative clearance, she planned to have a right knee replacement.  History of hyperlipidemia, colonic volvulus November 2024 which was reduced surgically  She actually contacted me just last week with concern of possible diverticulitis-she did not feel that she was dealing with recurrence of volvulus.  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   Enthesopathy 02/09/2012   Other lymphedema 07/07/2011    Past Medical History:  Diagnosis Date   Allergy    Anxiety    Arthritis    Chicken pox    Depression    Diverticulitis    Diverticulosis    Hyperlipidemia     Past Surgical History:  Procedure Laterality Date   AUGMENTATION MAMMAPLASTY     LAPAROTOMY N/A 10/06/2023   Procedure: EXPLORATORY  LAPAROTOMY;  Surgeon: Anda Bamberg, MD;  Location: [facility] OR;  Service: General;  Laterality: N/A;    Social History   Tobacco Use   Smoking status: Never   Smokeless tobacco: Never  Vaping Use   Vaping status: Never Used  Substance Use Topics   Alcohol use: Yes    Comment: occasionally   Drug use: No    Family History  Problem Relation Age of Onset   Depression Mother    Hyperlipidemia Mother    Hypertension Mother    Hypertension Father    Hyperlipidemia Father    Heart disease Father    Hearing loss Father    Diabetes Father    Depression Father    Rheum arthritis Father    Depression Sister    Alcohol abuse Brother    Depression Brother    Early death Brother    Heart attack Brother    Hypertension Brother    Hearing loss Maternal Grandmother    Diabetes Maternal Grandfather    Early death Maternal Grandfather    Heart disease Maternal Grandfather    Alcohol abuse Paternal Grandmother    Early death Paternal Grandmother    Hyperlipidemia Daughter    Anxiety disorder Son    Wilson's disease Neg Hx    Colon cancer Neg Hx    Stomach cancer Neg Hx    Esophageal cancer Neg Hx    Rectal cancer Neg Hx     Allergies  Allergen Reactions   Crestor [Rosuvastatin]  Shakes and tremors    Medication list has been reviewed and updated.  Current Outpatient Medications on File Prior to Visit  Medication Sig Dispense Refill   acetaminophen (TYLENOL) 500 MG tablet Take 500 mg by mouth 3 (three) times daily as needed for moderate pain (lower back pain).     amoxicillin-clavulanate (AUGMENTIN) 875-125 MG tablet Take 1 tablet by mouth 2 (two) times daily. 20 tablet 0   aspirin EC 81 MG tablet Take 81 mg by mouth.     B Complex Vitamins (VITAMIN-B COMPLEX PO) Take by mouth.     Biotin [address] MCG TABS Take by mouth.     buPROPion (WELLBUTRIN XL) 150 MG 24 hr tablet Take 3 tablets (450 mg total) by mouth daily. 270 tablet 1   buPROPion (WELLBUTRIN) 100 MG tablet Take 100  mg by mouth 2 (two) times daily. (Patient not taking: Reported on 02/18/2024)     cetirizine (ZYRTEC) 10 MG tablet Take 10 mg by mouth daily.     Cholecalciferol (VITAMIN D) 2000 units CAPS Take by mouth.     Cyanocobalamin 1000 MCG TBCR Take by mouth.     hydrocortisone (ANUSOL-HC) 25 MG suppository Place 1 suppository (25 mg total) rectally 2 (two) times daily. 12 suppository 0   Magnesium 300 MG CAPS Take by mouth.     methocarbamol (ROBAXIN) 500 MG tablet Take 500 mg by mouth 3 (three) times daily.     ondansetron (ZOFRAN) 4 MG tablet Take 4 mg by mouth every 8 (eight) hours as needed.     spironolactone (ALDACTONE) 100 MG tablet TAKE 1/2 TABLET BY MOUTH DAILY AS NEEDED FOR SWELLING 45 tablet 3   valACYclovir (VALTREX) 1000 MG tablet Take 2000 mg by mouth, repeat dose in 12 hours. (Patient taking differently: 1,000 mg every 12 (twelve) hours as needed. Take 2000 mg by mouth, repeat dose in 12 hours.) 20 tablet 0   No current facility-administered medications on file prior to visit.    Review of Systems:  As per HPI- otherwise negative.   Physical Examination: There were no vitals filed for this visit. There were no vitals filed for this visit. There is no height or weight on file to calculate BMI. Ideal Body Weight:    GEN: no acute distress. HEENT: Atraumatic, Normocephalic.  Ears and Nose: No external deformity. CV: RRR, No M/G/R. No JVD. No thrill. No extra heart sounds. PULM: CTA B, no wheezes, crackles, rhonchi. No retractions. No resp. distress. No accessory muscle use. ABD: S, NT, ND, +BS. No rebound. No HSM. EXTR: No c/c/e PSYCH: Normally interactive. Conversant.    Assessment and Plan: ***  Signed Gates Kasal, MD

## 2024-03-17 ENCOUNTER — Encounter: Payer: Self-pay | Admitting: Family Medicine

## 2024-03-17 ENCOUNTER — Other Ambulatory Visit: Payer: Self-pay

## 2024-03-17 ENCOUNTER — Ambulatory Visit: Payer: Medicare Other

## 2024-03-17 ENCOUNTER — Encounter (HOSPITAL_BASED_OUTPATIENT_CLINIC_OR_DEPARTMENT_OTHER): Payer: Self-pay

## 2024-03-17 ENCOUNTER — Emergency Department (HOSPITAL_BASED_OUTPATIENT_CLINIC_OR_DEPARTMENT_OTHER)

## 2024-03-17 ENCOUNTER — Ambulatory Visit (INDEPENDENT_AMBULATORY_CARE_PROVIDER_SITE_OTHER): Admitting: Family Medicine

## 2024-03-17 ENCOUNTER — Emergency Department (HOSPITAL_BASED_OUTPATIENT_CLINIC_OR_DEPARTMENT_OTHER)
Admission: EM | Admit: 2024-03-17 | Discharge: 2024-03-17 | Disposition: A | Discharge status: Home / Self Care | Attending: Emergency Medicine | Admitting: Emergency Medicine

## 2024-03-17 VITALS — BP 112/62 | HR 95 | Temp 98.2°F | Resp 18 | Ht 62.0 in | Wt 127.0 lb

## 2024-03-17 DIAGNOSIS — Z96659 Presence of unspecified artificial knee joint: Secondary | ICD-10-CM | POA: Diagnosis not present

## 2024-03-17 DIAGNOSIS — K5903 Drug induced constipation: Secondary | ICD-10-CM | POA: Diagnosis not present

## 2024-03-17 DIAGNOSIS — Z7982 Long term (current) use of aspirin: Secondary | ICD-10-CM | POA: Insufficient documentation

## 2024-03-17 DIAGNOSIS — R1084 Generalized abdominal pain: Secondary | ICD-10-CM | POA: Diagnosis not present

## 2024-03-17 DIAGNOSIS — M25511 Pain in right shoulder: Secondary | ICD-10-CM

## 2024-03-17 DIAGNOSIS — R1031 Right lower quadrant pain: Secondary | ICD-10-CM | POA: Diagnosis present

## 2024-03-17 DIAGNOSIS — R1030 Lower abdominal pain, unspecified: Secondary | ICD-10-CM

## 2024-03-17 LAB — POCT URINALYSIS DIP (MANUAL ENTRY)
Bilirubin, UA: NEGATIVE
Blood, UA: NEGATIVE
Glucose, UA: NEGATIVE mg/dL
Ketones, POC UA: NEGATIVE mg/dL
Leukocytes, UA: NEGATIVE
Nitrite, UA: NEGATIVE
Protein Ur, POC: NEGATIVE mg/dL
Spec Grav, UA: <=1.005 — AB (ref 1.010–1.025)
Urobilinogen, UA: 0.2 U/dL
pH, UA: 7 (ref 5.0–8.0)

## 2024-03-17 LAB — CBC
HCT: 37.5 % (ref 36.0–46.0)
Hemoglobin: 12.6 g/dL (ref 12.0–15.0)
MCH: 32.1 pg (ref 26.0–34.0)
MCHC: 33.6 g/dL (ref 30.0–36.0)
MCV: 95.7 fL (ref 80.0–100.0)
Platelets: 358 K/uL (ref 150–400)
RBC: 3.92 MIL/uL (ref 3.87–5.11)
RDW: 12.9 % (ref 11.5–15.5)
WBC: 11.5 K/uL — ABNORMAL HIGH (ref 4.0–10.5)
nRBC: 0 % (ref 0.0–0.2)

## 2024-03-17 LAB — COMPREHENSIVE METABOLIC PANEL WITH GFR
ALT: 24 U/L (ref 0–44)
AST: 24 U/L (ref 15–41)
Albumin: 4.1 g/dL (ref 3.5–5.0)
Alkaline Phosphatase: 96 U/L (ref 38–126)
Anion gap: 10 (ref 5–15)
BUN: 10 mg/dL (ref 8–23)
CO2: 26 mmol/L (ref 22–32)
Calcium: 9.3 mg/dL (ref 8.9–10.3)
Chloride: 99 mmol/L (ref 98–111)
Creatinine, Ser: 0.55 mg/dL (ref 0.44–1.00)
GFR, Estimated: >60 mL/min (ref >60)
Glucose, Bld: 99 mg/dL (ref 70–99)
Potassium: 4.5 mmol/L (ref 3.5–5.1)
Sodium: 135 mmol/L (ref 135–145)
Total Bilirubin: 0.5 mg/dL (ref 0.0–1.2)
Total Protein: 7.1 g/dL (ref 6.5–8.1)

## 2024-03-17 MED ORDER — LACTATED RINGERS IV BOLUS
1000.0000 mL | Freq: Once | INTRAVENOUS | Status: AC
  Administered 2024-03-17: 1000 mL via INTRAVENOUS

## 2024-03-17 MED ORDER — IOHEXOL 300 MG/ML  SOLN
80.0000 mL | Freq: Once | INTRAMUSCULAR | Status: AC | PRN
  Administered 2024-03-17: 80 mL via INTRAVENOUS

## 2024-03-17 NOTE — ED Triage Notes (Signed)
 Pt sent by Dr Allison Arena. Pt complains of constipation and difficulty passing gas 5 days. Concerned due hx cecal volvuolus in November Knee sx 02/15/24 Only taking tylenol for pain. Abdominal discomfort. Able to pass gas this morning

## 2024-03-17 NOTE — ED Provider Notes (Signed)
 [facility] Provider Note   CSN: [id number] Arrival date & time: 03/17/24  1351     History  Chief Complaint  Patient presents with   Constipation    Danielle Fitzpatrick is a 73 y.o. female.  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  Patient reports that she had a knee replacement done about a month ago and after the knee replacement she was on pain medicine for a few weeks which caused constipation which she then discontinued and had to continue taking Zofran for nausea for an additional week although while experiencing significant constipation where she would only have a very small stool regularly.  She started developing more abdominal distention and lower abdominal discomfort over the last week and talked with her doctor on Monday and was started on an antibiotic for concern for possible diverticulitis.  She has taken 3-1/2 doses of that but continues to have the fullness and minimal bowel movements.  She did have a very small stool this morning but reports she is overall not eating as much having early satiety, nausea has improved but is not passing much gas.  She also reports her urine has been darker than normal but she is drinking water.  No fevers, dysuria, frequency or urgency.  No back pain, chest pain or shortness of breath.  No vomiting.  The history is provided by the patient and medical records.  Constipation      Home Medications Prior to Admission medications   Medication Sig Start Date End Date Taking? Authorizing Provider  acetaminophen (TYLENOL) 500 MG tablet Take 500 mg by mouth 3 (three) times daily as needed for moderate pain (lower back pain).    [provider]  amoxicillin-clavulanate (AUGMENTIN) 875-125 MG tablet Take 1 tablet by mouth 2 (two) times daily. 03/14/24    Copland, Gwenlyn Found, MD  aspirin EC 81 MG tablet Take 81 mg by mouth. 10/20/12   [provider]  B Complex Vitamins (VITAMIN-B COMPLEX PO) Take by mouth.    [provider]  Biotin 62952 MCG TABS Take by mouth.    [provider]  buPROPion (WELLBUTRIN XL) 150 MG 24 hr tablet Take 3 tablets (450 mg total) by mouth daily. 02/15/24   Copland, Gwenlyn Found, MD  cetirizine (ZYRTEC) 10 MG tablet Take 10 mg by mouth daily.    [provider]  Cholecalciferol (VITAMIN D) 2000 units CAPS Take by mouth.    [provider]  Cyanocobalamin 1000 MCG TBCR Take by mouth. 02/12/17   [provider]  hydrocortisone (ANUSOL-HC) 25 MG suppository Place 1 suppository (25 mg total) rectally 2 (two) times daily. 10/21/23   Sherrilyn Rist, MD  Magnesium 300 MG CAPS Take by mouth.    [provider]  spironolactone (ALDACTONE) 100 MG tablet TAKE 1/2 TABLET BY MOUTH DAILY AS NEEDED FOR SWELLING 02/22/24   Copland, Gwenlyn Found, MD  valACYclovir (VALTREX) 1000 MG tablet Take 2000 mg by mouth, repeat dose in 12 hours. Patient taking differently: 1,000 mg every 12 (twelve) hours as needed. Take 2000 mg by mouth, repeat dose in 12 hours. 08/24/23   Copland, Gwenlyn Found, MD      Allergies    Crestor [rosuvastatin], Dilaudid [hydromorphone hcl], and Morphine    Review of Systems   Review of Systems  Gastrointestinal:  Positive for constipation.  Physical Exam Updated Vital Signs BP 134/76   Pulse 97   Temp 97.7 F (36.5 C) (Oral)   Resp 18   Wt 57.6 kg   SpO2 98%   BMI 23.23 kg/m  Physical Exam Vitals and nursing note reviewed.  Constitutional:      General: She is in acute distress.     Appearance: She is well-developed.  HENT:     Head: Normocephalic and atraumatic.  Eyes:     Pupils: Pupils are equal, round, and reactive to light.  Cardiovascular:     Rate and Rhythm: Normal rate and regular rhythm.     Heart sounds: Normal heart sounds. No  murmur heard.    No friction rub.  Pulmonary:     Effort: Pulmonary effort is normal.     Breath sounds: Normal breath sounds. No wheezing or rales.  Abdominal:     General: Bowel sounds are decreased. There is no distension.     Palpations: Abdomen is soft.     Tenderness: There is abdominal tenderness in the right lower quadrant, suprapubic area and left lower quadrant. There is no guarding or rebound.  Musculoskeletal:        General: No tenderness. Normal range of motion.     Comments: No edema  Skin:    General: Skin is warm and dry.     Findings: No rash.  Neurological:     Mental Status: She is alert and oriented to person, place, and time.     Cranial Nerves: No cranial nerve deficit.  Psychiatric:        Behavior: Behavior normal.     ED Results / Procedures / Treatments   Labs (all labs ordered are listed, but only abnormal results are displayed) Labs Reviewed  CBC - Abnormal; Notable for the following components:      Result Value   WBC 11.5 (*)    All other components within normal limits  COMPREHENSIVE METABOLIC PANEL WITH GFR  URINALYSIS, ROUTINE W REFLEX MICROSCOPIC    EKG None  Radiology CT ABDOMEN PELVIS W CONTRAST Result Date: 03/17/2024 CLINICAL DATA:  Acute nonlocalized abdominal pain EXAM: CT ABDOMEN AND PELVIS WITH CONTRAST TECHNIQUE: Multidetector CT imaging of the abdomen and pelvis was performed using the standard protocol following bolus administration of intravenous contrast. RADIATION DOSE REDUCTION: This exam was performed according to the departmental dose-optimization program which includes automated exposure control, adjustment of the mA and/or kV according to patient size and/or use of iterative reconstruction technique. CONTRAST:  80mL OMNIPAQUE IOHEXOL 300 MG/ML  SOLN COMPARISON:  10/06/2023 FINDINGS: Lower chest: No acute abnormality. Hepatobiliary: Unremarkable liver. Normal gallbladder. No biliary dilation. Pancreas: Unremarkable. Spleen:  Unremarkable. Adrenals/Urinary Tract: Normal adrenal glands. No urinary calculi or hydronephrosis. Bladder is unremarkable. Stomach/Bowel: Normal caliber large and small bowel. Sigmoid diverticulosis. Associated sigmoid wall thickening and adjacent stranding. No perforation or abscess. Mobile cecum in the right upper quadrant without evidence of volvulus. Normal appendix. Vascular/Lymphatic: Aortic atherosclerosis. No enlarged abdominal or pelvic lymph nodes. Reproductive: Calcified fibroid in the uterus.  Unremarkable adnexa. Other: No abscess or free intraperitoneal air. Musculoskeletal: No acute fracture. Chronic grade 1-2 anterolisthesis of L4. IMPRESSION: Acute uncomplicated sigmoid diverticulitis. No evidence of perforation or abscess. Consider follow-up colonoscopy following resolution of patient's acute symptoms to exclude underlying mass. Aortic Atherosclerosis (ICD10-I70.0). Electronically Signed   By: Rozell Cornet M.D.   On: 03/17/2024 18:25    Procedures Procedures    Medications Ordered in ED Medications  lactated ringers  bolus 1,000 mL (0 mLs Intravenous Stopped 03/17/24 1734)  iohexol (OMNIPAQUE) 300 MG/ML solution 80 mL (80 mLs Intravenous Contrast Given 03/17/24 1547)    ED Course/ Medical Decision Making/ A&P                                 Medical Decision Making Amount and/or Complexity of Data Reviewed Labs: ordered. Decision-making details documented in ED Course. Radiology: ordered and independent interpretation performed. Decision-making details documented in ED Course.  Risk Prescription drug management.   Pt with multiple medical problems and comorbidities and presenting today with a complaint that caries a high risk for morbidity and mortality.  Here today with sluggish bowels, lower abdominal pain in the setting of recent cecal volvulus requiring general surgery.  Patient's midline abdominal scar is well-healed but bowel sounds are diminished.  Concern for  ileus, partial small bowel obstruction, diverticulitis.  Low suspicion for surgical complication.  Also low suspicion for UTI or renal stones.  Also concern for constipation.  Patient given IV fluids.  I independently interpreted patient's labs and CBC with minimal leukocytosis of 11.5 but normal hemoglobin and platelets, CMP is within normal limits.  CT is pending.  7:36 PM Patient has received CT.  I have independently visualized and interpreted pt's images today.  CT of the abdomen pelvis does not show evidence of renal stones, hydronephrosis or frank obstruction.  Still waiting on radiology read and patient reports at this time she would like to go home.  She wants to be called with results.  Discussed with her that I cannot confirm that her CT is normal and she is understanding but still would like to go home. Per radiology pt has acute uncomplicated diverticulitis without signs of volvulus         Final Clinical Impression(s) / ED Diagnoses Final diagnoses:  Drug-induced constipation  Lower abdominal pain    Rx / DC Orders ED Discharge Orders     None         Almond Army, MD 03/17/24 1941

## 2024-03-17 NOTE — Discharge Instructions (Addendum)
 I will call you with results.

## 2024-03-19 LAB — URINE CULTURE
MICRO NUMBER:: 16343038
SPECIMEN QUALITY:: ADEQUATE

## 2024-03-20 NOTE — Addendum Note (Signed)
 Addended by: Gates Kasal C on: 03/20/2024 07:22 AM   Modules accepted: Orders

## 2024-03-21 ENCOUNTER — Other Ambulatory Visit

## 2024-03-21 DIAGNOSIS — R1084 Generalized abdominal pain: Secondary | ICD-10-CM

## 2024-03-21 NOTE — Addendum Note (Signed)
 Addended by: Gates Kasal C on: 03/21/2024 05:34 AM   Modules accepted: Orders

## 2024-03-22 LAB — URINE CULTURE
MICRO NUMBER:: 16353415
Result:: NO GROWTH
SPECIMEN QUALITY:: ADEQUATE

## 2024-03-23 ENCOUNTER — Other Ambulatory Visit: Payer: Self-pay | Admitting: Family Medicine

## 2024-03-23 ENCOUNTER — Encounter: Payer: Self-pay | Admitting: Family Medicine

## 2024-03-23 DIAGNOSIS — K5792 Diverticulitis of intestine, part unspecified, without perforation or abscess without bleeding: Secondary | ICD-10-CM

## 2024-03-23 MED ORDER — FLUCONAZOLE 150 MG PO TABS: 150.0000 mg | ORAL_TABLET | Freq: Once | ORAL | 0 refills | Status: AC

## 2024-03-28 ENCOUNTER — Encounter: Payer: Self-pay | Admitting: Family Medicine

## 2024-05-20 ENCOUNTER — Ambulatory Visit: Payer: Self-pay

## 2024-05-20 NOTE — Telephone Encounter (Signed)
 FYI Only or Action Required?: Action required by provider: refusing ED, requesting appt with Dr. Geralyn Knee only, needs call back.  Patient was last seen in primary care on 03/17/2024 by Danielle Fitzpatrick, Danielle Dumas, MD. Called Nurse Triage reporting Altered Mental Status, Tremors, Shaking, and Dizziness. Symptoms began a week ago. Interventions attempted: Rest, hydration, or home remedies. Symptoms are: gradually worsening.  Triage Disposition: Go to ED or PCP/Alternative with Approval  Patient/caregiver understands and will follow disposition?: No, refuses disposition    Alerted [facility] of ED refusal  Copied from CRM [id number]. Topic: Clinical - Red Word Triage >> May 20, 2024 10:53 AM Kita Perish H wrote: [facility] that prompted transfer to Nurse Triage: Tremors and shaking in hands and other parts of body, some confusion issues Reason for Disposition  Patient sounds very sick or weak to the triager  Answer Assessment - Initial Assessment Questions 1. SYMPTOM: What is the main symptom you are concerned about? (e.g., weakness, numbness)     Confusion issues, things I've done on my phone a million times I can't remember how to do it, this just seems a little weird to me, not disoriented to location or otherwise, just takes a second to jog memory, pt alert and oriented on phone Biggest concern is the shaking, hands shaking noticeable to others, constant since it started, shaking in legs too, small shakes 2. ONSET: When did this start? (minutes, hours, days; while sleeping)     Shaking for about a week, never had this before Confusion for past couple days, maybe I'm scared about my shaking 3. LAST NORMAL: When was the last time you (the patient) were normal (no symptoms)?     A week ago 4. PATTERN Does this come and go, or has it been constant since it started?  Is it present now?     Shaking constant whole body pretty much, intermittent confusion 5. CARDIAC SYMPTOMS: Have you had any of the following  symptoms: chest pain, difficulty breathing, palpitations?     No chest pain, SOB, palpitations 6. NEUROLOGIC SYMPTOMS: Have you had any of the following symptoms: headache, dizziness, vision loss, double vision, changes in speech, unsteady on your feet?     Headache every once in a while not more than usual, no dizziness or vision loss, no double vision or floaters, no changes to speech, sometimes unsteady on feet nothing extreme, get up and feel like need a minute or a second, no weakness, numbness, or tingling 7. OTHER SYMPTOMS: Do you have any other symptoms?     No that's it   Advised ED to ensure nothing more going on with the shaking, potential confusion, and needing to recover upon standing I think I can wait, I've been driving Pt refusing, wants appt with Dr. Geralyn Knee only, sending message for earlier appt with Dr. Geralyn Knee, advised go to ED if worsening shaking, confusion, dizziness, or new symptoms  Protocols used: Neurologic Deficit-A-AH

## 2024-05-21 ENCOUNTER — Encounter: Payer: Self-pay | Admitting: Family Medicine

## 2024-05-23 ENCOUNTER — Encounter: Payer: Self-pay | Admitting: Family Medicine

## 2024-05-23 ENCOUNTER — Ambulatory Visit (INDEPENDENT_AMBULATORY_CARE_PROVIDER_SITE_OTHER): Admitting: Family Medicine

## 2024-05-23 VITALS — BP 132/74 | HR 92 | Ht 62.0 in | Wt 128.2 lb

## 2024-05-23 DIAGNOSIS — R251 Tremor, unspecified: Secondary | ICD-10-CM

## 2024-05-23 DIAGNOSIS — D649 Anemia, unspecified: Secondary | ICD-10-CM | POA: Diagnosis not present

## 2024-05-23 DIAGNOSIS — E782 Mixed hyperlipidemia: Secondary | ICD-10-CM | POA: Diagnosis not present

## 2024-05-23 LAB — CBC
HCT: 39.8 % (ref 36.0–46.0)
Hemoglobin: 13.3 g/dL (ref 12.0–15.0)
MCHC: 33.5 g/dL (ref 30.0–36.0)
MCV: 94.7 fl (ref 78.0–100.0)
Platelets: 341 10*3/uL (ref 150.0–400.0)
RBC: 4.2 Mil/uL (ref 3.87–5.11)
RDW: 12.4 % (ref 11.5–15.5)
WBC: 5.9 10*3/uL (ref 4.0–10.5)

## 2024-05-23 LAB — FERRITIN: Ferritin: 132.8 ng/mL (ref 10.0–291.0)

## 2024-05-23 LAB — LIPID PANEL
Cholesterol: 280 mg/dL — ABNORMAL HIGH (ref 0–200)
HDL: 93.9 mg/dL (ref >39.00)
LDL Cholesterol: 175 mg/dL — ABNORMAL HIGH (ref 0–99)
NonHDL: 185.7
Total CHOL/HDL Ratio: 3
Triglycerides: 56 mg/dL (ref 0.0–149.0)
VLDL: 11.2 mg/dL (ref 0.0–40.0)

## 2024-05-23 LAB — MAGNESIUM: Magnesium: 2 mg/dL (ref 1.5–2.5)

## 2024-05-23 LAB — VITAMIN B12: Vitamin B-12: 391 pg/mL (ref 211–911)

## 2024-05-23 LAB — TSH: TSH: 0.84 u[IU]/mL (ref 0.35–5.50)

## 2024-05-23 NOTE — Progress Notes (Signed)
 [facility] [address], [address] [place], [place] [address] [phone]  Date:  05/23/2024   Name:  Danielle Fitzpatrick   DOB:  1951/03/22   MRN:  [id number]  PCP:  Watt Harlene BROCKS, MD    Chief Complaint: Tremors (Onset 2 weeks everywhere and all day )   History of Present Illness:  Danielle Fitzpatrick is a 73 y.o. very pleasant female patient who presents with the following:  Patient seen today with concern of tremor, she reached out to me via MyChart and we scheduled this appointment. Otherwise last seen by myself in April when she had concern of diverticulitis She also has history of previous volvulus, hyperlipidemia  Per her MyChart message: . I started experiencing mild tremors in my hands last week and occasionally I am slightly off balance when standing. This is not an emergency situation but I would like to see you as soon as possible.  About 2 weeks ago she noted a tremor in her hand when she picked up a glass- she feels like the tremor is over her whole body, not just her hands though it is most noticeable in her hands.  The right hand is perhaps a bit worse There has been a lot of stress recently- she thought that might be the cause Her daughter then noted her hand was shaking when she played a board game and she was also concerned No muscle pain or cramping She is not aware of any weakness or numbness, no difficulty speaking or swallowing No unusual headaches  She is sleeping well, eating normally, other ADL's all ok She does note that she can be a big forgetful but does not think this has really changed  She may feel slightly off balance when she stands up from seated- this also has gone on for the last 2-3 weeks She notes this is not lightheadedness but more of a feeling of being off balance.  It will resolve within a second or 2   She had some steroid injections for her spine - this was maybe 3-4 weeks ago   Possibly this could be related to her tremor but unlikely  She is not aware of any family history of Parkinson's disease, etc.   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   Enthesopathy 02/09/2012   Other lymphedema 07/07/2011    Past Medical History:  Diagnosis Date   Allergy    Anxiety    Arthritis    Chicken pox    Depression    Diverticulitis    Diverticulosis    Hyperlipidemia     Past Surgical History:  Procedure Laterality Date   AUGMENTATION MAMMAPLASTY     LAPAROTOMY N/A 10/06/2023   Procedure: EXPLORATORY LAPAROTOMY;  Surgeon: Paola Dreama SAILOR, MD;  Location: [facility] OR;  Service: General;  Laterality: N/A;    Social History   Tobacco Use   Smoking status: Never   Smokeless tobacco: Never  Vaping  Use   Vaping status: Never Used  Substance Use Topics   Alcohol use: Yes    Comment: occasionally   Drug use: No    Family History  Problem Relation Age of Onset   Depression Mother    Hyperlipidemia Mother    Hypertension Mother    Hypertension Father    Hyperlipidemia Father    Heart disease Father    Hearing loss Father    Diabetes Father    Depression Father    Rheum arthritis Father    Depression Sister    Alcohol abuse Brother    Depression Brother    Early death Brother    Heart attack Brother    Hypertension Brother    Hearing loss Maternal Grandmother    Diabetes Maternal Grandfather    Early death Maternal Grandfather     Heart disease Maternal Grandfather    Alcohol abuse Paternal Grandmother    Early death Paternal Grandmother    Hyperlipidemia Daughter    Anxiety disorder Son    Wilson's disease Neg Hx    Colon cancer Neg Hx    Stomach cancer Neg Hx    Esophageal cancer Neg Hx    Rectal cancer Neg Hx     Allergies  Allergen Reactions   Crestor  [Rosuvastatin ]     Shakes and tremors   Dilaudid  [Hydromorphone  Hcl]     vomiting   Morphine      hypotension    Medication list has been reviewed and updated.  Current Outpatient Medications on File Prior to Visit  Medication Sig Dispense Refill   acetaminophen  (TYLENOL ) 500 MG tablet Take 500 mg by mouth 3 (three) times daily as needed for moderate pain (lower back pain).     aspirin EC 81 MG tablet Take 81 mg by mouth.     B Complex Vitamins (VITAMIN-B COMPLEX PO) Take by mouth.     Biotin 89999 MCG TABS Take by mouth.     buPROPion  (WELLBUTRIN  XL) 150 MG 24 hr tablet Take 3 tablets (450 mg total) by mouth daily. 270 tablet 1   cetirizine (ZYRTEC) 10 MG tablet Take 10 mg by mouth daily.     Cholecalciferol (VITAMIN D ) 2000 units CAPS Take by mouth.     hydrocortisone  (ANUSOL -HC) 25 MG suppository Place 1 suppository (25 mg total) rectally 2 (two) times daily. 12 suppository 0   Magnesium 300 MG CAPS Take by mouth.     spironolactone  (ALDACTONE ) 100 MG tablet TAKE 1/2 TABLET BY MOUTH DAILY AS NEEDED FOR SWELLING 45 tablet 3   valACYclovir  (VALTREX ) 1000 MG tablet Take 2000 mg by mouth, repeat dose in 12 hours. (Patient taking differently: 1,000 mg every 12 (twelve) hours as needed. Take 2000 mg by mouth, repeat dose in 12 hours.) 20 tablet 0   Cyanocobalamin  1000 MCG TBCR Take by mouth. (Patient not taking: Reported on 05/23/2024)     No current facility-administered medications on file prior to visit.    Review of Systems:  As per HPI- otherwise negative.   Physical Examination: Vitals:   05/23/24 1001  BP: 132/74  Pulse: 92  SpO2: 97%    Vitals:   05/23/24 1001  Weight: 128 lb 3.2 oz (58.2 kg)  Height: 5' 2 (1.575 m)   Body mass index is 23.45 kg/m. Ideal Body Weight: Weight in (lb) to have BMI = 25: 136.4  GEN: no acute distress.  Normal weight, looks well HEENT: Atraumatic, Normocephalic. Bilateral TM wnl, oropharynx normal.  PEERL,EOMI.  Ears and Nose: No external deformity. CV: RRR, No M/G/R. No JVD. No thrill. No extra heart sounds. PULM: CTA B, no wheezes, crackles, rhonchi. No retractions. No resp. distress. No accessory muscle use. ABD: S, NT, ND, +BS. No rebound. No HSM. EXTR: No c/c/e Normal strength, sensation, deep tendon reflex of all extremities.  Normal Romberg but she has difficulty with tandem gait.  Normal facial movement and sensation. PSYCH: Normally interactive. Conversant.  Minimal resting tremor in both hands right more than left   Assessment and Plan: Mixed hyperlipidemia - Plan: Lipid panel  Tremor - Plan: TSH, Vitamin B12, Magnesium, Ferritin, Ceruloplasmin  Mild anemia - Plan: Ferritin, CBC  Patient seen today with concern of a tremor.  I suspect this is essential tremor, we will obtain some lab work as above to look for any secondary cause.  We discussed having her see neurology.  She would like to see what her labs show first She will let me know if anything is changing or getting worse  Will also follow-up on her lipids today Signed Harlene Schroeder, MD  Received labs as below, message to patient Results for orders placed or performed in visit on 05/23/24  TSH   Collection Time: 05/23/24 10:49 AM  Result Value Ref Range   TSH 0.84 0.35 - 5.50 uIU/mL  Vitamin B12   Collection Time: 05/23/24 10:49 AM  Result Value Ref Range   Vitamin B-12 391 211 - 911 pg/mL  Magnesium   Collection Time: 05/23/24 10:49 AM  Result Value Ref Range   Magnesium 2.0 1.5 - 2.5 mg/dL  Ferritin   Collection Time: 05/23/24 10:49 AM  Result Value Ref Range   Ferritin 132.8 10.0 - 291.0 ng/mL   Lipid panel   Collection Time: 05/23/24 10:49 AM  Result Value Ref Range   Cholesterol 280 (H) 0 - 200 mg/dL   Triglycerides 43.9 0.0 - 149.0 mg/dL   HDL 06.09 >60.99 mg/dL   VLDL 88.7 0.0 - 59.9 mg/dL   LDL Cholesterol 824 (H) 0 - 99 mg/dL   Total CHOL/HDL Ratio 3    NonHDL 185.70   CBC   Collection Time: 05/23/24 10:49 AM  Result Value Ref Range   WBC 5.9 4.0 - 10.5 K/uL   RBC 4.20 3.87 - 5.11 Mil/uL   Platelets 341.0 150.0 - 400.0 K/uL   Hemoglobin 13.3 12.0 - 15.0 g/dL   HCT 60.1 63.9 - 53.9 %   MCV 94.7 78.0 - 100.0 fl   MCHC 33.5 30.0 - 36.0 g/dL   RDW 87.5 88.4 - 84.4 %

## 2024-05-23 NOTE — Patient Instructions (Addendum)
 It was good to see you today, I will be in touch with your lab work asap and we can consider the next steps -low dose beta blocker to suppress tremor? -neurology consultation?   Recommend the shingles vaccine series/Shingrix and a tetanus booster at your pharmacy

## 2024-05-24 ENCOUNTER — Ambulatory Visit: Payer: Self-pay | Admitting: Family Medicine

## 2024-05-24 LAB — CERULOPLASMIN: Ceruloplasmin: 23 mg/dL (ref 14–48)

## 2024-05-24 MED ORDER — PROPRANOLOL HCL 10 MG PO TABS: 10.0000 mg | ORAL_TABLET | Freq: Three times a day (TID) | ORAL | 1 refills | Status: DC | PRN

## 2024-06-01 ENCOUNTER — Encounter: Payer: Self-pay | Admitting: Family Medicine

## 2024-06-01 ENCOUNTER — Other Ambulatory Visit (HOSPITAL_BASED_OUTPATIENT_CLINIC_OR_DEPARTMENT_OTHER): Payer: Self-pay

## 2024-06-01 ENCOUNTER — Ambulatory Visit (INDEPENDENT_AMBULATORY_CARE_PROVIDER_SITE_OTHER): Admitting: Family Medicine

## 2024-06-01 VITALS — BP 146/76 | HR 79 | Temp 97.6°F | Ht 62.0 in | Wt 130.0 lb

## 2024-06-01 DIAGNOSIS — K120 Recurrent oral aphthae: Secondary | ICD-10-CM

## 2024-06-01 DIAGNOSIS — J029 Acute pharyngitis, unspecified: Secondary | ICD-10-CM

## 2024-06-01 LAB — POCT RAPID STREP A (OFFICE): Rapid Strep A Screen: NEGATIVE

## 2024-06-01 MED ORDER — MAGIC MOUTHWASH W/LIDOCAINE: 5.0000 mL | Freq: Four times a day (QID) | ORAL | 0 refills | Status: DC | PRN

## 2024-06-01 MED ORDER — FLUCONAZOLE 150 MG PO TABS: 150.0000 mg | ORAL_TABLET | Freq: Every day | ORAL | 0 refills | Status: DC

## 2024-06-01 MED ORDER — AMOXICILLIN 500 MG PO CAPS: 500.0000 mg | ORAL_CAPSULE | Freq: Two times a day (BID) | ORAL | 0 refills | Status: AC

## 2024-06-01 MED ORDER — NYSTATIN 100000 UNIT/ML MT SUSP
5.0000 mL | Freq: Four times a day (QID) | OROMUCOSAL | 0 refills | Status: DC | PRN
  Filled 2024-06-01: qty 240, 12d supply, fill #0

## 2024-06-01 NOTE — Telephone Encounter (Signed)
 Called pt and set her up with [facility] today at 2pm

## 2024-06-01 NOTE — Progress Notes (Signed)
 Acute Office Visit  Subjective:     Patient ID: Danielle Fitzpatrick, female    DOB: 04/28/1951, 73 y.o.   MRN: [id number]  Chief Complaint  Patient presents with   Sore Throat     Patient is in today for sore throat.  Discussed the use of AI scribe software for clinical note transcription with the patient, who gave verbal consent to proceed.  History of Present Illness Danielle Fitzpatrick is a 73 year old female who presents with a sore throat and canker sore.  Symptoms began a couple of weeks ago with an itchy ear while at the beach. Initially thought to be allergies, she takes an over-the-counter [facility] brand allergy pill daily, which has helped prevent sinus infections for years. Resolved.  Four days ago, she developed a sore throat, primarily on the right side, worsening instead of improving. The pain feels as if there are canker sores extending down her throat. She also has a canker sore underneath her tongue, which is not painful unless they are in her gums. No fever, but she feels rundown and experiences tiny headaches, managed by drinking more water.  She has been taking 15 mg of meloxicam for back pain and Tylenol  in the evening, but these have not alleviated her throat pain. A home COVID test was negative. She has not been around anyone who is sick.  She mentions having a geographical tongue, which usually resolves with peroxide rinses. She noticed what appeared to be a yellowish color after brushing her teeth, which resolved after drinking water. Her ears were itchy initially but are no longer bothering her.  She also mentions a history of sinus issues, which she was unaware of until a few years ago, despite frequently blowing her nose.       ROS All review of systems negative except what is listed in the HPI      Objective:    BP (!) 146/76   Pulse 79   Temp 97.6 F (36.4 C) (Oral)   Ht 5' 2 (1.575 m)   Wt 130 lb (59 kg)   SpO2 100%   BMI 23.78  kg/m    Physical Exam Vitals reviewed.  Constitutional:      Appearance: Normal appearance.  HENT:     Right Ear: Tympanic membrane normal.     Left Ear: Tympanic membrane normal.     Nose:     Right Turbinates: Swollen.     Left Turbinates: Swollen.     Mouth/Throat:     Mouth: Mucous membranes are moist.     Tongue: Lesions present.     Pharynx: Oropharynx is clear. Postnasal drip present. No pharyngeal swelling or oropharyngeal exudate.     Tonsils: No tonsillar exudate or tonsillar abscesses. 1+ on the right. 1+ on the left.      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     Heart sounds: Normal heart sounds.  Pulmonary:     Effort: Pulmonary effort is normal.     Breath sounds: Normal breath sounds.  Lymphadenopathy:     Cervical:     Right cervical: No superficial, deep or posterior cervical adenopathy.    Left cervical: No superficial, deep or posterior cervical adenopathy.  Skin:    General: Skin is warm and dry.  Neurological:     Mental Status: She is alert and oriented to person, place, and  time.  Psychiatric:        Mood and Affect: Mood normal.        Behavior: Behavior normal.        Thought Content: Thought content normal.        Judgment: Judgment normal.       Results for orders placed or performed in visit on 06/01/24  POCT rapid strep A  Result Value Ref Range   Rapid Strep A Screen Negative Negative        Assessment & Plan:   Problem List Items Addressed This Visit   None Visit Diagnoses       Sore throat    -  Primary   Relevant Medications   magic mouthwash w/lidocaine  SOLN   amoxicillin  (AMOXIL ) 500 MG capsule   fluconazole  (DIFLUCAN ) 150 MG tablet   Other Relevant Orders   POCT rapid strep A (Completed)     Aphthous ulcer of tongue       Relevant Medications   magic mouthwash w/lidocaine  SOLN     Home COVID test  negative Strep test negative today in office Ulcer to tongue and geographical tongue but no obvious thrush Consider allergic vs viral etiology Start with supportive measures plus magic mouthwash  If not improving over the next 2 days, can start amoxicillin  (adding diflucan  as she is prone to yeast infections with ABX). Patient aware of signs/symptoms requiring further/urgent evaluation.      Meds ordered this encounter  Medications   magic mouthwash w/lidocaine  SOLN    Sig: Take 5 mLs by mouth 4 (four) times daily as needed for mouth pain. Swish and Spit    Dispense:  240 mL    Refill:  0    ([facility] w/lidocaine  1:1) Diphenhydramine (12.5ml/5ml) 105ml Nystatin (100,000 units/34ml), 15ml Hydrocortisone  60mg  tablet Lidocaine  2% solution,    Supervising Provider:   DOMENICA BLACKBIRD A [[id number]]   amoxicillin  (AMOXIL ) 500 MG capsule    Sig: Take 1 capsule (500 mg total) by mouth 2 (two) times daily for 7 days.    Dispense:  14 capsule    Refill:  0    Supervising Provider:   DOMENICA BLACKBIRD A [[id number]]   fluconazole  (DIFLUCAN ) 150 MG tablet    Sig: Take 1 tablet (150 mg total) by mouth daily. May repeat in 3 days if needed.    Dispense:  2 tablet    Refill:  0    Supervising Provider:   DOMENICA BLACKBIRD A [[id number]]    Return if symptoms worsen or fail to improve.  Waddell KATHEE Mon, NP

## 2024-06-02 ENCOUNTER — Other Ambulatory Visit (HOSPITAL_BASED_OUTPATIENT_CLINIC_OR_DEPARTMENT_OTHER): Payer: Self-pay

## 2024-06-16 ENCOUNTER — Encounter: Payer: Self-pay | Admitting: Family Medicine

## 2024-06-16 DIAGNOSIS — R251 Tremor, unspecified: Secondary | ICD-10-CM

## 2024-06-17 MED ORDER — GABAPENTIN 100 MG PO CAPS: 100.0000 mg | ORAL_CAPSULE | Freq: Three times a day (TID) | ORAL | 3 refills | Status: AC

## 2024-06-17 NOTE — Addendum Note (Signed)
 Addended by: WATT RAISIN C on: 06/17/2024 01:47 PM   Modules accepted: Orders

## 2024-06-22 ENCOUNTER — Other Ambulatory Visit: Payer: Self-pay | Admitting: Family Medicine

## 2024-06-22 DIAGNOSIS — R251 Tremor, unspecified: Secondary | ICD-10-CM

## 2024-07-11 ENCOUNTER — Encounter: Payer: Self-pay | Admitting: Family Medicine

## 2024-07-11 DIAGNOSIS — N952 Postmenopausal atrophic vaginitis: Secondary | ICD-10-CM

## 2024-07-11 DIAGNOSIS — R300 Dysuria: Secondary | ICD-10-CM

## 2024-07-11 DIAGNOSIS — N3000 Acute cystitis without hematuria: Secondary | ICD-10-CM

## 2024-07-11 MED ORDER — CEPHALEXIN 500 MG PO CAPS: 500.0000 mg | ORAL_CAPSULE | Freq: Two times a day (BID) | ORAL | 0 refills | Status: DC

## 2024-07-12 ENCOUNTER — Other Ambulatory Visit (INDEPENDENT_AMBULATORY_CARE_PROVIDER_SITE_OTHER)

## 2024-07-12 DIAGNOSIS — R300 Dysuria: Secondary | ICD-10-CM

## 2024-07-12 LAB — POCT URINALYSIS DIPSTICK
Bilirubin, UA: NEGATIVE
Blood, UA: NEGATIVE
Glucose, UA: NEGATIVE
Ketones, UA: NEGATIVE
Nitrite, UA: NEGATIVE
Protein, UA: NEGATIVE
Spec Grav, UA: 1.01 (ref 1.010–1.025)
Urobilinogen, UA: 0.2 U/dL
pH, UA: 6 (ref 5.0–8.0)

## 2024-07-12 NOTE — Addendum Note (Signed)
 Addended by: ORVIN HARLENE HERO on: 07/12/2024 10:30 AM   Modules accepted: Orders

## 2024-07-14 ENCOUNTER — Ambulatory Visit: Payer: Self-pay | Admitting: Family Medicine

## 2024-07-15 LAB — URINE CULTURE
MICRO NUMBER:: 16819653
SPECIMEN QUALITY:: ADEQUATE

## 2024-07-15 MED ORDER — CIPROFLOXACIN HCL 250 MG PO TABS: 250.0000 mg | ORAL_TABLET | Freq: Two times a day (BID) | ORAL | 0 refills | Status: AC

## 2024-07-15 MED ORDER — FLUCONAZOLE 150 MG PO TABS: 150.0000 mg | ORAL_TABLET | Freq: Once | ORAL | 0 refills | Status: AC

## 2024-07-15 NOTE — Addendum Note (Signed)
 Addended by: WATT RAISIN C on: 07/15/2024 01:05 PM   Modules accepted: Orders

## 2024-07-15 NOTE — Addendum Note (Signed)
 Addended by: WATT RAISIN C on: 07/15/2024 02:20 PM   Modules accepted: Orders

## 2024-07-15 NOTE — Telephone Encounter (Signed)
 Received urine culture-unfortunately it looks like we need to change her antibiotic.  Will put her on Cipro .  Message to patient, sent a prescription for Cipro  to her Arloa Prior  MICRO NUMBER: [id number]  SPECIMEN QUALITY: Adequate  Sample Source URINE  STATUS: FINAL  ISOLATE 1: Pseudomonas aeruginosa Abnormal   Comment: 10,000-49,000 CFU/mL of Pseudomonas aeruginosa  Resulting Agency [facility] [place]     Susceptibility   Pseudomonas aeruginosa    URINE CULTURE, REFLEX    AMIKACIN 4 Sensitive    CEFEPIME 0.5 Sensitive    CEFTAZIDIME 2 Sensitive    CIPROFLOXACIN  0.5 Sensitive    GENTAMICIN 2 Sensitive    IMIPENEM 4 Intermediate    LEVOFLOXACIN 1 Sensitive    MEROPENEM <=0.25 Sensitive    PIP/TAZO <=4 Sensitive 1

## 2024-07-16 NOTE — Addendum Note (Signed)
 Addended by: WATT RAISIN C on: 07/16/2024 06:11 AM   Modules accepted: Orders

## 2024-07-18 MED ORDER — ESTRADIOL 10 MCG VA TABS: ORAL_TABLET | VAGINAL | 2 refills | Status: DC

## 2024-07-18 NOTE — Addendum Note (Signed)
 Addended by: WATT RAISIN C on: 07/18/2024 10:32 AM   Modules accepted: Orders

## 2024-07-20 ENCOUNTER — Other Ambulatory Visit

## 2024-07-20 DIAGNOSIS — R300 Dysuria: Secondary | ICD-10-CM

## 2024-07-20 DIAGNOSIS — N3000 Acute cystitis without hematuria: Secondary | ICD-10-CM

## 2024-07-21 LAB — URINE CULTURE
MICRO NUMBER:: 16858454
SPECIMEN QUALITY:: ADEQUATE

## 2024-07-22 ENCOUNTER — Encounter: Payer: Self-pay | Admitting: Family Medicine

## 2024-08-10 ENCOUNTER — Other Ambulatory Visit: Payer: Self-pay | Admitting: Family Medicine

## 2024-08-17 ENCOUNTER — Encounter: Payer: Self-pay | Admitting: Family Medicine

## 2024-08-17 MED ORDER — HYDROCORTISONE ACETATE 25 MG RE SUPP: 25.0000 mg | Freq: Two times a day (BID) | RECTAL | 1 refills | Status: AC

## 2024-08-25 ENCOUNTER — Other Ambulatory Visit: Payer: Self-pay | Admitting: Neurosurgery

## 2024-09-06 ENCOUNTER — Encounter: Payer: Self-pay | Admitting: Family Medicine

## 2024-09-12 NOTE — Progress Notes (Signed)
 Surgical Instructions   Your procedure is scheduled on Thursday, October 16th. Report to [facility] Main Entrance A at 10:30 A.M., then check in with the Admitting office. Any questions or running late day of surgery: call [phone]  Questions prior to your surgery date: call [phone], Monday-Friday, 8am-4pm. If you experience any cold or flu symptoms such as cough, fever, chills, shortness of breath, etc. between now and your scheduled surgery, please notify us  at the above number.     Remember:  Do not eat or drink after midnight the night before your surgery    Take these medicines the morning of surgery with A SIP OF WATER  buPROPion  (WELLBUTRIN  XL)  cetirizine (ZYRTEC)    May take these medicines IF NEEDED: carboxymethylcellulose (REFRESH PLUS) eye drops    Follow your surgeon's instructions on when to stop Asprin.  If no instructions were given by your surgeon then you will need to call the office to get those instructions.     One week prior to surgery, STOP taking any Aleve, Naproxen, Ibuprofen, Motrin, Advil, Goody's, BC's, all herbal medications, fish oil, and non-prescription vitamins.                     Do NOT Smoke (Tobacco/Vaping) for 24 hours prior to your procedure.  If you use a CPAP at night, you may bring your mask/headgear for your overnight stay.   You will be asked to remove any contacts, glasses, piercing's, hearing aid's, dentures/partials prior to surgery. Please bring cases for these items if needed.    Patients discharged the day of surgery will not be allowed to drive home, and someone needs to stay with them for 24 hours.  SURGICAL WAITING ROOM VISITATION Patients may have no more than 2 support people in the waiting area - these visitors may rotate.   Pre-op nurse will coordinate an appropriate time for 1 ADULT support person, who may not rotate, to accompany patient in pre-op.  Children under the age of 50 must have an adult with them  who is not the patient and must remain in the main waiting area with an adult.  If the patient needs to stay at the hospital during part of their recovery, the visitor guidelines for inpatient rooms apply.  Please refer to the [facility] website for the visitor guidelines for any additional information.   If you received a COVID test during your pre-op visit  it is requested that you wear a mask when out in public, stay away from anyone that may not be feeling well and notify your surgeon if you develop symptoms. If you have been in contact with anyone that has tested positive in the last 10 days please notify you surgeon.      Pre-operative 4 CHG Bathing Instructions   You can play a key role in reducing the risk of infection after surgery. Your skin needs to be as free of germs as possible. You can reduce the number of germs on your skin by washing with CHG (chlorhexidine gluconate) soap before surgery. CHG is an antiseptic soap that kills germs and continues to kill germs even after washing.   DO NOT use if you have an allergy to chlorhexidine/CHG or antibacterial soaps. If your skin becomes reddened or irritated, stop using the CHG and notify one of our RNs at [phone].   Please shower with the CHG soap starting 4 days before surgery using the following schedule:     Please  keep in mind the following:  DO NOT shave, including legs and underarms, starting the day of your first shower.   You may shave your face at any point before/day of surgery.  Place clean sheets on your bed the day you start using CHG soap. Use a clean washcloth (not used since being washed) for each shower. DO NOT sleep with pets once you start using the CHG.   CHG Shower Instructions:  Wash your face and private area with normal soap. If you choose to wash your hair, wash first with your normal shampoo.  After you use shampoo/soap, rinse your hair and body thoroughly to remove shampoo/soap residue.  Turn  the water OFF and apply  bottle of CHG soap to a CLEAN washcloth.  Apply CHG soap ONLY FROM YOUR NECK DOWN TO YOUR TOES (washing for 3-5 minutes)  DO NOT use CHG soap on face, private areas, open wounds, or sores.  Pay special attention to the area where your surgery is being performed.  If you are having back surgery, having someone wash your back for you may be helpful. Wait 2 minutes after CHG soap is applied, then you may rinse off the CHG soap.  Pat dry with a clean towel  Put on clean clothes/pajamas   If you choose to wear lotion, please use ONLY the CHG-compatible lotions that are listed below.  Additional instructions for the day of surgery:  If you choose, you may shower the morning of surgery with an antibacterial soap.  DO NOT APPLY any lotions, deodorants, cologne, or perfumes.   Do not bring valuables to the hospital. [facility] is not responsible for any belongings/valuables. Do not wear nail polish, gel polish, artificial nails, or any other type of covering on natural nails (fingers and toes) Do not wear jewelry or makeup Put on clean/comfortable clothes.  Please brush your teeth.  Ask your nurse before applying any prescription medications to the skin.     CHG Compatible Lotions   Aveeno Moisturizing lotion  Cetaphil Moisturizing Cream  Cetaphil Moisturizing Lotion  Clairol Herbal Essence Moisturizing Lotion, Dry Skin  Clairol Herbal Essence Moisturizing Lotion, Extra Dry Skin  Clairol Herbal Essence Moisturizing Lotion, Normal Skin  Curel Age Defying Therapeutic Moisturizing Lotion with Alpha Hydroxy  Curel Extreme Care Body Lotion  Curel Soothing Hands Moisturizing Hand Lotion  Curel Therapeutic Moisturizing Cream, Fragrance-Free  Curel Therapeutic Moisturizing Lotion, Fragrance-Free  Curel Therapeutic Moisturizing Lotion, Original Formula  Eucerin Daily Replenishing Lotion  Eucerin Dry Skin Therapy Plus Alpha Hydroxy Crme  Eucerin Dry Skin Therapy Plus  Alpha Hydroxy Lotion  Eucerin Original Crme  Eucerin Original Lotion  Eucerin Plus Crme Eucerin Plus Lotion  Eucerin TriLipid Replenishing Lotion  Keri Anti-Bacterial Hand Lotion  Keri Deep Conditioning Original Lotion Dry Skin Formula Softly Scented  Keri Deep Conditioning Original Lotion, Fragrance Free Sensitive Skin Formula  Keri Lotion Fast Absorbing Fragrance Free Sensitive Skin Formula  Keri Lotion Fast Absorbing Softly Scented Dry Skin Formula  Keri Original Lotion  Keri Skin Renewal Lotion Keri Silky Smooth Lotion  Keri Silky Smooth Sensitive Skin Lotion  Nivea Body Creamy Conditioning Oil  Nivea Body Extra Enriched Lotion  Nivea Body Original Lotion  Nivea Body Sheer Moisturizing Lotion Nivea Crme  Nivea Skin Firming Lotion  NutraDerm 30 Skin Lotion  NutraDerm Skin Lotion  NutraDerm Therapeutic Skin Cream  NutraDerm Therapeutic Skin Lotion  ProShield Protective Hand Cream  Provon moisturizing lotion  Please read over the following fact sheets that you  were given.

## 2024-09-13 ENCOUNTER — Encounter (HOSPITAL_COMMUNITY)
Admission: RE | Admit: 2024-09-13 | Discharge: 2024-09-13 | Disposition: A | Discharge status: Home / Self Care | Source: Ambulatory Visit | Attending: Neurosurgery | Admitting: Neurosurgery

## 2024-09-13 ENCOUNTER — Encounter (HOSPITAL_COMMUNITY): Payer: Self-pay

## 2024-09-13 ENCOUNTER — Other Ambulatory Visit: Payer: Self-pay

## 2024-09-13 VITALS — BP 140/77 | HR 86 | Temp 98.0°F | Resp 16 | Ht 62.0 in | Wt 132.0 lb

## 2024-09-13 DIAGNOSIS — Z01812 Encounter for preprocedural laboratory examination: Secondary | ICD-10-CM | POA: Insufficient documentation

## 2024-09-13 DIAGNOSIS — D649 Anemia, unspecified: Secondary | ICD-10-CM | POA: Insufficient documentation

## 2024-09-13 DIAGNOSIS — Z01818 Encounter for other preprocedural examination: Secondary | ICD-10-CM

## 2024-09-13 LAB — CBC
HCT: 41.1 % (ref 36.0–46.0)
Hemoglobin: 13.8 g/dL (ref 12.0–15.0)
MCH: 32.1 pg (ref 26.0–34.0)
MCHC: 33.6 g/dL (ref 30.0–36.0)
MCV: 95.6 fL (ref 80.0–100.0)
Platelets: 367 K/uL (ref 150–400)
RBC: 4.3 MIL/uL (ref 3.87–5.11)
RDW: 12.3 % (ref 11.5–15.5)
WBC: 7.6 K/uL (ref 4.0–10.5)
nRBC: 0 % (ref 0.0–0.2)

## 2024-09-13 LAB — BASIC METABOLIC PANEL WITH GFR
Anion gap: 11 (ref 5–15)
BUN: 16 mg/dL (ref 8–23)
CO2: 23 mmol/L (ref 22–32)
Calcium: 9.3 mg/dL (ref 8.9–10.3)
Chloride: 100 mmol/L (ref 98–111)
Creatinine, Ser: 0.62 mg/dL (ref 0.44–1.00)
GFR, Estimated: >60 mL/min (ref >60)
Glucose, Bld: 103 mg/dL — ABNORMAL HIGH (ref 70–99)
Potassium: 3.7 mmol/L (ref 3.5–5.1)
Sodium: 134 mmol/L — ABNORMAL LOW (ref 135–145)

## 2024-09-13 LAB — TYPE AND SCREEN
ABO/RH(D): O NEG
Antibody Screen: NEGATIVE

## 2024-09-13 LAB — SURGICAL PCR SCREEN
MRSA, PCR: NEGATIVE
Staphylococcus aureus: NEGATIVE

## 2024-09-13 NOTE — Progress Notes (Signed)
 PCP - Harlene Perfect Cardiologist - Denies  PPM/ICD - Denies Device Orders - n/a Rep Notified - n/a  Chest x-ray - n./a EKG - 10-08-23 Stress Test - 06-29-18 ECHO - denies Cardiac Cath - denies  Sleep Study - denies CPAP - n/a  NON-diabetic  Last dose of GLP1 agonist-  denies GLP1 instructions: n/a  Blood Thinner Instructions: denies Aspirin Instructions: per patient last dose on Thursday, October 9th  ERAS Protcol - npo PRE-SURGERY Ensure or G2- n/a  COVID TEST- n/a   Anesthesia review: No  Patient denies shortness of breath, fever, cough and chest pain at PAT appointment. Patient denies any respiratory issues at this time.    All instructions explained to the patient, with a verbal understanding of the material. Patient agrees to go over the instructions while at home for a better understanding. Patient also instructed to self quarantine after being tested for COVID-19. The opportunity to ask questions was provided.

## 2024-09-15 ENCOUNTER — Encounter (HOSPITAL_COMMUNITY)
Admission: RE | Disposition: A | Discharge status: Home / Self Care | Payer: Self-pay | Source: Home / Self Care | Attending: Neurosurgery

## 2024-09-15 ENCOUNTER — Ambulatory Visit (HOSPITAL_COMMUNITY): Payer: Self-pay | Admitting: Physician Assistant

## 2024-09-15 ENCOUNTER — Ambulatory Visit (HOSPITAL_COMMUNITY)

## 2024-09-15 ENCOUNTER — Observation Stay (HOSPITAL_COMMUNITY)
Admission: RE | Admit: 2024-09-15 | Discharge: 2024-09-16 | Disposition: A | Discharge status: Home / Self Care | Attending: Neurosurgery | Admitting: Neurosurgery

## 2024-09-15 ENCOUNTER — Ambulatory Visit (HOSPITAL_COMMUNITY): Payer: Self-pay | Admitting: Certified Registered Nurse Anesthetist

## 2024-09-15 ENCOUNTER — Encounter (HOSPITAL_COMMUNITY): Payer: Self-pay | Admitting: Neurosurgery

## 2024-09-15 DIAGNOSIS — M48061 Spinal stenosis, lumbar region without neurogenic claudication: Secondary | ICD-10-CM | POA: Insufficient documentation

## 2024-09-15 DIAGNOSIS — M4316 Spondylolisthesis, lumbar region: Secondary | ICD-10-CM

## 2024-09-15 DIAGNOSIS — Z7982 Long term (current) use of aspirin: Secondary | ICD-10-CM | POA: Insufficient documentation

## 2024-09-15 DIAGNOSIS — Z85828 Personal history of other malignant neoplasm of skin: Secondary | ICD-10-CM | POA: Insufficient documentation

## 2024-09-15 LAB — ABO/RH: ABO/RH(D): O NEG

## 2024-09-15 SURGERY — POSTERIOR LUMBAR FUSION 1 LEVEL
Anesthesia: General | Site: Spine Lumbar

## 2024-09-15 MED ORDER — ONDANSETRON HCL 4 MG/2ML IJ SOLN
INTRAMUSCULAR | Status: DC | PRN
  Administered 2024-09-15: 4 mg via INTRAVENOUS

## 2024-09-15 MED ORDER — LACTATED RINGERS IV SOLN: INTRAVENOUS | Status: DC

## 2024-09-15 MED ORDER — CHLORHEXIDINE GLUCONATE 0.12 % MT SOLN: 15.0000 mL | Freq: Once | OROMUCOSAL | Status: DC

## 2024-09-15 MED ORDER — FENTANYL CITRATE (PF) 100 MCG/2ML IJ SOLN
INTRAMUSCULAR | Status: AC
  Filled 2024-09-15: qty 2

## 2024-09-15 MED ORDER — POLYETHYLENE GLYCOL 3350 17 G PO PACK: 17.0000 g | PACK | Freq: Every day | ORAL | Status: DC | PRN

## 2024-09-15 MED ORDER — THROMBIN 5000 UNITS EX SOLR
OROMUCOSAL | Status: DC | PRN
  Administered 2024-09-15: 5 mL via TOPICAL

## 2024-09-15 MED ORDER — ONDANSETRON HCL 4 MG/2ML IJ SOLN
INTRAMUSCULAR | Status: AC
  Filled 2024-09-15: qty 2

## 2024-09-15 MED ORDER — CHLORHEXIDINE GLUCONATE CLOTH 2 % EX PADS: 6.0000 | MEDICATED_PAD | Freq: Once | CUTANEOUS | Status: DC

## 2024-09-15 MED ORDER — LINACLOTIDE 72 MCG PO CAPS
72.0000 ug | ORAL_CAPSULE | Freq: Every day | ORAL | Status: DC
  Filled 2024-09-15: qty 1

## 2024-09-15 MED ORDER — METHOCARBAMOL 1000 MG/10ML IJ SOLN: 500.0000 mg | Freq: Four times a day (QID) | INTRAMUSCULAR | Status: DC | PRN

## 2024-09-15 MED ORDER — PHENYLEPHRINE HCL-NACL 20-0.9 MG/250ML-% IV SOLN
INTRAVENOUS | Status: DC | PRN
  Administered 2024-09-15: 20 ug/min via INTRAVENOUS

## 2024-09-15 MED ORDER — LIDOCAINE 2% (20 MG/ML) 5 ML SYRINGE
INTRAMUSCULAR | Status: DC | PRN
  Administered 2024-09-15: 60 mg via INTRAVENOUS

## 2024-09-15 MED ORDER — KETOROLAC TROMETHAMINE 15 MG/ML IJ SOLN
7.5000 mg | Freq: Four times a day (QID) | INTRAMUSCULAR | Status: DC
  Administered 2024-09-15 – 2024-09-16 (×2): 7.5 mg via INTRAVENOUS
  Filled 2024-09-15 (×2): qty 1

## 2024-09-15 MED ORDER — MORPHINE SULFATE (PF) 2 MG/ML IV SOLN
INTRAVENOUS | Status: AC
  Filled 2024-09-15: qty 1

## 2024-09-15 MED ORDER — ROCURONIUM BROMIDE 10 MG/ML (PF) SYRINGE
PREFILLED_SYRINGE | INTRAVENOUS | Status: AC
  Filled 2024-09-15: qty 10

## 2024-09-15 MED ORDER — SUGAMMADEX SODIUM 200 MG/2ML IV SOLN
INTRAVENOUS | Status: DC | PRN
  Administered 2024-09-15: 118 mg via INTRAVENOUS

## 2024-09-15 MED ORDER — SODIUM CHLORIDE 0.9% FLUSH: 3.0000 mL | INTRAVENOUS | Status: DC | PRN

## 2024-09-15 MED ORDER — ACETAMINOPHEN 650 MG RE SUPP: 650.0000 mg | RECTAL | Status: DC | PRN

## 2024-09-15 MED ORDER — LIDOCAINE-EPINEPHRINE 1 %-1:100000 IJ SOLN
INTRAMUSCULAR | Status: DC | PRN
  Administered 2024-09-15: 4.5 mL

## 2024-09-15 MED ORDER — SPIRONOLACTONE 25 MG PO TABS: 50.0000 mg | ORAL_TABLET | ORAL | Status: DC

## 2024-09-15 MED ORDER — MORPHINE SULFATE (PF) 2 MG/ML IV SOLN: 1.0000 mg | INTRAVENOUS | Status: DC | PRN

## 2024-09-15 MED ORDER — SODIUM CHLORIDE 0.9 % IV SOLN: 250.0000 mL | INTRAVENOUS | Status: DC

## 2024-09-15 MED ORDER — ALBUMIN HUMAN 5 % IV SOLN: INTRAVENOUS | Status: DC | PRN

## 2024-09-15 MED ORDER — ONDANSETRON HCL 4 MG/2ML IJ SOLN: 4.0000 mg | Freq: Four times a day (QID) | INTRAMUSCULAR | Status: DC | PRN

## 2024-09-15 MED ORDER — PHENOL 1.4 % MT LIQD: 1.0000 | OROMUCOSAL | Status: DC | PRN

## 2024-09-15 MED ORDER — SODIUM CHLORIDE 0.9% FLUSH
3.0000 mL | Freq: Two times a day (BID) | INTRAVENOUS | Status: DC
  Administered 2024-09-15: 3 mL via INTRAVENOUS

## 2024-09-15 MED ORDER — ONDANSETRON HCL 4 MG PO TABS: 4.0000 mg | ORAL_TABLET | Freq: Four times a day (QID) | ORAL | Status: DC | PRN

## 2024-09-15 MED ORDER — ORAL CARE MOUTH RINSE: 15.0000 mL | Freq: Once | OROMUCOSAL | Status: DC

## 2024-09-15 MED ORDER — OXYCODONE HCL 5 MG PO TABS
5.0000 mg | ORAL_TABLET | ORAL | Status: DC | PRN
  Administered 2024-09-16: 5 mg via ORAL
  Filled 2024-09-15: qty 1

## 2024-09-15 MED ORDER — ACETAMINOPHEN 325 MG PO TABS
650.0000 mg | ORAL_TABLET | ORAL | Status: DC | PRN
  Administered 2024-09-15 – 2024-09-16 (×2): 650 mg via ORAL
  Filled 2024-09-15 (×2): qty 2

## 2024-09-15 MED ORDER — BUPIVACAINE HCL (PF) 0.5 % IJ SOLN
INTRAMUSCULAR | Status: AC
  Filled 2024-09-15: qty 30

## 2024-09-15 MED ORDER — PROPOFOL 10 MG/ML IV BOLUS
INTRAVENOUS | Status: AC
Start: 2024-09-15 — End: 2024-09-15
  Filled 2024-09-15: qty 20

## 2024-09-15 MED ORDER — CHLORHEXIDINE GLUCONATE 0.12 % MT SOLN
OROMUCOSAL | Status: AC
  Filled 2024-09-15: qty 15

## 2024-09-15 MED ORDER — LIDOCAINE 2% (20 MG/ML) 5 ML SYRINGE
INTRAMUSCULAR | Status: AC
  Filled 2024-09-15: qty 5

## 2024-09-15 MED ORDER — PROPOFOL 10 MG/ML IV BOLUS
INTRAVENOUS | Status: DC | PRN
  Administered 2024-09-15: 120 mg via INTRAVENOUS

## 2024-09-15 MED ORDER — THROMBIN 5000 UNITS EX KIT
PACK | CUTANEOUS | Status: AC
  Filled 2024-09-15: qty 1

## 2024-09-15 MED ORDER — DEXAMETHASONE SOD PHOSPHATE PF 10 MG/ML IJ SOLN
INTRAMUSCULAR | Status: DC | PRN
  Administered 2024-09-15: 10 mg via INTRAVENOUS

## 2024-09-15 MED ORDER — OXYCODONE HCL 5 MG PO TABS: 10.0000 mg | ORAL_TABLET | ORAL | Status: DC | PRN

## 2024-09-15 MED ORDER — CEFAZOLIN SODIUM-DEXTROSE 2-4 GM/100ML-% IV SOLN
2.0000 g | Freq: Four times a day (QID) | INTRAVENOUS | Status: AC
  Administered 2024-09-15 – 2024-09-16 (×2): 2 g via INTRAVENOUS
  Filled 2024-09-15 (×2): qty 100

## 2024-09-15 MED ORDER — ACETAMINOPHEN 500 MG PO TABS
1000.0000 mg | ORAL_TABLET | Freq: Once | ORAL | Status: AC
  Administered 2024-09-15: 1000 mg via ORAL
  Filled 2024-09-15: qty 2

## 2024-09-15 MED ORDER — MENTHOL 3 MG MT LOZG: 1.0000 | LOZENGE | OROMUCOSAL | Status: DC | PRN

## 2024-09-15 MED ORDER — ROCURONIUM BROMIDE 10 MG/ML (PF) SYRINGE
PREFILLED_SYRINGE | INTRAVENOUS | Status: DC | PRN
  Administered 2024-09-15: 20 mg via INTRAVENOUS
  Administered 2024-09-15: 50 mg via INTRAVENOUS
  Administered 2024-09-15: 5 mg via INTRAVENOUS
  Administered 2024-09-15: 10 mg via INTRAVENOUS

## 2024-09-15 MED ORDER — PHENYLEPHRINE 80 MCG/ML (10ML) SYRINGE FOR IV PUSH (FOR BLOOD PRESSURE SUPPORT)
PREFILLED_SYRINGE | INTRAVENOUS | Status: DC | PRN
  Administered 2024-09-15: 80 ug via INTRAVENOUS
  Administered 2024-09-15: 160 ug via INTRAVENOUS
  Administered 2024-09-15 (×5): 80 ug via INTRAVENOUS

## 2024-09-15 MED ORDER — DOCUSATE SODIUM 100 MG PO CAPS
100.0000 mg | ORAL_CAPSULE | Freq: Two times a day (BID) | ORAL | Status: DC
  Filled 2024-09-15: qty 1

## 2024-09-15 MED ORDER — CEFAZOLIN SODIUM-DEXTROSE 2-4 GM/100ML-% IV SOLN
2.0000 g | INTRAVENOUS | Status: AC
Start: 2024-09-15 — End: 2024-09-15
  Administered 2024-09-15: 2 g via INTRAVENOUS
  Filled 2024-09-15: qty 100

## 2024-09-15 MED ORDER — METHOCARBAMOL 500 MG PO TABS
500.0000 mg | ORAL_TABLET | Freq: Four times a day (QID) | ORAL | Status: DC | PRN
  Filled 2024-09-15: qty 1

## 2024-09-15 MED ORDER — MORPHINE SULFATE (PF) 2 MG/ML IV SOLN
1.0000 mg | INTRAVENOUS | Status: DC | PRN
  Administered 2024-09-15 (×4): 1 mg via INTRAVENOUS

## 2024-09-15 MED ORDER — BUPROPION HCL ER (XL) 150 MG PO TB24: 450.0000 mg | ORAL_TABLET | Freq: Every day | ORAL | Status: DC

## 2024-09-15 MED ORDER — LIDOCAINE-EPINEPHRINE 1 %-1:100000 IJ SOLN
INTRAMUSCULAR | Status: AC
Start: 2024-09-15 — End: 2024-09-15
  Filled 2024-09-15: qty 1

## 2024-09-15 MED ORDER — FENTANYL CITRATE (PF) 100 MCG/2ML IJ SOLN
25.0000 ug | INTRAMUSCULAR | Status: DC | PRN
  Administered 2024-09-15 (×4): 25 ug via INTRAVENOUS
  Administered 2024-09-15: 50 ug via INTRAVENOUS

## 2024-09-15 MED ORDER — 0.9 % SODIUM CHLORIDE (POUR BTL) OPTIME
TOPICAL | Status: DC | PRN
  Administered 2024-09-15: 1000 mL

## 2024-09-15 MED ORDER — FLEET ENEMA RE ENEM: 1.0000 | ENEMA | Freq: Once | RECTAL | Status: DC | PRN

## 2024-09-15 MED ORDER — ACETAMINOPHEN 10 MG/ML IV SOLN
1000.0000 mg | Freq: Once | INTRAVENOUS | Status: AC
  Administered 2024-09-15: 1000 mg via INTRAVENOUS

## 2024-09-15 MED ORDER — ACETAMINOPHEN 10 MG/ML IV SOLN
INTRAVENOUS | Status: AC
  Filled 2024-09-15: qty 100

## 2024-09-15 MED ORDER — BUPIVACAINE HCL (PF) 0.5 % IJ SOLN
INTRAMUSCULAR | Status: DC | PRN
  Administered 2024-09-15: 4.5 mL

## 2024-09-15 MED ORDER — FENTANYL CITRATE (PF) 100 MCG/2ML IJ SOLN
INTRAMUSCULAR | Status: DC | PRN
  Administered 2024-09-15 (×2): 25 ug via INTRAVENOUS
  Administered 2024-09-15: 75 ug via INTRAVENOUS
  Administered 2024-09-15: 50 ug via INTRAVENOUS
  Administered 2024-09-15: 25 ug via INTRAVENOUS

## 2024-09-15 SURGICAL SUPPLY — 58 items
BAG COUNTER SPONGE SURGICOUNT (BAG) ×1 IMPLANT
BASKET BONE COLLECTION (BASKET) ×1 IMPLANT
BENZOIN TINCTURE PRP APPL 2/3 (GAUZE/BANDAGES/DRESSINGS) IMPLANT
BLADE BONE MILL MEDIUM (MISCELLANEOUS) ×1 IMPLANT
BLADE CLIPPER SURG (BLADE) IMPLANT
BLADE SURG 11 STRL SS (BLADE) ×1 IMPLANT
BUR MATCHSTICK NEURO 3.0 LAGG (BURR) ×1 IMPLANT
BUR PRECISION FLUTE 5.0 (BURR) ×1 IMPLANT
CANISTER SUCTION 3000ML PPV (SUCTIONS) ×1 IMPLANT
CNTNR URN SCR LID CUP LEK RST (MISCELLANEOUS) ×1 IMPLANT
COVER BACK TABLE 60X90IN (DRAPES) ×1 IMPLANT
DERMABOND ADVANCED .7 DNX12 (GAUZE/BANDAGES/DRESSINGS) ×1 IMPLANT
DRAPE C-ARM 42X72 X-RAY (DRAPES) ×1 IMPLANT
DRAPE C-ARMOR (DRAPES) ×1 IMPLANT
DRAPE LAPAROTOMY 100X72X124 (DRAPES) ×1 IMPLANT
DRAPE SURG 17X23 STRL (DRAPES) ×1 IMPLANT
DRSG OPSITE POSTOP 4X6 (GAUZE/BANDAGES/DRESSINGS) IMPLANT
DURAPREP 26ML APPLICATOR (WOUND CARE) ×1 IMPLANT
ELECTRODE REM PT RTRN 9FT ADLT (ELECTROSURGICAL) ×1 IMPLANT
GAUZE 4X4 16PLY ~~LOC~~+RFID DBL (SPONGE) IMPLANT
GAUZE SPONGE 4X4 12PLY STRL (GAUZE/BANDAGES/DRESSINGS) IMPLANT
GLOVE BIOGEL PI IND STRL 7.5 (GLOVE) ×2 IMPLANT
GLOVE ECLIPSE 7.0 STRL STRAW (GLOVE) ×2 IMPLANT
GOWN STRL REUS W/ TWL LRG LVL3 (GOWN DISPOSABLE) ×4 IMPLANT
GOWN STRL REUS W/ TWL XL LVL3 (GOWN DISPOSABLE) IMPLANT
GOWN STRL REUS W/TWL 2XL LVL3 (GOWN DISPOSABLE) IMPLANT
GRAFT BONE PROTEIOS XS 0.5CC (Orthopedic Implant) IMPLANT
HEMOSTAT POWDER KIT SURGIFOAM (HEMOSTASIS) ×1 IMPLANT
KIT BASIN OR (CUSTOM PROCEDURE TRAY) ×1 IMPLANT
KIT POSITIONER JACKSON TABLE (MISCELLANEOUS) ×1 IMPLANT
KIT TURNOVER KIT B (KITS) ×1 IMPLANT
MILL BONE PREP (MISCELLANEOUS) IMPLANT
NDL HYPO 18GX1.5 BLUNT FILL (NEEDLE) IMPLANT
NDL HYPO 22X1.5 SAFETY MO (MISCELLANEOUS) ×1 IMPLANT
NDL SPNL 18GX3.5 QUINCKE PK (NEEDLE) IMPLANT
NEEDLE HYPO 18GX1.5 BLUNT FILL (NEEDLE) IMPLANT
NEEDLE HYPO 22X1.5 SAFETY MO (MISCELLANEOUS) ×1 IMPLANT
NEEDLE SPNL 18GX3.5 QUINCKE PK (NEEDLE) IMPLANT
PACK LAMINECTOMY NEURO (CUSTOM PROCEDURE TRAY) ×1 IMPLANT
PAD ARMBOARD POSITIONER FOAM (MISCELLANEOUS) ×3 IMPLANT
PUTTY GRAFTON DBF 6CC W/DELIVE (Putty) IMPLANT
ROD COBALT 47.5X35 (Rod) IMPLANT
SCREW SET SOLERA TI (Screw) IMPLANT
SCREW SOLERA 40X6.5XMA NS SPNE (Screw) IMPLANT
SCREW SOLERA 6.5X35 (Screw) IMPLANT
SOLN 0.9% NACL POUR BTL 1000ML (IV SOLUTION) ×1 IMPLANT
SOLN STERILE WATER BTL 1000 ML (IV SOLUTION) ×1 IMPLANT
SPACER SPIN ARTIC L 12X25X8 5D (Spacer) IMPLANT
SPIKE FLUID TRANSFER (MISCELLANEOUS) ×1 IMPLANT
SPONGE SURGIFOAM ABS GEL 100 (HEMOSTASIS) IMPLANT
SPONGE T-LAP 4X18 ~~LOC~~+RFID (SPONGE) IMPLANT
STRIP CLOSURE SKIN 1/2X4 (GAUZE/BANDAGES/DRESSINGS) IMPLANT
SUT VIC AB 0 CT1 18XCR BRD8 (SUTURE) ×1 IMPLANT
SUT VICRYL 3-0 RB1 18 ABS (SUTURE) ×1 IMPLANT
TOWEL GREEN STERILE (TOWEL DISPOSABLE) ×1 IMPLANT
TOWEL GREEN STERILE FF (TOWEL DISPOSABLE) ×1 IMPLANT
TRAY FOLEY MTR SLVR 14FR STAT (SET/KITS/TRAYS/PACK) IMPLANT
TRAY FOLEY MTR SLVR 16FR STAT (SET/KITS/TRAYS/PACK) ×1 IMPLANT

## 2024-09-15 NOTE — Anesthesia Postprocedure Evaluation (Signed)
 Anesthesia Post Note  Patient: Danielle Fitzpatrick  Procedure(s) Performed: POSTERIOR LUMBAR FUSION LUMBAR FOUR-FIVE (Spine Lumbar)     Patient location during evaluation: PACU Anesthesia Type: General Level of consciousness: awake and alert Pain management: pain level controlled Vital Signs Assessment: post-procedure vital signs reviewed and stable Respiratory status: spontaneous breathing, nonlabored ventilation and respiratory function stable Cardiovascular status: blood pressure returned to baseline and stable Postop Assessment: no apparent nausea or vomiting Anesthetic complications: no   No notable events documented.  Last Vitals:  Vitals:   09/15/24 1900 09/15/24 1959  BP: 130/68 128/66  Pulse: (!) 103 (!) 101  Resp: 18 18  Temp:  (!) 36.4 C  SpO2: 100% 98%    Last Pain:  Vitals:   09/15/24 1959  TempSrc: Oral  PainSc:                  Kymani Shimabukuro,W. EDMOND

## 2024-09-15 NOTE — Transfer of Care (Signed)
 Immediate Anesthesia Transfer of Care Note  Patient: Danielle Fitzpatrick  Procedure(s) Performed: POSTERIOR LUMBAR FUSION LUMBAR FOUR-FIVE (Spine Lumbar)  Patient Location: PACU  Anesthesia Type:General  Level of Consciousness: awake, alert , and oriented  Airway & Oxygen Therapy: Patient Spontanous Breathing and Patient connected to face mask oxygen  Post-op Assessment: Report given to RN, Post -op Vital signs reviewed and stable, Patient moving all extremities X 4, and Patient able to stick tongue midline  Post vital signs: Reviewed and stable  Last Vitals:  Vitals Value Taken Time  BP 133/72 09/15/24 16:20  Temp 98.6   Pulse 112 09/15/24 16:23  Resp 18 09/15/24 16:22  SpO2 96 % 09/15/24 16:23  Vitals shown include unfiled device data.  Last Pain:  Vitals:   09/15/24 1007  TempSrc: Oral  PainSc: 7       Patients Stated Pain Goal: 3 (09/15/24 1007)  Complications: No notable events documented.

## 2024-09-15 NOTE — H&P (Signed)
 HPI:     Danielle Fitzpatrick is a 73 year old woman seen for initial consultation at the request of Dr. Juliene Bailey, MD.  She is referred for evaluation of now chronic back and left greater than right leg pain.  While she does report some chronic back pain for many years, her current complaints started several months ago and have been progressively worsening.  She describes relatively severe pain across her lower back, with radiation into the buttocks, down the back of the thigh, and into the anterolateral aspect of the calf/shin.  The left leg appears to be much worse than the right.  Pain is relatively constant, and significantly limits her ability to perform any activities.  She was, prior to onset of pain, relatively active with hiking, as well as yoga.  Since this started, the patient has also undergone a knee replacement.  She has been through some physical therapy, although to be fairly mostly for her knee.  She has also been on relatively high doses of OTC anti-inflammatories including Tylenol  and Motrin which provide perhaps mild improvement in symptoms.  She has undergone a series of injections both by Dr. Bonner and more recently by Dr. Darlis.  Her last injection was at about a month ago at which time she underwent bilateral L4-5 transforaminal steroid injection.  She reports more than 50% improvement in her symptoms, unfortunately this only lasted for a few weeks.  She is reporting complete recurrence of symptoms at this point.  Of note, she does not report any changes in bladder function.    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   Past Week   B Complex Vitamins (VITAMIN-B COMPLEX PO) Take 1 capsule by mouth in the morning.   09/13/2024   Biotin 89999 MCG TABS Take 1 tablet by mouth in the morning.   09/13/2024   buPROPion  (WELLBUTRIN  XL) 150 MG 24 hr tablet TAKE 3 TABLETS BY MOUTH DAILY 270 tablet 1 09/15/2024 at  6:00 AM   carboxymethylcellulose (REFRESH PLUS) 0.5 % SOLN Place 1 drop into both eyes 3 (three) times daily as needed (dry/irritated eyes.).   09/11/2024   cetirizine (ZYRTEC) 10 MG tablet Take 10 mg by mouth in the morning.   09/15/2024 at  6:00 AM   Cholecalciferol (VITAMIN D3) 50 MCG (2000  UT) TABS Take 2,000 Units by mouth in the morning.   09/13/2024   linaclotide (LINZESS) 72 MCG capsule Take 72 mcg by mouth daily before breakfast.   09/14/2024   Magnesium 200 MG TABS Take 200 mg by mouth in the morning.   09/11/2024   Misc Natural Products (OSTEO BI-FLEX TRIPLE STRENGTH PO) Take 1 tablet by mouth in the morning and at bedtime.   09/11/2024   spironolactone  (ALDACTONE ) 100 MG tablet TAKE 1/2 TABLET BY MOUTH DAILY AS NEEDED FOR SWELLING (Patient taking differently: Take 50 mg by mouth 3 (three) times a week.) 45 tablet 3 09/11/2024   gabapentin   (NEURONTIN ) 100 MG capsule Take 1 capsule (100 mg total) by mouth 3 (three) times daily. (Patient not taking: Reported on 09/08/2024) 90 capsule 3 Unknown   hydrocortisone  (ANUSOL -HC) 25 MG suppository Place 1 suppository (25 mg total) rectally 2 (two) times daily. Use as needed for hemorrhoids (Patient taking differently: Place 25 mg rectally 2 (two) times daily as needed for hemorrhoids.) 20 suppository 1 More than a month   Allergies  Allergen Reactions   Crestor  [Rosuvastatin ]     Shakes and tremors   Dilaudid  [Hydromorphone  Hcl]     vomiting   Morphine      hypotension    Social History   Tobacco Use   Smoking status: Never   Smokeless tobacco: Never  Substance Use Topics   Alcohol use: Yes    Comment: occasionally    Family History  Problem Relation Age of Onset   Depression Mother    Hyperlipidemia Mother    Hypertension Mother    Hypertension Father    Hyperlipidemia Father    Heart disease Father    Hearing loss Father    Diabetes Father    Depression Father    Rheum arthritis Father    Depression Sister    Alcohol abuse Brother    Depression Brother    Early death Brother    Heart attack Brother    Hypertension Brother    Hearing loss Maternal Grandmother    Diabetes Maternal Grandfather    Early death Maternal Grandfather    Heart disease Maternal Grandfather    Alcohol abuse Paternal Grandmother    Early death Paternal Grandmother    Hyperlipidemia Daughter    Anxiety disorder Son    Wilson's disease Neg Hx    Colon cancer Neg Hx    Stomach cancer Neg Hx    Esophageal cancer Neg Hx    Rectal cancer Neg Hx      Objective:   Patient Vitals for the past 8 hrs:  BP Temp Temp src Pulse Resp SpO2 Height Weight  09/15/24 1007 124/79 97.7 F (36.5 C) Oral (!) 105 20 100 % 5' 2 (1.575 m) 59 kg   No intake/output data recorded. No intake/output data recorded.  MRI lumbar spine dated 06/08/2024 was personally reviewed.  This demonstrates grade 2  spondylolisthesis L4-5 with associated likely left L4 pars defect.  There is broad uncovered disc, and bilateral facet arthropathy with a component of ligamentous hypertrophy that contributes to severe central and lateral recess.  There is also bilateral left greater than right foraminal stenosis.  Awake, alert, oriented Speech fluent, appropriate CN grossly intact 5/5 BUE/BLE    Assessment:   73 year old woman with chronic back and bilateral left greater than right leg pain related to grade 2 spondylolisthesis at L4-5 with associated stenosis.  Unfortunately she has not had lasting improvement with extensive conservative treatment and  has significant reduction in function due to pain.  Plan:   - will plan on proceeding with posterior lumbar interbody fusion L4-5 including placement interbody cages, interbody arthrodesis, posterior nonsegmental instrumentation with use of bone autograft and allograft. We discussed the details of the procedure as well as the expected postoperative course and recovery.  We discussed goals of the operation being pain reduction and improvement in function as well as the likelihood of achieving these goals.  Specific risks of this procedure were discussed in detail including but not limited to the risk of vascular injury which could lead to potentially life-threatening bleeding, nerve injury leading to leg, foot, bowel or bladder dysfunction, infection, and CSF leak.  General risks of anesthesia including heart attack, stroke, and blood clots were also discussed.  The patient appeared to understand our discussion, and all their questions today were answered.

## 2024-09-15 NOTE — Progress Notes (Signed)
 Orthopedic Tech Progress Note Patient Details:  Danielle Fitzpatrick Sep 04, 1951 [id number]  Ortho Devices Type of Ortho Device: Lumbar corsett Ortho Device/Splint Location: delivered to 3C Ortho Device/Splint Interventions: Ordered      Danielle Fitzpatrick 09/15/2024, 7:49 PM

## 2024-09-15 NOTE — Anesthesia Procedure Notes (Signed)
 Procedure Name: Intubation Date/Time: 09/15/2024 1:11 PM  Performed by: Harrold Macintosh, CRNAPre-anesthesia Checklist: Patient identified, Emergency Drugs available, Suction available and Patient being monitored Patient Re-evaluated:Patient Re-evaluated prior to induction Oxygen Delivery Method: Circle system utilized Preoxygenation: Pre-oxygenation with 100% oxygen Induction Type: IV induction Ventilation: Mask ventilation without difficulty Laryngoscope Size: Miller and 2 Grade View: Grade I Tube type: Oral Tube size: 7.0 mm Number of attempts: 1 Airway Equipment and Method: Stylet and Bite block Placement Confirmation: ETT inserted through vocal cords under direct vision, positive ETCO2 and breath sounds checked- equal and bilateral Secured at: 21 cm Tube secured with: Tape Dental Injury: Teeth and Oropharynx as per pre-operative assessment

## 2024-09-15 NOTE — Op Note (Signed)
 NEUROSURGERY OPERATIVE NOTE   PREOP DIAGNOSIS:  1. Isthmic Spondylolisthesis, L4-5  POSTOP DIAGNOSIS: Same  PROCEDURE: 1. Gill Procedure, L4-5 including laminectomy with radical facetectomy for decompression of exiting nerve roots, more than would be required for placement of interbody graft 2. Placement of anterior interbody device - Medtronic 8mm x 25mm banana cage 3. Posterior non-segmental instrumentation using cortical pedicle screws at L4 - L5 4. Interbody arthrodesis, L4-5 5. Use of locally harvested bone autograft 6. Use of non-structural bone allograft - autograft, allograft, BMP  SURGEON: Dr. Gerldine Maizes, MD  ASSISTANT: Camie Pickle, PA-C  ANESTHESIA: General Endotracheal  EBL: 300cc  SPECIMENS: None  DRAINS: None  COMPLICATIONS: None immediate  CONDITION: Hemodynamically stable to PACU  HISTORY: Danielle Fitzpatrick is a 73 y.o. female who has been followed in the outpatient clinic with back and leg pain related to lytic grade 2 spondylolisthesis at L4-5. Multiple conservative treatments were attempted without significant improvement and we ultimately elected to proceed with surgical decompression and fusion. Risks, benefits, and alternative treatments were reviewed in detail in the office. After all questions were answered, informed consent was obtained and witnessed.  PROCEDURE IN DETAIL: The patient was brought to the operating room via stretcher. After induction of general anesthesia, the patient was positioned on the operative table in the prone position. All pressure points were meticulously padded. Incision was then marked out and prepped and draped in the usual sterile fashion.  After timeout was conducted, skin was infiltrated with local anesthetic.  A spinal needle was introduced in order to identify the surface projection of the L4-5 interspace.  Skin incision was then made sharply and Bovie electrocautery was used to dissect the subcutaneous tissue  until the lumbodorsal fascia was identified and incised. The muscle was then elevated in the subperiosteal plane and the L4 lamina and L4-5 facet complexes were identified. Self-retaining retractors were then placed. Lateral fluoroscopy was taken with a dissector in the L4 5 interspace to confirm our location.  There did appear to be a pars defect certainly on the right, and likely on the left as well.  At this point attention was turned to decompression. Complete L4 laminectomy was completed with a high-speed drill and Kerrison punches.  Normal dura was identified.  A ball-tipped dissector was then used to identify the foramina bilaterally.  High-speed drill was used to cut across the pars interarticularis and the inferior articulating process of 4 was removed bilaterally.  The ligamentum flavum was densely adherent to the dorsal aspect of the dura and took a significant amount of careful dissection in order to removed.  I then used Kerrison punches to remove the medial and superior aspect of the superior articulating process of L5.  In this manner, the L4-5 foramen was completely unroofed in order to fully decompress the exiting L4 nerve roots.  Disc space was then identified, and I did note a significant grade 2 spondylolisthesis at this level.  Under lateral fluoroscopic guidance, the disc space was incised on the patient's left and a small 6 mm distractor was introduced.  Again under fluoroscopic guidance, a 6 mm shaver was introduced.  I was then able to remove fragments of desiccated disc.  Sequential shavers were then used up to 8 mm and complete discectomy was completed.  Endplates were prepared with curettes.  A 8 mm trial was then introduced and used to select a short 8 mm cage which was filled with morselized bone autograft mixed with DBM allograft and proteios.  Under lateral and AP fluoroscopic guidance, the cage was inserted into the L4-5 disc space.  At this point, the entry points for  bilateral L4 and L5 cortical pedicle screws were identified using standard anatomic landmarks and lateral fluoro. Pilot holes were then drilled and tapped to 6.5 x 40 mm at L4 and 6.5 x 35 mm at L5. Screws were then placed in L4 and L5. Prebent lordotic rod was then sized and placed into the pedicle screws. Set screws were placed and final tightened. Final AP and lateral fluoroscopic images confirmed good position.  Hemostasis was secured and confirmed with bipolar cautery and morcellized gelfoam with thrombin. The wound was then irrigated with copious amounts of antibiotic saline, then closed in standard fashion using a combination of interrupted 0 and 3-0 Vicryl stitches in the muscular, fascial, and subcutaneous layers. Skin was then closed using standard Dermabond. Sterile dressing was then applied. The patient was then transferred to the stretcher, extubated, and taken to the postanesthesia care unit in stable hemodynamic condition.  At the end of the case all sponge, needle, cottonoid, and instrument counts were correct.   Gerldine Maizes, MD [facility]

## 2024-09-15 NOTE — H&P (Signed)
 Chief Complaint   Back and leg pain  History of Present Illness    Danielle Fitzpatrick is a 73 year old woman seen for initial consultation at the request of Dr. Juliene Bailey, MD. She is referred for evaluation of now chronic back and left greater than right leg pain. While she does report some chronic back pain for many years, her current complaints started several months ago and have been progressively worsening. She describes relatively severe pain across her lower back, with radiation into the buttocks, down the back of the thigh, and into the anterolateral aspect of the calf/shin. The left leg appears to be much worse than the right. Pain is relatively constant, and significantly limits her ability to perform any activities. She was, prior to onset of pain, relatively active with hiking, as well as yoga. Since this started, the patient has also undergone a knee replacement. She has been through some physical therapy, although to be fairly mostly for her knee. She has also been on relatively high doses of OTC anti-inflammatories including Tylenol  and Motrin which provide perhaps mild improvement in symptoms. She has undergone a series of injections both by Dr. Bonner and more recently by Dr. Darlis. Her last injection was at about a month ago at which time she underwent bilateral L4-5 transforaminal steroid injection. She reports more than 50% improvement in her symptoms, unfortunately this only lasted for a few weeks. She is reporting complete recurrence of symptoms at this point. Of note, she does not report any changes in bladder function.    Past Medical History   Past Medical History:  Diagnosis Date   Allergy    Anxiety    Arthritis    Chicken pox    Depression    Diverticulitis    Diverticulosis    Hyperlipidemia     Past Surgical History   Past Surgical History:  Procedure Laterality Date   AUGMENTATION MAMMAPLASTY     LAPAROTOMY N/A 10/06/2023   Procedure: EXPLORATORY LAPAROTOMY;   Surgeon: Paola Dreama SAILOR, MD;  Location: [facility] OR;  Service: General;  Laterality: N/A;    Social History   Social History   Tobacco Use   Smoking status: Never   Smokeless tobacco: Never  Vaping Use   Vaping status: Never Used  Substance Use Topics   Alcohol use: Yes    Comment: occasionally   Drug use: No    Medications   Prior to Admission medications   Medication Sig Start Date End Date Taking? Authorizing Provider  aspirin EC 81 MG tablet Take 81 mg by mouth in the morning. 10/20/12  Yes [provider]  B Complex Vitamins (VITAMIN-B COMPLEX PO) Take 1 capsule by mouth in the morning.   Yes [provider]  Biotin 89999 MCG TABS Take 1 tablet by mouth in the morning.   Yes [provider]  buPROPion  (WELLBUTRIN  XL) 150 MG 24 hr tablet TAKE 3 TABLETS BY MOUTH DAILY 08/10/24  Yes Copland, Jessica C, MD  carboxymethylcellulose (REFRESH PLUS) 0.5 % SOLN Place 1 drop into both eyes 3 (three) times daily as needed (dry/irritated eyes.).   Yes [provider]  cetirizine (ZYRTEC) 10 MG tablet Take 10 mg by mouth in the morning.   Yes [provider]  Cholecalciferol (VITAMIN D3) 50 MCG (2000 UT) TABS Take 2,000 Units by mouth in the morning.   Yes [provider]  linaclotide (LINZESS) 72 MCG capsule Take 72 mcg by mouth daily before breakfast.   Yes [provider]  Magnesium 200 MG TABS Take 200 mg by mouth in the morning.   Yes [provider]  Misc Natural Products (OSTEO BI-FLEX TRIPLE STRENGTH PO) Take 1 tablet by mouth in the morning and at bedtime.   Yes [provider]  spironolactone  (ALDACTONE ) 100 MG tablet TAKE 1/2 TABLET BY MOUTH DAILY AS NEEDED FOR SWELLING Patient taking differently: Take 50 mg by mouth 3 (three) times a week. 02/22/24  Yes Copland, Harlene BROCKS, MD  gabapentin  (NEURONTIN ) 100 MG capsule Take 1 capsule (100 mg total) by mouth 3 (three) times daily. Patient not taking: Reported  on 09/08/2024 06/17/24   Copland, Harlene BROCKS, MD  hydrocortisone  (ANUSOL -HC) 25 MG suppository Place 1 suppository (25 mg total) rectally 2 (two) times daily. Use as needed for hemorrhoids Patient taking differently: Place 25 mg rectally 2 (two) times daily as needed for hemorrhoids. 08/17/24   Copland, Harlene BROCKS, MD    Allergies   Allergies  Allergen Reactions   Crestor  [Rosuvastatin ]     Shakes and tremors   Dilaudid  [Hydromorphone  Hcl]     vomiting   Morphine      hypotension    Review of Systems  ROS  Neurologic Exam  Awake, alert, oriented Memory and concentration grossly intact Speech fluent, appropriate CN grossly intact Motor exam: Upper Extremities Deltoid Bicep Tricep Grip  Right 5/5 5/5 5/5 5/5  Left 5/5 5/5 5/5 5/5   Lower Extremities IP Quad PF DF EHL  Right 5/5 5/5 5/5 5/5 5/5  Left 5/5 5/5 5/5 5/5 5/5   Sensation grossly intact to LT  Imaging  MRI L-spine reveals Grade 1/2 L45 spondy with associated stenosis and pars defects  Impression  - 73 y.o. female with back and leg pain related to lytic L4-5 spondy and associated stenosis, having failed conservative treatments  Plan  - Will proceed with L4-5 PLIF  I have reviewed the indications for the procedure as well as the details of the procedure and the expected postoperative course and recovery at length with the patient in the office. We have also reviewed in detail the risks, benefits, and alternatives to the procedure. All questions were answered and Danielle Fitzpatrick provided informed consent to proceed.  Gerldine Maizes, MD [facility]

## 2024-09-15 NOTE — Anesthesia Preprocedure Evaluation (Addendum)
 Anesthesia Evaluation  Patient identified by MRN, date of birth, ID band Patient awake    Reviewed: Allergy & Precautions, H&P , NPO status , Patient's Chart, lab work & pertinent test results  Airway Mallampati: II  TM Distance: >3 FB Neck ROM: Full    Dental no notable dental hx. (+) Teeth Intact, Dental Advisory Given   Pulmonary neg pulmonary ROS   Pulmonary exam normal breath sounds clear to auscultation       Cardiovascular negative cardio ROS  Rhythm:Regular Rate:Normal     Neuro/Psych   Anxiety Depression    negative neurological ROS     GI/Hepatic negative GI ROS, Neg liver ROS,,,  Endo/Other  negative endocrine ROS    Renal/GU negative Renal ROS  negative genitourinary   Musculoskeletal  (+) Arthritis , Osteoarthritis,    Abdominal   Peds  Hematology negative hematology ROS (+)   Anesthesia Other Findings   Reproductive/Obstetrics negative OB ROS                              Anesthesia Physical Anesthesia Plan  ASA: 2  Anesthesia Plan: General   Post-op Pain Management: Tylenol  PO (pre-op)*   Induction: Intravenous  PONV Risk Score and Plan: 4 or greater and Ondansetron , Dexamethasone  and Treatment may vary due to age or medical condition  Airway Management Planned: Oral ETT  Additional Equipment:   Intra-op Plan:   Post-operative Plan: Extubation in OR  Informed Consent: I have reviewed the patients History and Physical, chart, labs and discussed the procedure including the risks, benefits and alternatives for the proposed anesthesia with the patient or authorized representative who has indicated his/her understanding and acceptance.     Dental advisory given  Plan Discussed with: CRNA  Anesthesia Plan Comments:          Anesthesia Quick Evaluation

## 2024-09-16 DIAGNOSIS — M4316 Spondylolisthesis, lumbar region: Secondary | ICD-10-CM | POA: Diagnosis not present

## 2024-09-16 MED ORDER — METHOCARBAMOL 500 MG PO TABS: 500.0000 mg | ORAL_TABLET | Freq: Four times a day (QID) | ORAL | 2 refills | Status: AC | PRN

## 2024-09-16 MED ORDER — HYDROCODONE-ACETAMINOPHEN 5-325 MG PO TABS: 1.0000 | ORAL_TABLET | ORAL | 0 refills | Status: AC | PRN

## 2024-09-16 NOTE — Progress Notes (Signed)
 Patient awaiting family for discharge home, Patient in no acute distress nor complaints of pain nor discomfort; incision on back is clean, dry and intact; No c/o pain at this time. Room was checked and accounted for all patient's belongings; discharge instructions concerning her medications, incision care, follow up appointment and when to call the doctor as needed were all discussed with patient and son by RN and they both expressed understanding on the instructions given

## 2024-09-16 NOTE — Discharge Summary (Signed)
 Patient ID: Danielle Fitzpatrick MRN: [id number] DOB/AGE: 30-[name]-1951 73 y.o.  Admit date: 09/15/2024 Discharge date: 09/16/2024  Admission Diagnoses: Spondylolisthesis, lumbar region [M43.16]   Discharge Diagnoses: Same   Discharged Condition: Stable  Hospital Course:  Danielle Fitzpatrick is a 73 y.o. female who was admitted following an uncomplicated L4-5 PLIF. They were recovered in PACU and transferred to the floor. Hospital course was uncomplicated. Pt stable for discharge today. Pt to f/u in office for routine post op visit. Pt is in agreement w/ plan.    Discharge Exam: Blood pressure (!) 109/59, pulse 98, temperature 97.7 F (36.5 C), temperature source Oral, resp. rate 19, height 5' 2 (1.575 m), weight 59 kg, SpO2 98%. A&O Speech fluent, appropriate Strength grossly intact BUE/BLE.  SILTx4.  Dressing c/d/I.   Disposition: Discharge disposition: 01-Home or Self Care       Discharge Instructions     If the dressing is still on your incision site when you go home, remove it on the third day after your surgery date. Remove dressing if it begins to fall off, or if it is dirty or damaged before the third day.   Complete by: As directed    Incentive spirometry RT   Complete by: As directed       Allergies as of 09/16/2024       Reactions   Crestor  [rosuvastatin ]    Shakes and tremors   Dilaudid  [hydromorphone  Hcl]    vomiting   Morphine     hypotension        Medication List     PAUSE taking these medications    aspirin EC 81 MG tablet Wait to take this until: September 22, 2024 Take 81 mg by mouth in the morning.       TAKE these medications    Biotin 89999 MCG Tabs Take 1 tablet by mouth in the morning.   buPROPion  150 MG 24 hr tablet Commonly known as: WELLBUTRIN  XL TAKE 3 TABLETS BY MOUTH DAILY   carboxymethylcellulose 0.5 % Soln Commonly known as: REFRESH PLUS Place 1 drop into both eyes 3 (three) times daily as needed (dry/irritated  eyes.).   cetirizine 10 MG tablet Commonly known as: ZYRTEC Take 10 mg by mouth in the morning.   gabapentin  100 MG capsule Commonly known as: NEURONTIN  Take 1 capsule (100 mg total) by mouth 3 (three) times daily.   HYDROcodone-acetaminophen  5-325 MG tablet Commonly known as: NORCO/VICODIN Take 1 tablet by mouth every 4 (four) hours as needed for moderate pain (pain score 4-6).   hydrocortisone  25 MG suppository Commonly known as: ANUSOL -HC Place 1 suppository (25 mg total) rectally 2 (two) times daily. Use as needed for hemorrhoids What changed:  when to take this reasons to take this additional instructions   linaclotide 72 MCG capsule Commonly known as: LINZESS Take 72 mcg by mouth daily before breakfast.   Magnesium 200 MG Tabs Take 200 mg by mouth in the morning.   methocarbamol  500 MG tablet Commonly known as: ROBAXIN  Take 1 tablet (500 mg total) by mouth every 6 (six) hours as needed for muscle spasms.   OSTEO BI-FLEX TRIPLE STRENGTH PO Take 1 tablet by mouth in the morning and at bedtime.   spironolactone  100 MG tablet Commonly known as: ALDACTONE  TAKE 1/2 TABLET BY MOUTH DAILY AS NEEDED FOR SWELLING What changed: See the new instructions.   Vitamin D3 50 MCG (2000 UT) Tabs Take 2,000 Units by mouth in the morning.   VITAMIN-B COMPLEX PO  Take 1 capsule by mouth in the morning.               Discharge Care Instructions  (From admission, onward)           Start     Ordered   09/16/24 0000  If the dressing is still on your incision site when you go home, remove it on the third day after your surgery date. Remove dressing if it begins to fall off, or if it is dirty or damaged before the third day.        09/16/24 1025             Signed: CAMIE CAYLIN [name] 09/16/2024, 10:25 AM

## 2024-09-16 NOTE — Plan of Care (Signed)

## 2024-09-16 NOTE — Discharge Instructions (Signed)
 Wound Care Remove outer dressing. Leave incision open to air. You may shower. Do not scrub directly on incision.  Do not put any creams, lotions, or ointments on incision.  Activity Walk each and every day, increasing distance each day. No lifting greater than 8 lbs.  Avoid bending, arching, and twisting. No driving for 2 weeks; may ride as a passenger locally. If provided with back brace, wear when out of bed.  It is not necessary to wear in bed. Diet Resume your normal diet.   Call Your Doctor If Any of These Occur Redness, drainage, or swelling at the wound.  Temperature greater than 101 degrees. Severe pain not relieved by pain medication. Incision starts to come apart. Follow Up Appt Call today for appointment in 2 weeks ([phone]) or for problems.  If you have any hardware placed in your spine, you will need an x-ray before your appointment.

## 2024-09-16 NOTE — Evaluation (Signed)
 Occupational Therapy Evaluation Patient Details Name: Danielle Fitzpatrick MRN: [id number] DOB: [birth year] Today's Date: 09/16/2024   History of Present Illness   Pt is a 73 y/o female presenting on 09/15/24 for PLIF L4-5. PMH includes: anxiety, depression, exploratory laparotomy, diverticulitis, HLD     Clinical Impressions Patient admitted for above and presents with problem list below.  PTA pt was independent and driving. Patient was educated on brace mgmt and wear schedule, back precautions, ADL compensatory techniques, AE/DME, mobility progression, safety and recommendations.  Today, pt demonstrated ability to complete bed mobility with modified independence, transfers using no AD with independence, functional mobility using no AD with modified independence, and ADLs with up to modified independence.  At discharge, pt will have support from son as needed as dc.  Based on performance today, no further OT needs identified.  OT will sign off.       If plan is discharge home, recommend the following:   Assistance with cooking/housework;Assist for transportation     Functional Status Assessment         Equipment Recommendations   None recommended by OT     Recommendations for Other Services         Precautions/Restrictions   Precautions Precautions: Back Precaution Booklet Issued: Yes (comment) Recall of Precautions/Restrictions: Intact Precaution/Restrictions Comments: good adherance Required Braces or Orthoses: Spinal Brace Spinal Brace: Lumbar corset;Applied in sitting position Restrictions Weight Bearing Restrictions Per Provider Order: No     Mobility Bed Mobility Overal bed mobility: Modified Independent             General bed mobility comments: log roll technique without cueing    Transfers Overall transfer level: Independent                        Balance Overall balance assessment: No apparent balance deficits (not formally  assessed)                                         ADL either performed or assessed with clinical judgement   ADL Overall ADL's : Modified independent                                       General ADL Comments: after education of back precautions and compenstory techniques     Vision   Vision Assessment?: No apparent visual deficits     Perception         Praxis         Pertinent Vitals/Pain Pain Assessment Pain Assessment: No/denies pain     Extremity/Trunk Assessment Upper Extremity Assessment Upper Extremity Assessment: Overall WFL for tasks assessed   Lower Extremity Assessment Lower Extremity Assessment: Overall WFL for tasks assessed   Cervical / Trunk Assessment Cervical / Trunk Assessment: Back Surgery   Communication Communication Communication: No apparent difficulties   Cognition Arousal: Alert Behavior During Therapy: WFL for tasks assessed/performed Cognition: No apparent impairments                               Following commands: Intact       Cueing  General Comments   Cueing Techniques: Verbal cues  son present and supportive   Exercises     Shoulder  Instructions      Home Living Family/patient expects to be discharged to:: Private residence Living Arrangements: Alone Available Help at Discharge: Family;Available 24 hours/day (son will be staying with her) Type of Home: House Home Access: Level entry     Home Layout: One level     Bathroom Shower/Tub: Producer, television/film/video: Handicapped height     Home Equipment: Cane - single point;Shower Counsellor (2 wheels)          Prior Functioning/Environment Prior Level of Function : Independent/Modified Independent;Driving                    OT Problem List: Decreased knowledge of precautions;Decreased activity tolerance;Decreased knowledge of use of DME or AE   OT Treatment/Interventions:         OT Goals(Current goals can be found in the care plan section)   Acute Rehab OT Goals Patient Stated Goal: home OT Goal Formulation: With patient   OT Frequency:       Co-evaluation              AM-PAC OT 6 Clicks Daily Activity     Outcome Measure Help from another person eating meals?: None Help from another person taking care of personal grooming?: None Help from another person toileting, which includes using toliet, bedpan, or urinal?: None Help from another person bathing (including washing, rinsing, drying)?: None Help from another person to put on and taking off regular upper body clothing?: None Help from another person to put on and taking off regular lower body clothing?: None 6 Click Score: 24   End of Session Equipment Utilized During Treatment: Back brace Nurse Communication: Mobility status  Activity Tolerance: Patient tolerated treatment well Patient left: with call bell/phone within reach;with family/visitor present;Other (comment) (EOB)  OT Visit Diagnosis: Other abnormalities of gait and mobility (R26.89)                Time: 9099-9077 OT Time Calculation (min): 22 min Charges:  OT General Charges $OT Visit: 1 Visit OT Evaluation $OT Eval Low Complexity: 1 Low  Etta NOVAK, OT Acute Rehabilitation Services Office [phone] Secure Chat Preferred    Etta GORMAN Hope 09/16/2024, 9:48 AM

## 2024-09-16 NOTE — Evaluation (Signed)
 Physical Therapy Brief Evaluation and Discharge Note Patient Details Name: Danielle Fitzpatrick MRN: [id number] DOB: September 09, 1951 Today's Date: 09/16/2024   History of Present Illness  Pt is a 73 y/o female presenting on 09/15/24 for PLIF L4-5. PMH includes: anxiety, depression, exploratory laparotomy, diverticulitis, HLD  Clinical Impression  Patient evaluated by Physical Therapy with no further acute PT needs identified. All education has been completed and the patient has no further questions. Pt from home alone but her son will stay with her as needed initially. Pt was independent and active PTA until back pain began to limit her. Pt with good understanding of precautions and activity modification and activity level for d/c home. Ambulated without AD but has RW at home if needed if has a more painful day or feeling unsteady. Pt ready for d/c home from PT standpoint.   PT is signing off. Thank you for this referral.        PT Assessment Patient does not need any further PT services  Assistance Needed at Discharge  PRN    Equipment Recommendations None recommended by PT  Recommendations for Other Services       Precautions/Restrictions Precautions Precautions: Back Precaution Booklet Issued: Yes (comment) Recall of Precautions/Restrictions: Intact Precaution/Restrictions Comments: good adherance Required Braces or Orthoses: Spinal Brace Spinal Brace: Lumbar corset;Applied in sitting position Restrictions Weight Bearing Restrictions Per Provider Order: No        Mobility  Bed Mobility Rolling: Supervision Supine/Sidelying to sit: Supervision Sit to supine/sidelying: Supervision General bed mobility comments: vc's for log roll and sequencing  Transfers Overall transfer level: Independent Equipment used: None               General transfer comment: safe with STS    Ambulation/Gait Ambulation/Gait assistance: Supervision Gait Distance (Feet): 300 Feet Assistive  device: None Gait Pattern/deviations: Step-through pattern, Decreased stride length Gait Speed: Below normal General Gait Details: guarded gait. Pt holds son's arm when feeling unsteady. Discussed using RW at home when tired or with increased pain or unsteadiness  Home Activity Instructions Home Activity Instructions: education given on proper posture, activity modifications, and activity level  Stairs            Modified Rankin (Stroke Patients Only)        Balance Overall balance assessment: Mild deficits observed, not formally tested Sitting-balance support: No upper extremity supported, Feet supported Sitting balance-Leahy Scale: Good     Standing balance support: No upper extremity supported, During functional activity Standing balance-Leahy Scale: Fair            Pertinent Vitals/Pain PT - Brief Vital Signs All Vital Signs Stable: Yes Pain Assessment Pain Assessment: Faces Faces Pain Scale: Hurts little more Pain Location: back, incision Pain Descriptors / Indicators: Operative site guarding, Sore Pain Intervention(s): Limited activity within patient's tolerance, Monitored during session, Premedicated before session     Home Living Family/patient expects to be discharged to:: Private residence Living Arrangements: Alone Available Help at Discharge: Family;Available PRN/intermittently Home Environment: Level entry   Home Equipment: Cane - single point;Shower Counsellor (2 wheels)   Additional Comments: son to stay with her initially    Prior Function Level of Independence: Independent Comments: active at baseline with community based exercise classes but has been limited by pain recently    UE/LE Assessment   UE ROM/Strength/Tone/Coordination: 62 (old L shoulder injury)    LE ROM/Strength/Tone/Coordination: [facility]      Communication   Communication Communication: No apparent difficulties  Cognition Overall Cognitive Status:  Appears within functional limits for tasks assessed/performed       General Comments General comments (skin integrity, edema, etc.): son present for education    Exercises     Assessment/Plan    PT Problem List         PT Visit Diagnosis Difficulty in walking, not elsewhere classified (R26.2);Pain    No Skilled PT All education completed;Patient will have necessary level of assist by caregiver at discharge;Patient is modified independent with all activity/mobility   Co-evaluation                AMPAC 6 Clicks Help needed turning from your back to your side while in a flat bed without using bedrails?: None Help needed moving from lying on your back to sitting on the side of a flat bed without using bedrails?: None Help needed moving to and from a bed to a chair (including a wheelchair)?: None Help needed standing up from a chair using your arms (e.g., wheelchair or bedside chair)?: None Help needed to walk in hospital room?: None Help needed climbing 3-5 steps with a railing? : None 6 Click Score: 24      End of Session Equipment Utilized During Treatment: Gait belt Activity Tolerance: Patient tolerated treatment well Patient left: in bed;with call bell/phone within reach;with family/visitor present Nurse Communication: Mobility status PT Visit Diagnosis: Difficulty in walking, not elsewhere classified (R26.2);Pain Pain - part of body:  (back)     Time: 1005-1031 PT Time Calculation (min) (ACUTE ONLY): 26 min  Charges:   PT Evaluation $PT Eval Low Complexity: 1 Low PT Treatments $Gait Training: 8-22 mins    Richerd Lipoma, PT  Acute Rehab Services Secure chat preferred Office [phone]   Richerd L Jebidiah Baggerly  09/16/2024, 11:15 AM

## 2024-10-22 ENCOUNTER — Other Ambulatory Visit: Payer: Self-pay | Admitting: Family Medicine

## 2024-10-28 ENCOUNTER — Encounter: Payer: Self-pay | Admitting: Family Medicine

## 2024-10-28 MED ORDER — VALACYCLOVIR HCL 1 G PO TABS: ORAL_TABLET | ORAL | 5 refills | Status: AC

## 2024-12-26 ENCOUNTER — Encounter (INDEPENDENT_AMBULATORY_CARE_PROVIDER_SITE_OTHER): Payer: Self-pay | Admitting: Family Medicine

## 2024-12-26 DIAGNOSIS — K5792 Diverticulitis of intestine, part unspecified, without perforation or abscess without bleeding: Secondary | ICD-10-CM | POA: Diagnosis not present

## 2024-12-26 MED ORDER — AMOXICILLIN-POT CLAVULANATE 875-125 MG PO TABS: 1.0000 | ORAL_TABLET | Freq: Two times a day (BID) | ORAL | 0 refills | Status: AC

## 2024-12-26 MED ORDER — FLUCONAZOLE 150 MG PO TABS: 150.0000 mg | ORAL_TABLET | Freq: Once | ORAL | 0 refills | Status: AC

## 2024-12-26 NOTE — Telephone Encounter (Signed)
